# Patient Record
Sex: Female | Born: 1990 | Race: White | Hispanic: No | Marital: Married | Smoking: Never smoker
Health system: Southern US, Community
[De-identification: ages and names within clinical notes are randomized; demographics above are authoritative.]

## PROBLEM LIST (undated history)

## (undated) ENCOUNTER — Inpatient Hospital Stay (HOSPITAL_COMMUNITY): Payer: Self-pay

## (undated) DIAGNOSIS — T7840XA Allergy, unspecified, initial encounter: Secondary | ICD-10-CM

## (undated) DIAGNOSIS — F329 Major depressive disorder, single episode, unspecified: Secondary | ICD-10-CM

## (undated) DIAGNOSIS — F32A Depression, unspecified: Secondary | ICD-10-CM

## (undated) DIAGNOSIS — J45909 Unspecified asthma, uncomplicated: Secondary | ICD-10-CM

## (undated) DIAGNOSIS — R87629 Unspecified abnormal cytological findings in specimens from vagina: Secondary | ICD-10-CM

## (undated) DIAGNOSIS — B019 Varicella without complication: Secondary | ICD-10-CM

## (undated) DIAGNOSIS — R569 Unspecified convulsions: Secondary | ICD-10-CM

## (undated) DIAGNOSIS — R87619 Unspecified abnormal cytological findings in specimens from cervix uteri: Secondary | ICD-10-CM

## (undated) DIAGNOSIS — M1990 Unspecified osteoarthritis, unspecified site: Secondary | ICD-10-CM

## (undated) DIAGNOSIS — F419 Anxiety disorder, unspecified: Secondary | ICD-10-CM

## (undated) DIAGNOSIS — IMO0002 Reserved for concepts with insufficient information to code with codable children: Secondary | ICD-10-CM

## (undated) DIAGNOSIS — Z8619 Personal history of other infectious and parasitic diseases: Secondary | ICD-10-CM

## (undated) HISTORY — DX: Allergy, unspecified, initial encounter: T78.40XA

## (undated) HISTORY — DX: Reserved for concepts with insufficient information to code with codable children: IMO0002

## (undated) HISTORY — DX: Unspecified osteoarthritis, unspecified site: M19.90

## (undated) HISTORY — PX: GYNECOLOGIC CRYOSURGERY: SHX857

## (undated) HISTORY — DX: Varicella without complication: B01.9

## (undated) HISTORY — DX: Unspecified asthma, uncomplicated: J45.909

## (undated) HISTORY — DX: Personal history of other infectious and parasitic diseases: Z86.19

## (undated) HISTORY — DX: Unspecified convulsions: R56.9

## (undated) HISTORY — DX: Unspecified abnormal cytological findings in specimens from vagina: R87.629

## (undated) HISTORY — DX: Depression, unspecified: F32.A

## (undated) HISTORY — DX: Unspecified abnormal cytological findings in specimens from cervix uteri: R87.619

## (undated) HISTORY — DX: Anxiety disorder, unspecified: F41.9

## (undated) HISTORY — DX: Major depressive disorder, single episode, unspecified: F32.9

---
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
## 2010-01-02 ENCOUNTER — Encounter: Admission: RE | Admit: 2010-01-02 | Discharge: 2010-01-02 | Payer: Self-pay | Admitting: Emergency Medicine

## 2010-01-04 ENCOUNTER — Ambulatory Visit (HOSPITAL_COMMUNITY): Admission: RE | Admit: 2010-01-04 | Discharge: 2010-01-04 | Payer: Self-pay | Admitting: Emergency Medicine

## 2011-04-02 ENCOUNTER — Ambulatory Visit (INDEPENDENT_AMBULATORY_CARE_PROVIDER_SITE_OTHER): Payer: 59

## 2011-04-02 DIAGNOSIS — S93409A Sprain of unspecified ligament of unspecified ankle, initial encounter: Secondary | ICD-10-CM

## 2012-03-02 LAB — OB RESULTS CONSOLE ANTIBODY SCREEN: Antibody Screen: NEGATIVE

## 2012-03-02 LAB — OB RESULTS CONSOLE GC/CHLAMYDIA: Gonorrhea: NEGATIVE

## 2012-03-02 LAB — OB RESULTS CONSOLE ABO/RH

## 2012-03-02 LAB — OB RESULTS CONSOLE HIV ANTIBODY (ROUTINE TESTING): HIV: NONREACTIVE

## 2012-04-12 ENCOUNTER — Other Ambulatory Visit: Payer: Self-pay | Admitting: Physician Assistant

## 2012-04-12 NOTE — Telephone Encounter (Signed)
Please pull chart.

## 2012-04-13 NOTE — Telephone Encounter (Signed)
Chart pulled at [place] [id number]

## 2012-04-13 NOTE — Telephone Encounter (Signed)
Sent 1 epipen to pharmacy.  Pt needs office visit, last seen over 1 yr ago for acute visit

## 2012-09-28 LAB — OB RESULTS CONSOLE GBS: GBS: NEGATIVE

## 2012-10-04 ENCOUNTER — Encounter (HOSPITAL_COMMUNITY): Payer: Self-pay | Admitting: *Deleted

## 2012-10-04 ENCOUNTER — Telehealth (HOSPITAL_COMMUNITY): Payer: Self-pay | Admitting: *Deleted

## 2012-10-04 NOTE — Telephone Encounter (Signed)
Preadmission screen  

## 2012-10-07 ENCOUNTER — Inpatient Hospital Stay (HOSPITAL_COMMUNITY)
Admission: RE | Admit: 2012-10-07 | Discharge: 2012-10-09 | DRG: 373 | Disposition: A | Discharge status: Home / Self Care | Payer: BC Managed Care – PPO | Source: Ambulatory Visit | Attending: Obstetrics and Gynecology | Admitting: Obstetrics and Gynecology

## 2012-10-07 LAB — CBC
HCT: 32.5 % — ABNORMAL LOW (ref 36.0–46.0)
MCH: 31.2 pg (ref 26.0–34.0)
MCHC: 34.2 g/dL (ref 30.0–36.0)
RDW: 12.4 % (ref 11.5–15.5)

## 2012-10-07 LAB — ABO/RH: ABO/RH(D): A POS

## 2012-10-07 LAB — TYPE AND SCREEN: ABO/RH(D): A POS

## 2012-10-07 MED ORDER — ACETAMINOPHEN 325 MG PO TABS: 650.0000 mg | ORAL_TABLET | ORAL | Status: DC | PRN

## 2012-10-07 MED ORDER — FLEET ENEMA 7-19 GM/118ML RE ENEM: 1.0000 | ENEMA | RECTAL | Status: DC | PRN

## 2012-10-07 MED ORDER — ONDANSETRON HCL 4 MG/2ML IJ SOLN: 4.0000 mg | Freq: Four times a day (QID) | INTRAMUSCULAR | Status: DC | PRN

## 2012-10-07 MED ORDER — CITRIC ACID-SODIUM CITRATE 334-500 MG/5ML PO SOLN: 30.0000 mL | ORAL | Status: DC | PRN

## 2012-10-07 MED ORDER — LACTATED RINGERS IV SOLN
INTRAVENOUS | Status: DC
  Administered 2012-10-07 – 2012-10-08 (×3): via INTRAVENOUS

## 2012-10-07 MED ORDER — OXYTOCIN 40 UNITS IN LACTATED RINGERS INFUSION - SIMPLE MED
1.0000 m[IU]/min | INTRAVENOUS | Status: DC
  Administered 2012-10-07: 8 m[IU]/min via INTRAVENOUS
  Administered 2012-10-07: 2 m[IU]/min via INTRAVENOUS
  Administered 2012-10-07: 4 m[IU]/min via INTRAVENOUS
  Administered 2012-10-07: 1 m[IU]/min via INTRAVENOUS
  Administered 2012-10-07: 7 m[IU]/min via INTRAVENOUS
  Administered 2012-10-07: 6 m[IU]/min via INTRAVENOUS
  Administered 2012-10-07: 3 m[IU]/min via INTRAVENOUS
  Filled 2012-10-07: qty 1000

## 2012-10-07 MED ORDER — OXYCODONE-ACETAMINOPHEN 5-325 MG PO TABS: 1.0000 | ORAL_TABLET | ORAL | Status: DC | PRN

## 2012-10-07 MED ORDER — LACTATED RINGERS IV SOLN: 500.0000 mL | INTRAVENOUS | Status: DC | PRN

## 2012-10-07 MED ORDER — LIDOCAINE HCL (PF) 1 % IJ SOLN
30.0000 mL | INTRAMUSCULAR | Status: DC | PRN
Start: 2012-10-07 — End: 2012-10-09
  Filled 2012-10-07 (×2): qty 30

## 2012-10-07 MED ORDER — IBUPROFEN 600 MG PO TABS
600.0000 mg | ORAL_TABLET | Freq: Four times a day (QID) | ORAL | Status: DC | PRN
  Filled 2012-10-07 (×5): qty 1

## 2012-10-07 MED ORDER — OXYTOCIN BOLUS FROM INFUSION: 500.0000 mL | INTRAVENOUS | Status: DC

## 2012-10-07 MED ORDER — OXYTOCIN 40 UNITS IN LACTATED RINGERS INFUSION - SIMPLE MED
62.5000 mL/h | INTRAVENOUS | Status: DC
  Administered 2012-10-08: 62.5 mL/h via INTRAVENOUS

## 2012-10-07 MED ORDER — BUTORPHANOL TARTRATE 1 MG/ML IJ SOLN: 1.0000 mg | INTRAMUSCULAR | Status: DC | PRN

## 2012-10-07 MED ORDER — TERBUTALINE SULFATE 1 MG/ML IJ SOLN: 0.2500 mg | Freq: Once | INTRAMUSCULAR | Status: AC | PRN

## 2012-10-07 NOTE — Progress Notes (Signed)
FHT reactive UCs q2-3 min

## 2012-10-07 NOTE — Progress Notes (Signed)
Cx 2-3/80/-2/well applied vtx AROM clear FHT reactive UCs q65min

## 2012-10-07 NOTE — H&P (Signed)
JESLIE LOWE is a 22 y.o. female presenting for IOL at term. No HA/vision change, no epigastric pain, no leaking/bleeding. EFW on 10/04/12 = 7# 13oz. Maternal Medical History:  Fetal activity: Perceived fetal activity is normal.      OB History   Grav Para Term Preterm Abortions TAB SAB Ect Mult Living   2 1 1       1      Past Medical History  Diagnosis Date  . Abnormal Pap smear   . Hx of varicella   . Asthma   . Seizures     hx pseudotumor treated in 2011 with meds  . Depression   . Anxiety    Past Surgical History  Procedure Laterality Date  . Gynecologic cryosurgery     Family History: family history includes Diabetes in her father; Hypertension in her maternal grandfather; Other in her mother; and Rheum arthritis in her maternal grandmother. Social History:  reports that she has never smoked. She has never used smokeless tobacco. She reports that she does not drink alcohol or use illicit drugs.   Prenatal Transfer Tool  Maternal Diabetes: No Genetic Screening: Normal Maternal Ultrasounds/Referrals: Normal Fetal Ultrasounds or other Referrals:  None Maternal Substance Abuse:  No Significant Maternal Medications:  None Significant Maternal Lab Results:  None Other Comments:  None  Review of Systems  Eyes: Negative for blurred vision.  Gastrointestinal: Negative for abdominal pain.  Neurological: Positive for headaches.    Dilation: 2 Effacement (%): 50 Station: Ballotable Exam by:: Alexius Ellington MD Temperature 97.8 F (36.6 C), temperature source Oral, resp. rate 20, height 5\' 8"  (1.727 m), weight 254 lb (115.214 kg), last menstrual period 01/04/2012. Maternal Exam:  Uterine Assessment: Contraction strength is moderate.  Contraction frequency is irregular.   Abdomen: Fetal presentation: vertex     Fetal Exam Fetal Monitor Review: Pattern: accelerations present.       Physical Exam  Cardiovascular: Normal rate and regular rhythm.   Respiratory: Effort  normal and breath sounds normal.  GI: Soft. There is no tenderness.  Neurological: She has normal reflexes.   Cx  2/60/-3/vtx Prenatal labs: ABO, Rh: A/Positive/-- (12/11 0000) Antibody: Negative (12/11 0000) Rubella: Immune (12/11 0000) RPR: Nonreactive (12/11 0000)  HBsAg: Negative (12/11 0000)  HIV: Non-reactive (12/11 0000)  GBS:     Assessment/Plan: 22 yo G2P1 at term for IOL Will start pitocin , risks reviewed   Joann Jorge II,Param Capri E 10/07/2012, 8:23 AM

## 2012-10-07 NOTE — Progress Notes (Signed)
Cx 3/80/-2/vtx FHT reactive UCs q2-4, mild to palpation

## 2012-10-08 ENCOUNTER — Inpatient Hospital Stay (HOSPITAL_COMMUNITY): Payer: BC Managed Care – PPO | Admitting: Anesthesiology

## 2012-10-08 ENCOUNTER — Encounter (HOSPITAL_COMMUNITY): Payer: Self-pay

## 2012-10-08 ENCOUNTER — Encounter (HOSPITAL_COMMUNITY): Payer: Self-pay | Admitting: Anesthesiology

## 2012-10-08 MED ORDER — LACTATED RINGERS IV SOLN
500.0000 mL | Freq: Once | INTRAVENOUS | Status: AC
  Administered 2012-10-08: 500 mL via INTRAVENOUS

## 2012-10-08 MED ORDER — TETANUS-DIPHTH-ACELL PERTUSSIS 5-2.5-18.5 LF-MCG/0.5 IM SUSP: 0.5000 mL | Freq: Once | INTRAMUSCULAR | Status: DC

## 2012-10-08 MED ORDER — EPHEDRINE 5 MG/ML INJ
10.0000 mg | INTRAVENOUS | Status: DC | PRN
  Filled 2012-10-08: qty 2

## 2012-10-08 MED ORDER — LANOLIN HYDROUS EX OINT: TOPICAL_OINTMENT | CUTANEOUS | Status: DC | PRN

## 2012-10-08 MED ORDER — PRENATAL MULTIVITAMIN CH
1.0000 | ORAL_TABLET | Freq: Every day | ORAL | Status: DC
  Administered 2012-10-08 – 2012-10-09 (×2): 1 via ORAL
  Filled 2012-10-08 (×2): qty 1

## 2012-10-08 MED ORDER — EPHEDRINE 5 MG/ML INJ
10.0000 mg | INTRAVENOUS | Status: DC | PRN
  Filled 2012-10-08: qty 2
  Filled 2012-10-08: qty 4

## 2012-10-08 MED ORDER — LIDOCAINE HCL (PF) 1 % IJ SOLN
INTRAMUSCULAR | Status: DC | PRN
  Administered 2012-10-08 (×4): 4 mL

## 2012-10-08 MED ORDER — OXYCODONE-ACETAMINOPHEN 5-325 MG PO TABS: 1.0000 | ORAL_TABLET | ORAL | Status: DC | PRN

## 2012-10-08 MED ORDER — WITCH HAZEL-GLYCERIN EX PADS: 1.0000 "application " | MEDICATED_PAD | CUTANEOUS | Status: DC | PRN

## 2012-10-08 MED ORDER — PHENYLEPHRINE 40 MCG/ML (10ML) SYRINGE FOR IV PUSH (FOR BLOOD PRESSURE SUPPORT)
80.0000 ug | PREFILLED_SYRINGE | INTRAVENOUS | Status: DC | PRN
  Filled 2012-10-08: qty 2

## 2012-10-08 MED ORDER — DIPHENHYDRAMINE HCL 50 MG/ML IJ SOLN: 12.5000 mg | INTRAMUSCULAR | Status: DC | PRN

## 2012-10-08 MED ORDER — PHENYLEPHRINE 40 MCG/ML (10ML) SYRINGE FOR IV PUSH (FOR BLOOD PRESSURE SUPPORT)
80.0000 ug | PREFILLED_SYRINGE | INTRAVENOUS | Status: DC | PRN
  Filled 2012-10-08: qty 5
  Filled 2012-10-08: qty 2

## 2012-10-08 MED ORDER — ONDANSETRON HCL 4 MG/2ML IJ SOLN: 4.0000 mg | INTRAMUSCULAR | Status: DC | PRN

## 2012-10-08 MED ORDER — DIPHENHYDRAMINE HCL 25 MG PO CAPS: 25.0000 mg | ORAL_CAPSULE | Freq: Four times a day (QID) | ORAL | Status: DC | PRN

## 2012-10-08 MED ORDER — ONDANSETRON HCL 4 MG PO TABS: 4.0000 mg | ORAL_TABLET | ORAL | Status: DC | PRN

## 2012-10-08 MED ORDER — SIMETHICONE 80 MG PO CHEW: 80.0000 mg | CHEWABLE_TABLET | ORAL | Status: DC | PRN

## 2012-10-08 MED ORDER — ZOLPIDEM TARTRATE 5 MG PO TABS: 5.0000 mg | ORAL_TABLET | Freq: Every evening | ORAL | Status: DC | PRN

## 2012-10-08 MED ORDER — SENNOSIDES-DOCUSATE SODIUM 8.6-50 MG PO TABS
2.0000 | ORAL_TABLET | Freq: Every day | ORAL | Status: DC
  Administered 2012-10-08: 2 via ORAL

## 2012-10-08 MED ORDER — IBUPROFEN 600 MG PO TABS
600.0000 mg | ORAL_TABLET | Freq: Four times a day (QID) | ORAL | Status: DC
  Administered 2012-10-08 – 2012-10-09 (×5): 600 mg via ORAL

## 2012-10-08 MED ORDER — FENTANYL 2.5 MCG/ML BUPIVACAINE 1/10 % EPIDURAL INFUSION (WH - ANES)
14.0000 mL/h | INTRAMUSCULAR | Status: DC | PRN
  Administered 2012-10-08: 14 mL/h via EPIDURAL
  Filled 2012-10-08: qty 125

## 2012-10-08 MED ORDER — FLEET ENEMA 7-19 GM/118ML RE ENEM: 1.0000 | ENEMA | Freq: Every day | RECTAL | Status: DC | PRN

## 2012-10-08 MED ORDER — BENZOCAINE-MENTHOL 20-0.5 % EX AERO: 1.0000 "application " | INHALATION_SPRAY | CUTANEOUS | Status: DC | PRN

## 2012-10-08 MED ORDER — DIBUCAINE 1 % RE OINT: 1.0000 "application " | TOPICAL_OINTMENT | RECTAL | Status: DC | PRN

## 2012-10-08 MED ORDER — BISACODYL 10 MG RE SUPP: 10.0000 mg | Freq: Every day | RECTAL | Status: DC | PRN

## 2012-10-08 NOTE — Anesthesia Preprocedure Evaluation (Signed)
Anesthesia Evaluation  Patient identified by MRN, date of birth, ID band Patient awake    Reviewed: Allergy & Precautions, H&P , NPO status , Patient's Chart, lab work & pertinent test results, reviewed documented beta blocker date and time   History of Anesthesia Complications Negative for: history of anesthetic complications  Airway Mallampati: I TM Distance: >3 FB Neck ROM: full    Dental  (+) Teeth Intact   Pulmonary  breath sounds clear to auscultation        Cardiovascular negative cardio ROS  - dysrhythmias Rhythm:regular Rate:Normal     Neuro/Psych PSYCHIATRIC DISORDERS (depression, anxiety) H/o pseudotumor cerebri - no problems in 2 years    GI/Hepatic negative GI ROS, Neg liver ROS,   Endo/Other  BMI 38.7  Renal/GU negative Renal ROS     Musculoskeletal   Abdominal   Peds  Hematology negative hematology ROS (+)   Anesthesia Other Findings   Reproductive/Obstetrics (+) Pregnancy                           Anesthesia Physical Anesthesia Plan  ASA: II  Anesthesia Plan: Epidural   Post-op Pain Management:    Induction:   Airway Management Planned:   Additional Equipment:   Intra-op Plan:   Post-operative Plan:   Informed Consent: I have reviewed the patients History and Physical, chart, labs and discussed the procedure including the risks, benefits and alternatives for the proposed anesthesia with the patient or authorized representative who has indicated his/her understanding and acceptance.     Plan Discussed with:   Anesthesia Plan Comments:         Anesthesia Quick Evaluation

## 2012-10-08 NOTE — Progress Notes (Signed)
Delivery Note At 6:41 AM a viable female was delivered via Vaginal, Spontaneous Delivery (Presentation: ; Occiput Anterior).  APGAR:9 ,9 ; weight pending .   Placenta status: Intact, Spontaneous.  Cord: 3 vessels with the following complications: None.  Cord pH: art pending  Anesthesia: Epidural  Episiotomy: none Lacerations: second degree midline repaired, first degree bilat periurethral not bleeding, not repaired Suture Repair: vicryl rapide Est. Blood Loss (mL): 350  Mom to postpartum.  Baby to nursery-stable.  Joyice Magda II,Jahki Witham E 10/08/2012, 6:55 AM

## 2012-10-08 NOTE — Anesthesia Procedure Notes (Signed)
Epidural Patient location during procedure: OB Start time: 10/08/2012 3:12 AM  Staffing Performed by: anesthesiologist   Preanesthetic Checklist Completed: patient identified, site marked, surgical consent, pre-op evaluation, timeout performed, IV checked, risks and benefits discussed and monitors and equipment checked  Epidural Patient position: sitting Prep: site prepped and draped and DuraPrep Patient monitoring: continuous pulse ox and blood pressure Approach: midline Injection technique: LOR air  Needle:  Needle type: Tuohy  Needle gauge: 17 G Needle length: 9 cm and 9 Needle insertion depth: 7 cm Catheter type: closed end flexible Catheter size: 19 Gauge Catheter at skin depth: 12 cm Test dose: negative  Assessment Events: blood not aspirated, injection not painful, no injection resistance, negative IV test and paresthesia (transient left leg)  Additional Notes Discussed risk of headache, infection, bleeding, nerve injury and failed or incomplete block.  Patient voices understanding and wishes to proceed.   Epidural placed easily on first attempt.  Transient left leg paresthesia.  Patient tolerated procedure well with no apparent complications.  Jasmine December, MD Reason for block:procedure for pain

## 2012-10-09 LAB — CBC
HCT: 28.8 % — ABNORMAL LOW (ref 36.0–46.0)
MCH: 31.5 pg (ref 26.0–34.0)
MCHC: 34 g/dL (ref 30.0–36.0)
MCV: 92.6 fL (ref 78.0–100.0)
RDW: 12.7 % (ref 11.5–15.5)
WBC: 11.7 10*3/uL — ABNORMAL HIGH (ref 4.0–10.5)

## 2012-10-09 MED ORDER — BENZOCAINE-MENTHOL 20-0.5 % EX AERO: 1.0000 "application " | INHALATION_SPRAY | CUTANEOUS | Status: DC | PRN

## 2012-10-09 MED ORDER — IBUPROFEN 600 MG PO TABS: 600.0000 mg | ORAL_TABLET | Freq: Four times a day (QID) | ORAL | Status: DC | PRN

## 2012-10-09 MED ORDER — OXYCODONE-ACETAMINOPHEN 5-325 MG PO TABS: 1.0000 | ORAL_TABLET | Freq: Four times a day (QID) | ORAL | Status: DC | PRN

## 2012-10-09 NOTE — Progress Notes (Signed)
Post Partum Day 1 Subjective: no complaints, up ad lib, voiding, tolerating PO and + flatus Wants to go home  Objective: Blood pressure 121/76, pulse 73, temperature 98.3 F (36.8 C), temperature source Oral, resp. rate 18, height 5\' 8"  (1.727 m), weight 254 lb (115.214 kg), last menstrual period 01/04/2012, SpO2 100.00%, unknown if currently breastfeeding.  Physical Exam:  General: alert, cooperative and no distress Lochia: appropriate Uterine Fundus: firm Incision: healing well DVT Evaluation: No evidence of DVT seen on physical exam.   Recent Labs  10/07/12 0740 10/09/12 0610  HGB 11.1* 9.8*  HCT 32.5* 28.8*    Assessment/Plan: Discharge home   LOS: 2 days   Caydence Koenig II,Jaeda Bruso E 10/09/2012, 9:06 AM

## 2012-10-09 NOTE — Clinical Social Work Note (Signed)
CSW spoke with MOB in room about hx of anxiety/depression.  MOB reports she has symptoms however it's only situational.  MOB does not feel she needs medication management or counseling at this time.  No current concerns.    Patient was referred for history of depression/anxiety.  * Referral screened out by Clinical Social Worker because none of the following criteria appear to apply: ~ History of anxiety/depression during this pregnancy, or of post-partum depression. ~ Diagnosis of anxiety and/or depression within last 3 years ~ History of depression due to pregnancy loss/loss of child  OR  * Patient's symptoms currently being treated with medication and/or therapy.  Please contact the Clinical Social Worker if needs arise, or by the patient's request. [phone] 

## 2012-10-09 NOTE — Anesthesia Postprocedure Evaluation (Signed)
Anesthesia Post Note  Patient: Carrie Thompson  Procedure(s) Performed: * No procedures listed *  Anesthesia type: Epidural  Patient location: Mother/Baby  Post pain: Pain level controlled  Post assessment: Post-op Vital signs reviewed  Last Vitals:  Filed Vitals:   10/09/12 0611  BP: 121/76  Pulse: 73  Temp: 36.8 C  Resp: 18    Post vital signs: Reviewed  Level of consciousness:alert  Complications: No apparent anesthesia complications Anesthesia Post-op Note  Patient: [name]  Procedure(s) Performed: * No procedures listed *  Patient Location: PACU and Mother/Baby  Anesthesia Type:Epidural  Level of Consciousness: awake  Airway and Oxygen Therapy: Patient Spontanous Breathing  Post-op Pain: none  Post-op Assessment: Post-op Vital signs reviewed  Post-op Vital Signs: Reviewed  Complications: No apparent anesthesia complications

## 2012-10-10 ENCOUNTER — Inpatient Hospital Stay (HOSPITAL_COMMUNITY): Admission: AD | Admit: 2012-10-10 | Payer: Self-pay | Source: Ambulatory Visit | Admitting: Obstetrics and Gynecology

## 2012-10-13 NOTE — Discharge Summary (Signed)
Obstetric Discharge Summary Reason for Admission: induction of labor Prenatal Procedures: ultrasound Intrapartum Procedures: spontaneous vaginal delivery Postpartum Procedures: none Complications-Operative and Postpartum: 1 degree perineal laceration Hemoglobin  Date Value Range Status  10/09/2012 9.8* 12.0 - 15.0 Thompson/dL Final     HCT  Date Value Range Status  10/09/2012 28.8* 36.0 - 46.0 % Final    Physical Exam:  General: alert and cooperative Lochia: appropriate Uterine Fundus: firm Incision: perineum intact DVT Evaluation: No evidence of DVT seen on physical exam. Negative Homan's sign. No cords or calf tenderness. No significant calf/ankle edema.  Discharge Diagnoses: Term Pregnancy-delivered  Discharge Information: Date: 10/13/2012 Activity: pelvic rest Diet: routine Medications: PNV and Ibuprofen Condition: stable Instructions: refer to practice specific booklet Discharge to: home   Newborn Data: Live born female  Birth Weight: 7 lb 10.9 oz ([id number] Thompson) APGAR: 9, 9  Home with mother.  Carrie Thompson 10/13/2012, 9:01 AM

## 2013-10-23 LAB — OB RESULTS CONSOLE ABO/RH: RH Type: POSITIVE

## 2013-10-23 LAB — OB RESULTS CONSOLE GC/CHLAMYDIA
CHLAMYDIA, DNA PROBE: NEGATIVE
Gonorrhea: NEGATIVE

## 2013-10-23 LAB — OB RESULTS CONSOLE HIV ANTIBODY (ROUTINE TESTING): HIV: NONREACTIVE

## 2013-10-23 LAB — OB RESULTS CONSOLE HEPATITIS B SURFACE ANTIGEN: HEP B S AG: NEGATIVE

## 2013-10-23 LAB — OB RESULTS CONSOLE RPR: RPR: NONREACTIVE

## 2013-10-23 LAB — OB RESULTS CONSOLE ANTIBODY SCREEN: Antibody Screen: NEGATIVE

## 2013-10-23 LAB — OB RESULTS CONSOLE RUBELLA ANTIBODY, IGM: RUBELLA: NON-IMMUNE/NOT IMMUNE

## 2014-01-22 ENCOUNTER — Encounter (HOSPITAL_COMMUNITY): Payer: Self-pay

## 2014-03-23 NOTE — L&D Delivery Note (Signed)
Delivery Note At 8:19 PM a viable female was delivered via Vaginal, Spontaneous Delivery (Presentation: ;  ).  APGAR:9 9 weight pending  Placenta status: Intact, Spontaneous.  Cord: 3 vessels with the following complications: None.  Cord pH: not obtained  Anesthesia:  none Episiotomy:  None Lacerations:  first Suture Repair: 3.0 chromic Est. Blood Loss (mL):  300  Mom to postpartum.  Baby to Couplet care / Skin to Skin.  Karlina Suares L 06/01/2014, 8:29 PM

## 2014-04-11 ENCOUNTER — Ambulatory Visit: Payer: BC Managed Care – PPO | Admitting: Internal Medicine

## 2014-05-19 ENCOUNTER — Encounter (HOSPITAL_COMMUNITY): Payer: Self-pay

## 2014-05-19 ENCOUNTER — Inpatient Hospital Stay (HOSPITAL_COMMUNITY)
Admission: AD | Admit: 2014-05-19 | Discharge: 2014-05-19 | Disposition: A | Discharge status: Home / Self Care | Payer: 59 | Source: Ambulatory Visit | Attending: Obstetrics and Gynecology | Admitting: Obstetrics and Gynecology

## 2014-05-19 DIAGNOSIS — O9989 Other specified diseases and conditions complicating pregnancy, childbirth and the puerperium: Secondary | ICD-10-CM | POA: Diagnosis not present

## 2014-05-19 DIAGNOSIS — O26893 Other specified pregnancy related conditions, third trimester: Secondary | ICD-10-CM | POA: Diagnosis not present

## 2014-05-19 DIAGNOSIS — Z3A39 39 weeks gestation of pregnancy: Secondary | ICD-10-CM | POA: Insufficient documentation

## 2014-05-19 DIAGNOSIS — R000 Tachycardia, unspecified: Secondary | ICD-10-CM | POA: Insufficient documentation

## 2014-05-19 DIAGNOSIS — O479 False labor, unspecified: Secondary | ICD-10-CM

## 2014-05-19 DIAGNOSIS — R51 Headache: Secondary | ICD-10-CM | POA: Insufficient documentation

## 2014-05-19 LAB — COMPREHENSIVE METABOLIC PANEL
ALBUMIN: 2.6 g/dL — AB (ref 3.5–5.2)
ALK PHOS: 167 U/L — AB (ref 39–117)
ALT: 12 U/L (ref 0–35)
AST: 19 U/L (ref 0–37)
Anion gap: 3 — ABNORMAL LOW (ref 5–15)
BUN: 7 mg/dL (ref 6–23)
CALCIUM: 7.9 mg/dL — AB (ref 8.4–10.5)
CHLORIDE: 110 mmol/L (ref 96–112)
CO2: 20 mmol/L (ref 19–32)
CREATININE: 0.57 mg/dL (ref 0.50–1.10)
GFR calc Af Amer: >90 mL/min (ref >90)
GLUCOSE: 87 mg/dL (ref 70–99)
POTASSIUM: 3.8 mmol/L (ref 3.5–5.1)
SODIUM: 133 mmol/L — AB (ref 135–145)
Total Bilirubin: 0.9 mg/dL (ref 0.3–1.2)
Total Protein: 5.8 g/dL — ABNORMAL LOW (ref 6.0–8.3)

## 2014-05-19 LAB — CBC
HCT: 28 % — ABNORMAL LOW (ref 36.0–46.0)
Hemoglobin: 9.3 g/dL — ABNORMAL LOW (ref 12.0–15.0)
MCH: 28.8 pg (ref 26.0–34.0)
MCHC: 33.2 g/dL (ref 30.0–36.0)
MCV: 86.7 fL (ref 78.0–100.0)
Platelets: 218 10*3/uL (ref 150–400)
RBC: 3.23 MIL/uL — ABNORMAL LOW (ref 3.87–5.11)
RDW: 14.2 % (ref 11.5–15.5)
WBC: 10.9 10*3/uL — ABNORMAL HIGH (ref 4.0–10.5)

## 2014-05-19 LAB — URINALYSIS, ROUTINE W REFLEX MICROSCOPIC
Bilirubin Urine: NEGATIVE
GLUCOSE, UA: NEGATIVE mg/dL
Hgb urine dipstick: NEGATIVE
Ketones, ur: 15 mg/dL — AB
Nitrite: NEGATIVE
PH: 6.5 (ref 5.0–8.0)
Protein, ur: NEGATIVE mg/dL
SPECIFIC GRAVITY, URINE: 1.02 (ref 1.005–1.030)
UROBILINOGEN UA: 1 mg/dL (ref 0.0–1.0)

## 2014-05-19 LAB — LACTATE DEHYDROGENASE: LDH: 135 U/L (ref 94–250)

## 2014-05-19 LAB — URINE MICROSCOPIC-ADD ON

## 2014-05-19 LAB — URIC ACID: URIC ACID, SERUM: 4.4 mg/dL (ref 2.4–7.0)

## 2014-05-19 NOTE — MAU Note (Signed)
Pt states n/v last pm. Having chills/headache/body aches, and irregular contractions. No bleeding or lof.

## 2014-05-19 NOTE — Discharge Instructions (Signed)
Return if your labor symtoms worsen or if your water breaks. Take Tylenol 325 mg 2 tablets by mouth every 4 hours if needed for pain. Drink at least 8 8-oz glasses of water every day. Keep your appointments in the office.

## 2014-05-19 NOTE — MAU Provider Note (Signed)
History     CSN: [id number]  Arrival date and time: 05/19/14 1058   First Provider Initiated Contact with Patient 05/19/14 1207      Chief Complaint  Patient presents with  . Emesis   HPI Carrie Thompson 23 y.o. [redacted]w[redacted]d   Comes to MAU with feeling band and has a headache.  Called the on call line and was told to come in.  Had [facility] labs in the office on Tuesday.  Had some nausea yesterday and vomited once.  No diarrhea.  Has had periodic blood pressures which spiked  in the office but on recheck would be normal.  OB History    Gravida Para Term Preterm AB TAB SAB Ectopic Multiple Living   3 2 2       2       Past Medical History  Diagnosis Date  . Abnormal Pap smear   . Hx of varicella   . Asthma   . Seizures     hx pseudotumor treated in 2011 with meds  . Depression   . Anxiety     Past Surgical History  Procedure Laterality Date  . Gynecologic cryosurgery      Family History  Problem Relation Age of Onset  . Other Mother     varicose veins  . Diabetes Father   . Rheum arthritis Maternal Grandmother   . Hypertension Maternal Grandfather     History  Substance Use Topics  . Smoking status: Never Smoker   . Smokeless tobacco: Never Used  . Alcohol Use: No    Allergies:  Allergies  Allergen Reactions  . Shellfish Allergy Anaphylaxis  . Iodine Other (See Comments)    Pt states that she avoids iodine because of her reaction to shellfish.      Prescriptions prior to admission  Medication Sig Dispense Refill Last Dose  . Prenatal Vit-Fe Fumarate-FA (PRENATAL MULTIVITAMIN) TABS Take 1 tablet by mouth daily.    05/18/2014 at Unknown time  . benzocaine-Menthol (DERMOPLAST) 20-0.5 % AERO Apply 1 application topically as needed (perineal discomfort). (Patient not taking: Reported on 05/19/2014) 56 g 2   . ibuprofen (ADVIL,MOTRIN) 600 MG tablet Take 1 tablet (600 mg total) by mouth every 6 (six) hours as needed (pain scale < 4). (Patient not taking: Reported on  05/19/2014) 30 tablet 0   . oxyCODONE-acetaminophen (PERCOCET/ROXICET) 5-325 MG per tablet Take 1-2 tablets by mouth every 6 (six) hours as needed. (Patient not taking: Reported on 05/19/2014) 20 tablet 0     Review of Systems  Constitutional: Negative for fever.       Feels flushed periodically   Gastrointestinal: Negative for heartburn, nausea, vomiting, abdominal pain, diarrhea and constipation.  Genitourinary:       No vaginal discharge. No vaginal bleeding. No dysuria.  Neurological: Positive for headaches.   Physical Exam   Blood pressure 150/95, pulse 157, temperature 98.4 F (36.9 C), temperature source Oral, resp. rate 18, height 5\' 7"  (1.702 m), weight 268 lb 4 oz (121.677 kg), not currently breastfeeding.  Physical Exam  Nursing note and vitals reviewed. Constitutional: She is oriented to person, place, and time. She appears well-developed and well-nourished.  HENT:  Head: Normocephalic.  Eyes: EOM are normal.  Neck: Neck supple.  GI: Soft. There is no tenderness.  FHT baseline 150 with moderate variability.  Accels 15x15 noted - reactive strip.  Irregular contraction palpated but not printing on the monitor strip.  Genitourinary:  Cervical exam by the nurse 1 cm and  thick.  Musculoskeletal: Normal range of motion.  Trace edema in ankles bilaterally  Neurological: She is alert and oriented to person, place, and time.  Skin: Skin is warm and dry.  Psychiatric: She has a normal mood and affect.    MAU Course  Procedures  MDM 1315  Discussed care with Dr. Radene Knee.  Due to one elevated BP150/88 and tachycardia, will get PIH labs.  BP currently below 140/90.  Results for orders placed or performed during the hospital encounter of 05/19/14 (from the past 24 hour(s))  Urinalysis, Routine w reflex microscopic     Status: Abnormal   Collection Time: 05/19/14 11:14 AM  Result Value Ref Range   Color, Urine YELLOW YELLOW   APPearance CLEAR CLEAR   Specific Gravity, Urine  1.020 1.005 - 1.030   pH 6.5 5.0 - 8.0   Glucose, UA NEGATIVE NEGATIVE mg/dL   Hgb urine dipstick NEGATIVE NEGATIVE   Bilirubin Urine NEGATIVE NEGATIVE   Ketones, ur 15 (A) NEGATIVE mg/dL   Protein, ur NEGATIVE NEGATIVE mg/dL   Urobilinogen, UA 1.0 0.0 - 1.0 mg/dL   Nitrite NEGATIVE NEGATIVE   Leukocytes, UA SMALL (A) NEGATIVE  Urine microscopic-add on     Status: Abnormal   Collection Time: 05/19/14 11:14 AM  Result Value Ref Range   Squamous Epithelial / LPF MANY (A) RARE   WBC, UA 7-10 <3 WBC/hpf   RBC / HPF 0-2 <3 RBC/hpf   Bacteria, UA MANY (A) RARE  CBC     Status: Abnormal   Collection Time: 05/19/14  1:26 PM  Result Value Ref Range   WBC 10.9 (H) 4.0 - 10.5 K/uL   RBC 3.23 (L) 3.87 - 5.11 MIL/uL   Hemoglobin 9.3 (L) 12.0 - 15.0 g/dL   HCT 28.0 (L) 36.0 - 46.0 %   MCV 86.7 78.0 - 100.0 fL   MCH 28.8 26.0 - 34.0 pg   MCHC 33.2 30.0 - 36.0 g/dL   RDW 14.2 11.5 - 15.5 %   Platelets 218 150 - 400 K/uL  Comprehensive metabolic panel     Status: Abnormal   Collection Time: 05/19/14  1:26 PM  Result Value Ref Range   Sodium 133 (L) 135 - 145 mmol/L   Potassium 3.8 3.5 - 5.1 mmol/L   Chloride 110 96 - 112 mmol/L   CO2 20 19 - 32 mmol/L   Glucose, Bld 87 70 - 99 mg/dL   BUN 7 6 - 23 mg/dL   Creatinine, Ser 0.57 0.50 - 1.10 mg/dL   Calcium 7.9 (L) 8.4 - 10.5 mg/dL   Total Protein 5.8 (L) 6.0 - 8.3 g/dL   Albumin 2.6 (L) 3.5 - 5.2 g/dL   AST 19 0 - 37 U/L   ALT 12 0 - 35 U/L   Alkaline Phosphatase 167 (H) 39 - 117 U/L   Total Bilirubin 0.9 0.3 - 1.2 mg/dL   GFR calc non Af Amer >90 >90 mL/min   GFR calc Af Amer >90 >90 mL/min   Anion gap 3 (L) 5 - 15  Lactate dehydrogenase     Status: None   Collection Time: 05/19/14  1:26 PM  Result Value Ref Range   LDH 135 94 - 250 U/L  Uric acid     Status: None   Collection Time: 05/19/14  1:26 PM  Result Value Ref Range   Uric Acid, Serum 4.4 2.4 - 7.0 mg/dL     Assessment and Plan  Not in labor Reactive NST PIH labs  done with normal results. Tachycardia improved with rest in bed.  Plan Call your doctor if your symptoms worsen Return if your water breaks or if contractions become labor. Keep your appointment in the office on Monday. Eat regular meals today and take Tylenol for headache.  Expect headache to improve.  (headache is mild and client declines any pain medication at present.)  Carrie Thompson 05/19/2014, 12:16 PM

## 2014-05-30 ENCOUNTER — Telehealth (HOSPITAL_COMMUNITY): Payer: Self-pay | Admitting: *Deleted

## 2014-05-30 ENCOUNTER — Encounter (HOSPITAL_COMMUNITY): Payer: Self-pay | Admitting: *Deleted

## 2014-05-30 LAB — OB RESULTS CONSOLE GBS: STREP GROUP B AG: NEGATIVE

## 2014-05-30 NOTE — Telephone Encounter (Signed)
Preadmission screen  

## 2014-05-31 ENCOUNTER — Ambulatory Visit: Payer: BC Managed Care – PPO | Admitting: Internal Medicine

## 2014-05-31 ENCOUNTER — Encounter (HOSPITAL_COMMUNITY): Payer: Self-pay

## 2014-05-31 ENCOUNTER — Inpatient Hospital Stay (HOSPITAL_COMMUNITY)
Admission: RE | Admit: 2014-05-31 | Discharge: 2014-06-03 | DRG: 775 | Disposition: A | Discharge status: Home / Self Care | Payer: 59 | Source: Ambulatory Visit | Attending: Obstetrics and Gynecology | Admitting: Obstetrics and Gynecology

## 2014-05-31 ENCOUNTER — Inpatient Hospital Stay (HOSPITAL_COMMUNITY): Admission: RE | Admit: 2014-05-31 | Payer: 59 | Source: Ambulatory Visit

## 2014-05-31 DIAGNOSIS — F329 Major depressive disorder, single episode, unspecified: Secondary | ICD-10-CM | POA: Diagnosis present

## 2014-05-31 DIAGNOSIS — O9952 Diseases of the respiratory system complicating childbirth: Secondary | ICD-10-CM | POA: Diagnosis present

## 2014-05-31 DIAGNOSIS — O480 Post-term pregnancy: Principal | ICD-10-CM | POA: Diagnosis present

## 2014-05-31 DIAGNOSIS — Z3A40 40 weeks gestation of pregnancy: Secondary | ICD-10-CM | POA: Diagnosis present

## 2014-05-31 DIAGNOSIS — F419 Anxiety disorder, unspecified: Secondary | ICD-10-CM | POA: Diagnosis present

## 2014-05-31 DIAGNOSIS — J45909 Unspecified asthma, uncomplicated: Secondary | ICD-10-CM | POA: Diagnosis present

## 2014-05-31 DIAGNOSIS — O99344 Other mental disorders complicating childbirth: Secondary | ICD-10-CM | POA: Diagnosis present

## 2014-05-31 DIAGNOSIS — Z3490 Encounter for supervision of normal pregnancy, unspecified, unspecified trimester: Secondary | ICD-10-CM

## 2014-05-31 LAB — TYPE AND SCREEN
ABO/RH(D): A POS
ANTIBODY SCREEN: NEGATIVE

## 2014-05-31 LAB — CBC
HCT: 27.6 % — ABNORMAL LOW (ref 36.0–46.0)
Hemoglobin: 8.9 g/dL — ABNORMAL LOW (ref 12.0–15.0)
MCH: 27.8 pg (ref 26.0–34.0)
MCHC: 32.2 g/dL (ref 30.0–36.0)
MCV: 86.3 fL (ref 78.0–100.0)
PLATELETS: 301 10*3/uL (ref 150–400)
RBC: 3.2 MIL/uL — AB (ref 3.87–5.11)
RDW: 14.5 % (ref 11.5–15.5)
WBC: 12.3 10*3/uL — AB (ref 4.0–10.5)

## 2014-05-31 LAB — COMPREHENSIVE METABOLIC PANEL
ALK PHOS: 204 U/L — AB (ref 39–117)
ALT: 17 U/L (ref 0–35)
ANION GAP: 12 (ref 5–15)
AST: 21 U/L (ref 0–37)
Albumin: 2.8 g/dL — ABNORMAL LOW (ref 3.5–5.2)
BUN: 12 mg/dL (ref 6–23)
CHLORIDE: 105 mmol/L (ref 96–112)
CO2: 22 mmol/L (ref 19–32)
CREATININE: 0.61 mg/dL (ref 0.50–1.10)
Calcium: 8.4 mg/dL (ref 8.4–10.5)
GFR calc Af Amer: >90 mL/min (ref >90)
GFR calc non Af Amer: >90 mL/min (ref >90)
GLUCOSE: 105 mg/dL — AB (ref 70–99)
Potassium: 4.1 mmol/L (ref 3.5–5.1)
SODIUM: 139 mmol/L (ref 135–145)
TOTAL PROTEIN: 6.1 g/dL (ref 6.0–8.3)
Total Bilirubin: 0.7 mg/dL (ref 0.3–1.2)

## 2014-05-31 MED ORDER — TERBUTALINE SULFATE 1 MG/ML IJ SOLN: 0.2500 mg | Freq: Once | INTRAMUSCULAR | Status: AC | PRN

## 2014-05-31 MED ORDER — OXYCODONE-ACETAMINOPHEN 5-325 MG PO TABS: 2.0000 | ORAL_TABLET | ORAL | Status: DC | PRN

## 2014-05-31 MED ORDER — ZOLPIDEM TARTRATE 5 MG PO TABS
5.0000 mg | ORAL_TABLET | Freq: Every evening | ORAL | Status: DC | PRN
  Administered 2014-05-31: 5 mg via ORAL
  Filled 2014-05-31 (×2): qty 1

## 2014-05-31 MED ORDER — OXYCODONE-ACETAMINOPHEN 5-325 MG PO TABS: 1.0000 | ORAL_TABLET | ORAL | Status: DC | PRN

## 2014-05-31 MED ORDER — CITRIC ACID-SODIUM CITRATE 334-500 MG/5ML PO SOLN: 30.0000 mL | ORAL | Status: DC | PRN

## 2014-05-31 MED ORDER — ACETAMINOPHEN 325 MG PO TABS: 650.0000 mg | ORAL_TABLET | ORAL | Status: DC | PRN

## 2014-05-31 MED ORDER — MISOPROSTOL 25 MCG QUARTER TABLET
25.0000 ug | ORAL_TABLET | ORAL | Status: DC | PRN
  Administered 2014-05-31 – 2014-06-01 (×2): 25 ug via VAGINAL
  Filled 2014-05-31: qty 1
  Filled 2014-05-31 (×2): qty 0.25

## 2014-05-31 MED ORDER — OXYTOCIN 40 UNITS IN LACTATED RINGERS INFUSION - SIMPLE MED
62.5000 mL/h | INTRAVENOUS | Status: DC
  Filled 2014-05-31: qty 1000

## 2014-05-31 MED ORDER — FLEET ENEMA 7-19 GM/118ML RE ENEM: 1.0000 | ENEMA | RECTAL | Status: DC | PRN

## 2014-05-31 MED ORDER — ONDANSETRON HCL 4 MG/2ML IJ SOLN: 4.0000 mg | Freq: Four times a day (QID) | INTRAMUSCULAR | Status: DC | PRN

## 2014-05-31 MED ORDER — LACTATED RINGERS IV SOLN: 500.0000 mL | INTRAVENOUS | Status: DC | PRN

## 2014-05-31 MED ORDER — LIDOCAINE HCL (PF) 1 % IJ SOLN
30.0000 mL | INTRAMUSCULAR | Status: DC | PRN
  Filled 2014-05-31: qty 30

## 2014-05-31 MED ORDER — LACTATED RINGERS IV SOLN
INTRAVENOUS | Status: DC
  Administered 2014-05-31 – 2014-06-01 (×2): via INTRAVENOUS

## 2014-05-31 MED ORDER — OXYTOCIN BOLUS FROM INFUSION: 500.0000 mL | INTRAVENOUS | Status: DC

## 2014-06-01 ENCOUNTER — Encounter (HOSPITAL_COMMUNITY): Payer: Self-pay

## 2014-06-01 LAB — RPR: RPR Ser Ql: NONREACTIVE

## 2014-06-01 MED ORDER — TERBUTALINE SULFATE 1 MG/ML IJ SOLN
0.2500 mg | Freq: Once | INTRAMUSCULAR | Status: AC | PRN
Start: 2014-06-01 — End: 2014-06-01
  Filled 2014-06-01: qty 1

## 2014-06-01 MED ORDER — BUTORPHANOL TARTRATE 1 MG/ML IJ SOLN: 1.0000 mg | Freq: Once | INTRAMUSCULAR | Status: DC

## 2014-06-01 MED ORDER — IBUPROFEN 600 MG PO TABS
600.0000 mg | ORAL_TABLET | Freq: Four times a day (QID) | ORAL | Status: DC
  Administered 2014-06-01 – 2014-06-03 (×6): 600 mg via ORAL
  Filled 2014-06-01 (×6): qty 1

## 2014-06-01 MED ORDER — OXYTOCIN 40 UNITS IN LACTATED RINGERS INFUSION - SIMPLE MED
1.0000 m[IU]/min | INTRAVENOUS | Status: DC
  Administered 2014-06-01: 2 m[IU]/min via INTRAVENOUS
  Filled 2014-06-01: qty 1000

## 2014-06-01 NOTE — Plan of Care (Signed)
Problem: Consults Goal: Birthing Suites Patient Information Press F2 to bring up selections list  Outcome: Completed/Met Date Met:  06/01/14  Pt > [redacted] weeks EGA and Inpatient induction

## 2014-06-01 NOTE — H&P (Signed)
Carrie Thompson is a 24 y.o. G 3 P 2 at 40 w 6 days presents for post dates induction. Received 2 doses of cytotec last night. History OB History    Gravida Para Term Preterm AB TAB SAB Ectopic Multiple Living   3 2 2       2      Past Medical History  Diagnosis Date  . Abnormal Pap smear   . Hx of varicella   . Asthma   . Depression   . Anxiety   . Vaginal Pap smear, abnormal   . Seizures     hx pseudotumor treated in 2011 with meds, never had seizure   Past Surgical History  Procedure Laterality Date  . Gynecologic cryosurgery     Family History: family history includes Diabetes in her father; Hypertension in her maternal grandfather; Other in her mother; Rheum arthritis in her maternal grandmother; Varicose Veins in her mother. Social History:  reports that she has never smoked. She has never used smokeless tobacco. She reports that she does not drink alcohol or use illicit drugs.   Prenatal Transfer Tool  Maternal Diabetes: No Genetic Screening: Normal Maternal Ultrasounds/Referrals: Normal Fetal Ultrasounds or other Referrals:  None Maternal Substance Abuse:  No Significant Maternal Medications:  None Significant Maternal Lab Results:  None Other Comments:  None  Review of Systems  All other systems reviewed and are negative.   Dilation: 1 Effacement (%): 80 Station: -2 Exam by:: Dr. Helane Rima Blood pressure 117/62, pulse 89, temperature 97.8 F (36.6 C), temperature source Oral, resp. rate 18, height 5\' 7"  (1.702 m), weight 270 lb (122.471 kg), not currently breastfeeding. Maternal Exam:  Uterine Assessment: Contraction strength is mild.  Contraction frequency is irregular.   Abdomen: Fetal presentation: vertex  Introitus: Normal vulva.   Fetal Exam Fetal State Assessment: Category I - tracings are normal.     Physical Exam  Vitals reviewed. Constitutional: She appears well-developed.  Eyes: Pupils are equal, round, and reactive to light.  Neck: Normal  range of motion.  Cardiovascular: Normal rate and regular rhythm.   Respiratory: Effort normal.    Prenatal labs: ABO, Rh: --/--/A POS (03/10 2050) Antibody: NEG (03/10 2050) Rubella: non immune (08/03 0000) RPR: Nonreactive (08/03 0000)  HBsAg: Negative (08/03 0000)  HIV: Non-reactive (08/03 0000)  GBS: Negative (03/09 0000)   Assessment/Plan: Post dates induction Start Pitocin augmentation - risks reviewed with patient AROM when cervical dilation progresses  Carrie Thompson L 06/01/2014, 7:53 AM

## 2014-06-02 LAB — CBC
HEMATOCRIT: 24.3 % — AB (ref 36.0–46.0)
Hemoglobin: 7.8 g/dL — ABNORMAL LOW (ref 12.0–15.0)
MCH: 27.5 pg (ref 26.0–34.0)
MCHC: 32.1 g/dL (ref 30.0–36.0)
MCV: 85.6 fL (ref 78.0–100.0)
Platelets: 233 10*3/uL (ref 150–400)
RBC: 2.84 MIL/uL — AB (ref 3.87–5.11)
RDW: 14.5 % (ref 11.5–15.5)
WBC: 14.6 10*3/uL — ABNORMAL HIGH (ref 4.0–10.5)

## 2014-06-02 MED ORDER — MEDROXYPROGESTERONE ACETATE 150 MG/ML IM SUSP: 150.0000 mg | INTRAMUSCULAR | Status: DC | PRN

## 2014-06-02 MED ORDER — BISACODYL 10 MG RE SUPP: 10.0000 mg | Freq: Every day | RECTAL | Status: DC | PRN

## 2014-06-02 MED ORDER — MEASLES, MUMPS & RUBELLA VAC ~~LOC~~ INJ
0.5000 mL | INJECTION | Freq: Once | SUBCUTANEOUS | Status: DC
  Filled 2014-06-02: qty 0.5

## 2014-06-02 MED ORDER — ZOLPIDEM TARTRATE 5 MG PO TABS: 5.0000 mg | ORAL_TABLET | Freq: Every evening | ORAL | Status: DC | PRN

## 2014-06-02 MED ORDER — LANOLIN HYDROUS EX OINT: TOPICAL_OINTMENT | CUTANEOUS | Status: DC | PRN

## 2014-06-02 MED ORDER — WITCH HAZEL-GLYCERIN EX PADS: 1.0000 "application " | MEDICATED_PAD | CUTANEOUS | Status: DC | PRN

## 2014-06-02 MED ORDER — ONDANSETRON HCL 4 MG PO TABS: 4.0000 mg | ORAL_TABLET | ORAL | Status: DC | PRN

## 2014-06-02 MED ORDER — SENNOSIDES-DOCUSATE SODIUM 8.6-50 MG PO TABS
2.0000 | ORAL_TABLET | ORAL | Status: DC
  Administered 2014-06-02: 2 via ORAL
  Filled 2014-06-02: qty 2

## 2014-06-02 MED ORDER — SIMETHICONE 80 MG PO CHEW: 80.0000 mg | CHEWABLE_TABLET | ORAL | Status: DC | PRN

## 2014-06-02 MED ORDER — ONDANSETRON HCL 4 MG/2ML IJ SOLN: 4.0000 mg | INTRAMUSCULAR | Status: DC | PRN

## 2014-06-02 MED ORDER — DIBUCAINE 1 % RE OINT: 1.0000 "application " | TOPICAL_OINTMENT | RECTAL | Status: DC | PRN

## 2014-06-02 MED ORDER — PRENATAL MULTIVITAMIN CH
1.0000 | ORAL_TABLET | Freq: Every day | ORAL | Status: DC
  Administered 2014-06-02: 1 via ORAL
  Filled 2014-06-02: qty 1

## 2014-06-02 MED ORDER — TETANUS-DIPHTH-ACELL PERTUSSIS 5-2.5-18.5 LF-MCG/0.5 IM SUSP: 0.5000 mL | Freq: Once | INTRAMUSCULAR | Status: DC

## 2014-06-02 MED ORDER — DIPHENHYDRAMINE HCL 25 MG PO CAPS: 25.0000 mg | ORAL_CAPSULE | Freq: Four times a day (QID) | ORAL | Status: DC | PRN

## 2014-06-02 MED ORDER — FLEET ENEMA 7-19 GM/118ML RE ENEM: 1.0000 | ENEMA | Freq: Every day | RECTAL | Status: DC | PRN

## 2014-06-02 MED ORDER — BENZOCAINE-MENTHOL 20-0.5 % EX AERO: 1.0000 "application " | INHALATION_SPRAY | CUTANEOUS | Status: DC | PRN

## 2014-06-02 MED ORDER — OXYCODONE-ACETAMINOPHEN 5-325 MG PO TABS: 2.0000 | ORAL_TABLET | ORAL | Status: DC | PRN

## 2014-06-02 MED ORDER — OXYCODONE-ACETAMINOPHEN 5-325 MG PO TABS: 1.0000 | ORAL_TABLET | ORAL | Status: DC | PRN

## 2014-06-02 NOTE — Lactation Note (Signed)
This note was copied from the chart of [facility] [place]. Lactation Consultation Note  Patient Name: Carrie Thompson ATFTD'D Date: 06/02/2014 Reason for consult: Initial assessment;Breast/nipple pain;Difficult latch;Other (Comment) (mom with hx of previous BF problems) Mom attempted to breastfeed her 81 month old daughter but she had nipple pain and difficult latch so pumped for 6 weeks until daughter diagnosed with reflux and unable to tolerate ebm.  Mom becomes tearful during this [place] visit when baby crying and she says "I really want this to work this time."  [place] assisted with latching baby, first with NS as mom is turned toward (L) side but after her nurse comes in to assess her, mom sits up and is willing to try in football position.  Mom has boppy pillow and baby latches briefly with NS but lips are not wide enough for deep areolar grasp.  LC suggests trying without NS, since the (R) nipple everts into NS.  Hand expression shown to mom and nipple tends to flatten with breast compression. Milk is visible inside NS after brief latching and a few swallows, but there is more rhythmical sucking and more notable swallows without NS.  Mom reports less nipple pinching, as well.  Baby sustains latch and remains latched after 10 minutes with FOB there to assist.  Chin tug technique and breast compression shown to parents and assessment of feeding reported to RN, Juliann Pulse.  LC encouraged STS which calmed baby during latch.  LC also reviewed cue feedings and calming technique of "ssshing" if baby is fussy.  Baby has a prominent labial frenulum but suck exam on LC gloved finger normal with cupped tongue.  Mom says her daughter had this labial "tie" also.  Mom encouraged to feed baby 8-12 times/24 hours and with feeding cues. LC encouraged review of Baby and Me pp 9, 14 and 20-25 for STS and BF information. LC provided [employer] brochure and reviewed [facility] services and list of community and web site resources. RN who provided  nipple shield had also given mom the NS handout.  LC discussed need for mom to pump at least 4 times per day (per 24 hours) if NS used for all feedings.    Maternal Data Formula Feeding for Exclusion: No Has patient been taught Hand Expression?: Yes (LC demonstrated and small gtts visible) Does the patient have breastfeeding experience prior to this delivery?: Yes  Feeding Feeding Type: Breast Fed Length of feed: 10 min (remains latched after 10 minutes)  LATCH Score/Interventions Latch: Grasps breast easily, tongue down, lips flanged, rhythmical sucking.  Audible Swallowing: Spontaneous and intermittent  Type of Nipple: Everted at rest and after stimulation ((L) flat per mom; (R) tends to flatten with breast compression) Intervention(s): Hand pump  Comfort (Breast/Nipple): Filling, red/small blisters or bruises, mild/mod discomfort  Problem noted: Mild/Moderate discomfort Interventions (Mild/moderate discomfort): Comfort gels;Hand expression;Pre-pump if needed;Post-pump  Hold (Positioning): Assistance needed to correctly position infant at breast and maintain latch. Intervention(s): Breastfeeding basics reviewed;Support Pillows;Position options;Skin to skin  LATCH Score: 8  (LC assisted and observed - baby remains latched with rhythmical sucking after first 10 minutes; FOB to report total time to nurse)  Lactation Tools Discussed/Used Tools: Nipple Shields;Comfort gels;Pump (baby latched best without NS) Nipple shield size: 20 Breast pump type: Other (comment) (mom has Medela DEBP at home (used with her daughter)) NS handout STS, calming techniques, cue feedings, signs of proper latch Hand expression and nipple care with ebm and comfort gelpads  Consult Status Consult Status: Follow-up  Date: 06/03/14 Follow-up type: In-patient    Junious Dresser [facility] 06/02/2014, 8:31 PM

## 2014-06-02 NOTE — Progress Notes (Signed)
Patient was referred for history of depression/anxiety. * Referral screened out by Clinical Social Worker because none of the following criteria appear to apply:  ~ History of anxiety/depression during this pregnancy, or of post-partum depression.  ~ Diagnosis of anxiety and/or depression within last 3 years  ~ History of depression due to pregnancy loss/loss of child  OR * Patient's symptoms currently being treated with medication and/or therapy.  Please contact the Clinical Social Worker if needs arise, or by the patient's request. Pt reports depression/anxiety symptoms were situational in the past.

## 2014-06-02 NOTE — Progress Notes (Signed)
S:  Patient is doing well. No complaints.  O:  BP 148/84 mmHg  Pulse 94  Temp(Src) 98 F (36.7 C) (Oral)  Resp 17  Ht 5\' 7"  (1.702 m)  Wt 270 lb (122.471 kg)  BMI 42.28 kg/m2  SpO2 100%  Breastfeeding? Unknown Results for orders placed or performed during the hospital encounter of 05/31/14 (from the past 24 hour(s))  CBC     Status: Abnormal   Collection Time: 06/02/14  6:31 AM  Result Value Ref Range   WBC 14.6 (H) 4.0 - 10.5 K/uL   RBC 2.84 (L) 3.87 - 5.11 MIL/uL   Hemoglobin 7.8 (L) 12.0 - 15.0 g/dL   HCT 24.3 (L) 36.0 - 46.0 %   MCV 85.6 78.0 - 100.0 fL   MCH 27.5 26.0 - 34.0 pg   MCHC 32.1 30.0 - 36.0 g/dL   RDW 14.5 11.5 - 15.5 %   Platelets 233 150 - 400 K/uL   General alert and oriented Lung CTAB Car RRR Abdomen is soft and non tender  IMPRESSION: PPD #1 Doing well  PLAN: Routine care Discharge tomorrow

## 2014-06-03 ENCOUNTER — Ambulatory Visit: Payer: Self-pay

## 2014-06-03 NOTE — Discharge Summary (Signed)
Obstetric Discharge Summary Reason for Admission: induction of labor Prenatal Procedures: none Intrapartum Procedures: spontaneous vaginal delivery Postpartum Procedures: none Complications-Operative and Postpartum: first degree perineal laceration HEMOGLOBIN  Date Value Ref Range Status  06/02/2014 7.8* 12.0 - 15.0 g/dL Final   HCT  Date Value Ref Range Status  06/02/2014 24.3* 36.0 - 46.0 % Final    Physical Exam:  General: alert, cooperative and appears stated age 73: appropriate Uterine Fundus: firm Incision: healing well DVT Evaluation: No evidence of DVT seen on physical exam.  Discharge Diagnoses: Term Pregnancy-delivered  Discharge Information: Date: 06/03/2014 Activity: pelvic rest Diet: routine Medications: Ibuprofen Condition: improved Instructions: refer to practice specific booklet Discharge to: home   Newborn Data: Live born female  Birth Weight: 9 lb 7.2 oz (4286 g) APGAR: 9, 9  Home with mother.  Ednah Hammock L 06/03/2014, 8:49 AM

## 2014-06-03 NOTE — Lactation Note (Signed)
This note was copied from the chart of [place]. Lactation Consultation Note  Oral assessment: Labial frenum inserts at the upper alveolar ridge.  Snap back felt on gloved finger with suck evaluation.  Mom reports feeling chewing at times.  Jaw massage performed and baby pulled my finger deeper into his mouth.  Lip was manually  flanged and he was able to maintain seal.  Snapback resolved after massage and achieving depth.  Tongue function appropriate for now.  Assisted mom with latching baby to the right breast.  COmfort was reported after a few latching attempts.  Mom's left nipple is inverted and it is a more DL.  I suggested trying a position change at the next feeding.  She is using a NS on that side.  I gave her a #24 in the event that she has nipple changes. Post-pumping 6 times a day recommended to protect milk supply and to help evert the left nipple.  She plans to talk to her ped about revising the labial frenum.  Aware of support group and outpatient services.  She will call for assist prn. Patient Name: Carrie Thompson [id number] Date: 06/03/2014 Reason for consult: Follow-up assessment;Difficult latch   Maternal Data Has patient been taught Hand Expression?: Yes  Feeding Feeding Type: Breast Fed Length of feed: 10 min  LATCH Score/Interventions Latch: Repeated attempts needed to sustain latch, nipple held in mouth throughout feeding, stimulation needed to elicit sucking reflex.  Audible Swallowing: Spontaneous and intermittent  Type of Nipple: Everted at rest and after stimulation  Comfort (Breast/Nipple): Filling, red/small blisters or bruises, mild/mod discomfort  Problem noted: Filling;Cracked, bleeding, blisters, bruises Interventions  (Cracked/bleeding/bruising/blister): Expressed breast milk to nipple;Hand pump Interventions (Mild/moderate discomfort): Post-pump  Hold (Positioning): Assistance needed to correctly position infant at breast and maintain  latch.  LATCH Score: 7  Lactation Tools Discussed/Used Nipple shield size:  (does not use NS on right breast)   Consult Status Consult Status: PRN Follow-up type: Call as needed    Van Clines 06/03/2014, 12:02 PM

## 2014-06-04 ENCOUNTER — Inpatient Hospital Stay (HOSPITAL_COMMUNITY): Payer: 59

## 2014-06-18 ENCOUNTER — Ambulatory Visit: Payer: BC Managed Care – PPO | Admitting: Internal Medicine

## 2014-06-26 ENCOUNTER — Ambulatory Visit: Payer: BC Managed Care – PPO | Admitting: Internal Medicine

## 2014-09-27 ENCOUNTER — Ambulatory Visit: Payer: 59 | Admitting: Internal Medicine

## 2014-10-10 ENCOUNTER — Ambulatory Visit (INDEPENDENT_AMBULATORY_CARE_PROVIDER_SITE_OTHER): Payer: 59 | Admitting: Internal Medicine

## 2014-10-10 ENCOUNTER — Encounter: Payer: Self-pay | Admitting: Internal Medicine

## 2014-10-10 VITALS — BP 118/72 | HR 76 | Temp 98.3°F | Ht 67.0 in | Wt 235.0 lb

## 2014-10-10 DIAGNOSIS — F418 Other specified anxiety disorders: Secondary | ICD-10-CM | POA: Diagnosis not present

## 2014-10-10 DIAGNOSIS — J4520 Mild intermittent asthma, uncomplicated: Secondary | ICD-10-CM

## 2014-10-10 DIAGNOSIS — F329 Major depressive disorder, single episode, unspecified: Secondary | ICD-10-CM

## 2014-10-10 DIAGNOSIS — F419 Anxiety disorder, unspecified: Secondary | ICD-10-CM

## 2014-10-10 DIAGNOSIS — F32A Depression, unspecified: Secondary | ICD-10-CM

## 2014-10-10 DIAGNOSIS — G932 Benign intracranial hypertension: Secondary | ICD-10-CM | POA: Diagnosis not present

## 2014-10-10 DIAGNOSIS — J45909 Unspecified asthma, uncomplicated: Secondary | ICD-10-CM | POA: Insufficient documentation

## 2014-10-10 NOTE — Assessment & Plan Note (Signed)
She reports this has resolved. 

## 2014-10-10 NOTE — Progress Notes (Signed)
HPI  Pt presents to the clinic today to establish care and for management of the conditions listed below. She has not had a PCP in many years but has been seeing a OB/GYN.  Flu: never Tetanus: 2014 Pap Smear: 07/2013- normal Dentist: biannually  Asthma: She reports she had this as a child but "grew out of it". She thinks it was allergy induced. She has not had any issues as an adult.   Anxiety and Depression: Triggered by everyday stress. She is not on medications currently but was on Prozac in the past.She denies SI/HI.  Psuedotumor: Resolved with medications and weight loss. She never actually had seizures. She denies any reoccurrence.  Past Medical History  Diagnosis Date  . Abnormal Pap smear   . Hx of varicella   . Asthma   . Depression   . Anxiety   . Vaginal Pap smear, abnormal   . 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 Prenatal Vit-Fe Fumarate-FA (PRENATAL MULTIVITAMIN) TABS Take 1 tablet by mouth daily.      No current facility-administered medications for this visit.    Allergies  Allergen Reactions  . Shellfish Allergy Anaphylaxis  . Iodine Other (See Comments)    Pt states that she avoids iodine because of her reaction to shellfish.      Family History  Problem Relation Age of Onset  . Other Mother     varicose veins  . Varicose Veins Mother   . Diabetes Father   . Rheum arthritis Maternal Grandmother   . Hypertension Maternal Grandfather     History   Social History  . Marital Status: Married    Spouse Name: N/A  . Number of Children: N/A  . Years of Education: N/A   Occupational History  . Not on file.   Social History Main Topics  . Smoking status: Never Smoker   . Smokeless tobacco: Never Used  . Alcohol Use: No  . Drug Use: No  . Sexual Activity: Yes   Other Topics Concern  . Not on file   Social History Narrative     ROS:  Constitutional: Denies fever, malaise, fatigue, headache or abrupt weight changes.  HEENT: Denies eye pain, eye redness, ear pain, ringing in the ears, wax buildup, runny nose, nasal congestion, bloody nose, or sore throat. Respiratory: Denies difficulty breathing, shortness of breath, cough or sputum production.   Cardiovascular: Denies chest pain, chest tightness, palpitations or swelling in the hands or feet.  Skin: Denies redness, rashes, lesions or ulcercations.  Neurological: Denies dizziness, difficulty with memory, difficulty with speech or problems with balance and coordination.  Psych: Pt reports history of anxiety and depression. Denies SI/HI.  No other specific complaints in a complete review of systems (except as listed in HPI above).  PE:  BP 118/72 mmHg  Pulse 76  Temp(Src) 98.3 F (36.8 C) (Oral)  Ht 5\' 7"  (1.702 m)  Wt 235 lb (106.595 kg)  BMI 36.80 kg/m2  SpO2 99%  LMP 09/10/2014  Breastfeeding? Yes  Wt Readings from Last 3 Encounters:  10/10/14 235 lb (106.595 kg)  05/31/14 270 lb (122.471 kg)  05/19/14 268 lb 4 oz (121.677 kg)    General: Appears her stated age, obese in NAD. HEENT: Head: normal shape and size; Eyes: sclera white, no icterus, conjunctiva pink, PERRLA and EOMs intact;  Cardiovascular: Normal rate and rhythm. S1,S2 noted.  No murmur, rubs or gallops noted.  Pulmonary/Chest: Normal effort and positive vesicular breath sounds. No respiratory distress. No wheezes, rales or ronchi noted.  Neurological: Alert and oriented.  Psychiatric: Mood and affect normal. Behavior is normal. Judgment and thought content normal.     BMET    Component Value Date/Time   NA 139 05/31/2014 2050   K 4.1 05/31/2014 2050   CL 105 05/31/2014 2050   CO2 22 05/31/2014 2050   GLUCOSE 105* 05/31/2014 2050   BUN 12 05/31/2014 2050   CREATININE 0.61 05/31/2014 2050   CALCIUM 8.4 05/31/2014 2050   GFRNONAA >90 05/31/2014 2050   GFRAA >90 05/31/2014  2050    Lipid Panel  No results found for: CHOL, TRIG, HDL, CHOLHDL, VLDL, LDLCALC  CBC    Component Value Date/Time   WBC 14.6* 06/02/2014 0631   RBC 2.84* 06/02/2014 0631   HGB 7.8* 06/02/2014 0631   HCT 24.3* 06/02/2014 0631   PLT 233 06/02/2014 0631   MCV 85.6 06/02/2014 0631   MCH 27.5 06/02/2014 0631   MCHC 32.1 06/02/2014 0631   RDW 14.5 06/02/2014 0631    Hgb A1C No results found for: HGBA1C   Assessment and Plan:  RTC when convenient and we will do your annual exam

## 2014-10-10 NOTE — Assessment & Plan Note (Signed)
Chronic but stable off meds Will continue to monitor

## 2014-10-10 NOTE — Patient Instructions (Signed)
Generalized Anxiety Disorder Generalized anxiety disorder (GAD) is a mental disorder. It interferes with life functions, including relationships, work, and school. GAD is different from normal anxiety, which everyone experiences at some point in their lives in response to specific life events and activities. Normal anxiety actually helps us prepare for and get through these life events and activities. Normal anxiety goes away after the event or activity is over.  GAD causes anxiety that is not necessarily related to specific events or activities. It also causes excess anxiety in proportion to specific events or activities. The anxiety associated with GAD is also difficult to control. GAD can vary from mild to severe. People with severe GAD can have intense waves of anxiety with physical symptoms (panic attacks).  SYMPTOMS The anxiety and worry associated with GAD are difficult to control. This anxiety and worry are related to many life events and activities and also occur more days than not for 6 months or longer. People with GAD also have three or more of the following symptoms (one or more in children):  Restlessness.   Fatigue.  Difficulty concentrating.   Irritability.  Muscle tension.  Difficulty sleeping or unsatisfying sleep. DIAGNOSIS GAD is diagnosed through an assessment by your health care provider. Your health care provider will ask you questions aboutyour mood,physical symptoms, and events in your life. Your health care provider may ask you about your medical history and use of alcohol or drugs, including prescription medicines. Your health care provider may also do a physical exam and blood tests. Certain medical conditions and the use of certain substances can cause symptoms similar to those associated with GAD. Your health care provider may refer you to a mental health specialist for further evaluation. TREATMENT The following therapies are usually used to treat GAD:    Medication. Antidepressant medication usually is prescribed for long-term daily control. Antianxiety medicines may be added in severe cases, especially when panic attacks occur.   Talk therapy (psychotherapy). Certain types of talk therapy can be helpful in treating GAD by providing support, education, and guidance. A form of talk therapy called cognitive behavioral therapy can teach you healthy ways to think about and react to daily life events and activities.  Stress managementtechniques. These include yoga, meditation, and exercise and can be very helpful when they are practiced regularly. A mental health specialist can help determine which treatment is best for you. Some people see improvement with one therapy. However, other people require a combination of therapies. Document Released: 07/04/2012 Document Revised: 07/24/2013 Document Reviewed: 07/04/2012 ExitCare Patient Information 2015 [employer]. This information is not intended to replace advice given to you by your health care provider. Make sure you discuss any questions you have with your health care provider.  

## 2014-10-10 NOTE — Assessment & Plan Note (Signed)
No current issues Will continue to monitor at this time

## 2014-10-10 NOTE — Progress Notes (Signed)
Pre visit review using our clinic review tool, if applicable. No additional management support is needed unless otherwise documented below in the visit note. 

## 2015-02-06 ENCOUNTER — Encounter: Payer: Self-pay | Admitting: Internal Medicine

## 2015-02-06 ENCOUNTER — Ambulatory Visit (INDEPENDENT_AMBULATORY_CARE_PROVIDER_SITE_OTHER): Payer: 59 | Admitting: Internal Medicine

## 2015-02-06 VITALS — BP 112/84 | HR 88 | Temp 98.0°F | Wt 239.0 lb

## 2015-02-06 DIAGNOSIS — J029 Acute pharyngitis, unspecified: Secondary | ICD-10-CM

## 2015-02-06 DIAGNOSIS — R52 Pain, unspecified: Secondary | ICD-10-CM | POA: Diagnosis not present

## 2015-02-06 LAB — COMPREHENSIVE METABOLIC PANEL
ALBUMIN: 4.1 g/dL (ref 3.5–5.2)
ALT: 18 U/L (ref 0–35)
AST: 20 U/L (ref 0–37)
Alkaline Phosphatase: 102 U/L (ref 39–117)
BUN: 12 mg/dL (ref 6–23)
CO2: 31 mEq/L (ref 19–32)
CREATININE: 0.75 mg/dL (ref 0.40–1.20)
Calcium: 8.7 mg/dL (ref 8.4–10.5)
Chloride: 102 mEq/L (ref 96–112)
GFR: 100.15 mL/min (ref >60.00)
GLUCOSE: 88 mg/dL (ref 70–99)
Potassium: 3.9 mEq/L (ref 3.5–5.1)
SODIUM: 139 meq/L (ref 135–145)
TOTAL PROTEIN: 7 g/dL (ref 6.0–8.3)
Total Bilirubin: 0.5 mg/dL (ref 0.2–1.2)

## 2015-02-06 LAB — CBC
HCT: 39.9 % (ref 36.0–46.0)
Hemoglobin: 13.2 g/dL (ref 12.0–15.0)
MCHC: 33.2 g/dL (ref 30.0–36.0)
MCV: 90.7 fl (ref 78.0–100.0)
Platelets: 217 10*3/uL (ref 150.0–400.0)
RBC: 4.4 Mil/uL (ref 3.87–5.11)
RDW: 12.3 % (ref 11.5–15.5)
WBC: 4.3 10*3/uL (ref 4.0–10.5)

## 2015-02-06 LAB — MONONUCLEOSIS SCREEN: MONO SCREEN: NEGATIVE

## 2015-02-06 NOTE — Progress Notes (Signed)
Pre visit review using our clinic review tool, if applicable. No additional management support is needed unless otherwise documented below in the visit note. 

## 2015-02-06 NOTE — Patient Instructions (Signed)

## 2015-02-06 NOTE — Progress Notes (Signed)
Subjective:    Patient ID: Carrie Thompson, female    DOB: 1990-07-29, 24 y.o.   MRN: [id number]  HPI  Pt presents to the clinic today with c/o sore throat and body aches. This started 2-3 days ago. The pain has been intermittent. It seems worse first thing in the morning. She denies difficulty swallowing. She denies runny nose, nasal congestion, ear pain or cough. She denies fever, chills or body aches.She has not tried anything OTC. She does have a history of asthma but denies allergies. She has not had sick contacts that she is aware of.  Review of Systems      Past Medical History  Diagnosis Date  . Abnormal Pap smear   . Hx of varicella   . Asthma   . Depression   . Anxiety   . Vaginal Pap smear, abnormal   . 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 Chicken pox     Current Outpatient Prescriptions  Medication Sig Dispense Refill  . Prenatal Vit-Fe Fumarate-FA (PRENATAL MULTIVITAMIN) TABS Take 1 tablet by mouth daily.      No current facility-administered medications for this visit.    Allergies  Allergen Reactions  . Shellfish Allergy Anaphylaxis  . Iodine Other (See Comments)    Pt states that she avoids iodine because of her reaction to shellfish.      Family History  Problem Relation Age of Onset  . Other Mother     varicose veins  . Varicose Veins Mother   . Mental illness Mother   . Diabetes Father   . Mental illness Father   . Rheum arthritis Maternal Grandmother   . Hypertension Maternal Grandfather     Social History   Social History  . Marital Status: Married    Spouse Name: N/A  . Number of Children: N/A  . Years of Education: N/A   Occupational History  . Not on file.   Social History Main Topics  . Smoking status: Never Smoker   . Smokeless tobacco: Never Used  . Alcohol Use: No  . Drug Use: No  . Sexual Activity: Yes    Birth Control/ Protection: IUD   Other Topics Concern  . Not on file    Social History Narrative     Constitutional: Denies fever, malaise, fatigue, headache or abrupt weight changes.  HEENT: Pt reports sore throat. Denies eye pain, eye redness, ear pain, ringing in the ears, wax buildup, runny nose, nasal congestion, bloody nose. Respiratory: Denies difficulty breathing, shortness of breath, cough or sputum production.   Cardiovascular: Denies chest pain, chest tightness, palpitations or swelling in the hands or feet.  Musculoskeletal: Pt reports body aches. Denies decrease in range of motion, difficulty with gait, or joint pain and swelling.    No other specific complaints in a complete review of systems (except as listed in HPI above).  Objective:   Physical Exam  BP 112/84 mmHg  Pulse 88  Temp(Src) 98 F (36.7 C) (Oral)  Wt 239 lb (108.41 kg)  SpO2 98% Wt Readings from Last 3 Encounters:  02/06/15 239 lb (108.41 kg)  10/10/14 235 lb (106.595 kg)  05/31/14 270 lb (122.471 kg)    General: Appears her stated age, well developed, well nourished in NAD. Skin: Warm, dry and intact. No rashes, lesions or ulcerations noted. HEENT: Head: normal shape and size; Eyes: sclera white, no icterus, conjunctiva pink; Ears: Tm's gray and intact,  normal light reflex; Throat/Mouth: Teeth present, mucosa slightly erythematous and moist, no exudate, lesions or ulcerations noted.  Neck:  Anterior cervical adenopathy noted on the right. Cardiovascular: Normal rate and rhythm. S1,S2 noted.  No murmur, rubs or gallops noted.  Pulmonary/Chest: Normal effort and positive vesicular breath sounds. No respiratory distress. No wheezes, rales or ronchi noted.  Musculoskeletal: Strength 5/5 BUE/BLE. Neurological: Alert and oriented.    BMET    Component Value Date/Time   NA 139 05/31/2014 2050   K 4.1 05/31/2014 2050   CL 105 05/31/2014 2050   CO2 22 05/31/2014 2050   GLUCOSE 105* 05/31/2014 2050   BUN 12 05/31/2014 2050   CREATININE 0.61 05/31/2014 2050   CALCIUM  8.4 05/31/2014 2050   GFRNONAA >90 05/31/2014 2050   GFRAA >90 05/31/2014 2050    Lipid Panel  No results found for: CHOL, TRIG, HDL, CHOLHDL, VLDL, LDLCALC  CBC    Component Value Date/Time   WBC 14.6* 06/02/2014 0631   RBC 2.84* 06/02/2014 0631   HGB 7.8* 06/02/2014 0631   HCT 24.3* 06/02/2014 0631   PLT 233 06/02/2014 0631   MCV 85.6 06/02/2014 0631   MCH 27.5 06/02/2014 0631   MCHC 32.1 06/02/2014 0631   RDW 14.5 06/02/2014 0631    Hgb A1C No results found for: HGBA1C       Assessment & Plan:   Sore throat and body aches:  Does not appear to be strep or flu No apparent PND or s/s of allergies Take Ibuprofen 400 mg Q8H with meals until you hear back from me CBC, CMET and Mono Spot today Return precautions given  RTC as needed or if symptoms persist or worsen

## 2015-10-29 ENCOUNTER — Telehealth: Payer: Self-pay

## 2015-10-29 NOTE — Telephone Encounter (Signed)
Left detailed msg on VM per HIPAA  

## 2015-10-29 NOTE — Telephone Encounter (Signed)
Patient said she'll come by and sign records release tomorrow morning.

## 2015-10-29 NOTE — Telephone Encounter (Signed)
Pt left v/m; pt has previously been on med for anxiety and ADHD. Pt stopped med while having children. Pt does not plan to have more children and wants to know if Avie Echevaria NP would prescribe med for ADHD before making appt. Pt request cb.

## 2015-10-29 NOTE — Telephone Encounter (Signed)
If she can provide me with the copy of the report from a psycologist/psychiatrist where she was formally evaluated and diagnosed with ADD, then I will prescribe her meds.

## 2015-10-29 NOTE — Telephone Encounter (Signed)
Please put form in my inbox---also pt should not put down what is requested --Webb Silversmith will fill that in

## 2016-01-06 ENCOUNTER — Encounter: Payer: Self-pay | Admitting: Internal Medicine

## 2016-01-06 ENCOUNTER — Ambulatory Visit (INDEPENDENT_AMBULATORY_CARE_PROVIDER_SITE_OTHER): Payer: BLUE CROSS/BLUE SHIELD | Admitting: Internal Medicine

## 2016-01-06 DIAGNOSIS — F418 Other specified anxiety disorders: Secondary | ICD-10-CM

## 2016-01-06 DIAGNOSIS — F419 Anxiety disorder, unspecified: Principal | ICD-10-CM

## 2016-01-06 DIAGNOSIS — F32A Depression, unspecified: Secondary | ICD-10-CM

## 2016-01-06 DIAGNOSIS — F988 Other specified behavioral and emotional disorders with onset usually occurring in childhood and adolescence: Secondary | ICD-10-CM

## 2016-01-06 DIAGNOSIS — F329 Major depressive disorder, single episode, unspecified: Secondary | ICD-10-CM

## 2016-01-06 MED ORDER — ESCITALOPRAM OXALATE 10 MG PO TABS: 10.0000 mg | ORAL_TABLET | Freq: Every day | ORAL | 5 refills | Status: DC

## 2016-01-06 NOTE — Assessment & Plan Note (Signed)
Deteriorated Awaiting records for [facility] If + diagnosis by psychologist with ADD, will restart Adderall

## 2016-01-06 NOTE — Progress Notes (Signed)
Subjective:    Patient ID: Carrie Thompson, female    DOB: 03/30/1990, 25 y.o.   MRN: [id number]  HPI  Pt presents to the clinic today to discuss ADD, anxiety and depression. She reports she was diagnosed with ADD at age 64 at [facility]. She had issues during college, feeling of overwhelmed, lack of organization and difficulty with motivation. She was started on Adderall at that time. They also felt like she had mild anxiety and depression, so she was started on Lexapro as well, which she reports worked well for her. She came off the medication at age 56 due to pregnancy. 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 She often forgets to do things she is supposed to daily, and when she does do it, it takes her longer than normal. She feels overwhelmed, easily angered and irritable. She feels like she felt when she was 12 ,just before she was diagnosed and on meds. She would like to restart meds now.  Review of Systems      Past Medical History:  Diagnosis Date  . Abnormal Pap smear   . Anxiety   . Asthma   . Chicken pox   . Depression   . Hx of varicella   . 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 Vaginal Pap smear, abnormal     Current Outpatient Prescriptions  Medication Sig Dispense Refill  . escitalopram (LEXAPRO) 10 MG tablet Take 1 tablet (10 mg total) by mouth at bedtime. 30 tablet 5   No current facility-administered medications for this visit.     Allergies  Allergen Reactions  . Shellfish Allergy Anaphylaxis  . Iodine Other (See Comments)    Pt states that she avoids iodine because of her reaction to shellfish.      Family History  Problem Relation Age of Onset  . Other Mother     varicose veins  . Varicose Veins Mother   . Mental illness Mother   . Diabetes Father   . Mental illness Father   . Rheum arthritis Maternal Grandmother   . Hypertension Maternal Grandfather     Social  History   Social History  . Marital status: Married    Spouse name: N/A  . Number of children: N/A  . Years of education: N/A   Occupational History  . Not on file.   Social History Main Topics  . Smoking status: Never Smoker  . Smokeless tobacco: Never Used  . Alcohol use No  . Drug use: No  . Sexual activity: Yes    Birth control/ protection: IUD   Other Topics Concern  . Not on file   Social History Narrative  . No narrative on file     Constitutional: Denies fever, malaise, fatigue, headache or abrupt weight changes.  Neurological: Pt reports lack of motivation, irritability, trouble focusing. Denies dizziness, difficulty with speech or problems with balance and coordination.  Psych: Pt reports anxiety and depression. Denies SI/HI.  No other specific complaints in a complete review of systems (except as listed in HPI above).  Objective:   Physical Exam   BP 116/80   Pulse 77   Temp 98.5 F (36.9 C) (Oral)   Wt 268 lb 8 oz (121.8 kg)   SpO2 98%   BMI 42.05 kg/m  Wt Readings from Last 3 Encounters:  01/06/16 268 lb 8 oz (121.8 kg)  02/06/15 239 lb (108.4 kg)  10/10/14 235 lb (106.6 kg)    General: Appears her stated age, obese in NAD. Neurological: Alert and oriented. Psychiatric: She is mildly anxious appearing.    BMET    Component Value Date/Time   NA 139 02/06/2015 1343   K 3.9 02/06/2015 1343   CL 102 02/06/2015 1343   CO2 31 02/06/2015 1343   GLUCOSE 88 02/06/2015 1343   BUN 12 02/06/2015 1343   CREATININE 0.75 02/06/2015 1343   CALCIUM 8.7 02/06/2015 1343   GFRNONAA >90 05/31/2014 2050   GFRAA >90 05/31/2014 2050    Lipid Panel  No results found for: CHOL, TRIG, HDL, CHOLHDL, VLDL, LDLCALC  CBC    Component Value Date/Time   WBC 4.3 02/06/2015 1343   RBC 4.40 02/06/2015 1343   HGB 13.2 02/06/2015 1343   HCT 39.9 02/06/2015 1343   PLT 217.0 02/06/2015 1343   MCV 90.7 02/06/2015 1343   MCH 27.5 06/02/2014 0631   MCHC 33.2  02/06/2015 1343   RDW 12.3 02/06/2015 1343    Hgb A1C No results found for: HGBA1C      Assessment & Plan:

## 2016-01-06 NOTE — Patient Instructions (Signed)

## 2016-01-06 NOTE — Assessment & Plan Note (Signed)
Deteriorated Will restart Lexapro (had anger issues on Prozac)  Update me in 4 weeks and let me know how you are doing

## 2016-03-12 ENCOUNTER — Encounter: Payer: Self-pay | Admitting: Internal Medicine

## 2016-03-13 ENCOUNTER — Telehealth: Payer: Self-pay

## 2016-03-13 MED ORDER — EPINEPHRINE 0.15 MG/0.15ML IJ SOAJ: 0.1500 mg | INTRAMUSCULAR | 0 refills | Status: DC | PRN

## 2016-03-13 NOTE — Telephone Encounter (Signed)
Anna at [facility] left v/m requesting clarification of epipen sent to [facility]; do you want pt to have the epipen junior or do you want pt to have epipen 0.3 mg adult size.Please advise.

## 2016-03-13 NOTE — Telephone Encounter (Signed)
Adult size

## 2016-03-14 MED ORDER — EPINEPHRINE 0.3 MG/0.3ML IJ SOAJ: 0.3000 mg | Freq: Once | INTRAMUSCULAR | 1 refills | Status: AC

## 2016-03-14 NOTE — Telephone Encounter (Signed)
Verbal order given to pharmacy.

## 2016-03-14 NOTE — Addendum Note (Signed)
Addended by: Lurlean Nanny on: 03/14/2016 12:55 PM   Modules accepted: Orders

## 2016-06-03 ENCOUNTER — Encounter: Payer: Self-pay | Admitting: Internal Medicine

## 2016-06-03 ENCOUNTER — Ambulatory Visit (INDEPENDENT_AMBULATORY_CARE_PROVIDER_SITE_OTHER): Payer: BLUE CROSS/BLUE SHIELD | Admitting: Internal Medicine

## 2016-06-03 VITALS — BP 116/78 | HR 79 | Temp 97.9°F | Ht 67.5 in | Wt 255.0 lb

## 2016-06-03 DIAGNOSIS — F329 Major depressive disorder, single episode, unspecified: Secondary | ICD-10-CM

## 2016-06-03 DIAGNOSIS — F419 Anxiety disorder, unspecified: Secondary | ICD-10-CM

## 2016-06-03 DIAGNOSIS — D2371 Other benign neoplasm of skin of right lower limb, including hip: Secondary | ICD-10-CM | POA: Diagnosis not present

## 2016-06-03 DIAGNOSIS — F418 Other specified anxiety disorders: Secondary | ICD-10-CM | POA: Diagnosis not present

## 2016-06-03 DIAGNOSIS — Z0001 Encounter for general adult medical examination with abnormal findings: Secondary | ICD-10-CM | POA: Diagnosis not present

## 2016-06-03 LAB — COMPREHENSIVE METABOLIC PANEL
ALT: 86 U/L — AB (ref 0–35)
AST: 39 U/L — AB (ref 0–37)
Albumin: 4.7 g/dL (ref 3.5–5.2)
Alkaline Phosphatase: 73 U/L (ref 39–117)
BUN: 14 mg/dL (ref 6–23)
CALCIUM: 9.3 mg/dL (ref 8.4–10.5)
CHLORIDE: 108 meq/L (ref 96–112)
CO2: 23 mEq/L (ref 19–32)
Creatinine, Ser: 0.81 mg/dL (ref 0.40–1.20)
GFR: 90.68 mL/min (ref >60.00)
GLUCOSE: 96 mg/dL (ref 70–99)
Potassium: 4.6 mEq/L (ref 3.5–5.1)
Sodium: 136 mEq/L (ref 135–145)
Total Bilirubin: 0.5 mg/dL (ref 0.2–1.2)
Total Protein: 7.9 g/dL (ref 6.0–8.3)

## 2016-06-03 LAB — CBC
HCT: 47 % — ABNORMAL HIGH (ref 36.0–46.0)
Hemoglobin: 15.8 g/dL — ABNORMAL HIGH (ref 12.0–15.0)
MCHC: 33.7 g/dL (ref 30.0–36.0)
MCV: 92.9 fl (ref 78.0–100.0)
PLATELETS: 275 10*3/uL (ref 150.0–400.0)
RBC: 5.06 Mil/uL (ref 3.87–5.11)
RDW: 13.2 % (ref 11.5–15.5)
WBC: 7 10*3/uL (ref 4.0–10.5)

## 2016-06-03 LAB — LIPID PANEL
Cholesterol: 119 mg/dL (ref 0–200)
HDL: 30.3 mg/dL — ABNORMAL LOW (ref >39.00)
LDL CALC: 77 mg/dL (ref 0–99)
NONHDL: 88.97
Total CHOL/HDL Ratio: 4
Triglycerides: 58 mg/dL (ref 0.0–149.0)
VLDL: 11.6 mg/dL (ref 0.0–40.0)

## 2016-06-03 LAB — HEMOGLOBIN A1C: Hgb A1c MFr Bld: 5.5 % (ref 4.6–6.5)

## 2016-06-03 NOTE — Patient Instructions (Signed)
Health Maintenance, Female Adopting a healthy lifestyle and getting preventive care can go a long way to promote health and wellness. Talk with your health care provider about what schedule of regular examinations is right for you. This is a good chance for you to check in with your provider about disease prevention and staying healthy. In between checkups, there are plenty of things you can do on your own. Experts have done a lot of research about which lifestyle changes and preventive measures are most likely to keep you healthy. Ask your health care provider for more information. Weight and diet Eat a healthy diet  Be sure to include plenty of vegetables, fruits, low-fat dairy products, and lean protein.  Do not eat a lot of foods high in solid fats, added sugars, or salt.  Get regular exercise. This is one of the most important things you can do for your health.  Most adults should exercise for at least 150 minutes each week. The exercise should increase your heart rate and make you sweat (moderate-intensity exercise).  Most adults should also do strengthening exercises at least twice a week. This is in addition to the moderate-intensity exercise. Maintain a healthy weight  Body mass index (BMI) is a measurement that can be used to identify possible weight problems. It estimates body fat based on height and weight. Your health care provider can help determine your BMI and help you achieve or maintain a healthy weight.  For females 76 years of age and older:  A BMI below 18.5 is considered underweight.  A BMI of 18.5 to 24.9 is normal.  A BMI of 25 to 29.9 is considered overweight.  A BMI of 30 and above is considered obese. Watch levels of cholesterol and blood lipids  You should start having your blood tested for lipids and cholesterol at 27 years of age, then have this test every 5 years.  You may need to have your cholesterol levels checked more often if:  Your lipid or  cholesterol levels are high.  You are older than 27 years of age.  You are at high risk for heart disease. Cancer screening Lung Cancer  Lung cancer screening is recommended for adults 64-42 years old who are at high risk for lung cancer because of a history of smoking.  A yearly low-dose CT scan of the lungs is recommended for people who:  Currently smoke.  Have quit within the past 15 years.  Have at least a 30-pack-year history of smoking. A pack year is smoking an average of one pack of cigarettes a day for 1 year.  Yearly screening should continue until it has been 15 years since you quit.  Yearly screening should stop if you develop a health problem that would prevent you from having lung cancer treatment. Breast Cancer  Practice breast self-awareness. This means understanding how your breasts normally appear and feel.  It also means doing regular breast self-exams. Let your health care provider know about any changes, no matter how small.  If you are in your 20s or 30s, you should have a clinical breast exam (CBE) by a health care provider every 1-3 years as part of a regular health exam.  If you are 34 or older, have a CBE every year. Also consider having a breast X-ray (mammogram) every year.  If you have a family history of breast cancer, talk to your health care provider about genetic screening.  If you are at high risk for breast cancer, talk  to your health care provider about having an MRI and a mammogram every year.  Breast cancer gene (BRCA) assessment is recommended for women who have family members with BRCA-related cancers. BRCA-related cancers include:  Breast.  Ovarian.  Tubal.  Peritoneal cancers.  Results of the assessment will determine the need for genetic counseling and BRCA1 and BRCA2 testing. Cervical Cancer  Your health care provider may recommend that you be screened regularly for cancer of the pelvic organs (ovaries, uterus, and vagina).  This screening involves a pelvic examination, including checking for microscopic changes to the surface of your cervix (Pap test). You may be encouraged to have this screening done every 3 years, beginning at age 24.  For women ages 66-65, health care providers may recommend pelvic exams and Pap testing every 3 years, or they may recommend the Pap and pelvic exam, combined with testing for human papilloma virus (HPV), every 5 years. Some types of HPV increase your risk of cervical cancer. Testing for HPV may also be done on women of any age with unclear Pap test results.  Other health care providers may not recommend any screening for nonpregnant women who are considered low risk for pelvic cancer and who do not have symptoms. Ask your health care provider if a screening pelvic exam is right for you.  If you have had past treatment for cervical cancer or a condition that could lead to cancer, you need Pap tests and screening for cancer for at least 20 years after your treatment. If Pap tests have been discontinued, your risk factors (such as having a new sexual partner) need to be reassessed to determine if screening should resume. Some women have medical problems that increase the chance of getting cervical cancer. In these cases, your health care provider may recommend more frequent screening and Pap tests. Colorectal Cancer  This type of cancer can be detected and often prevented.  Routine colorectal cancer screening usually begins at 27 years of age and continues through 27 years of age.  Your health care provider may recommend screening at an earlier age if you have risk factors for colon cancer.  Your health care provider may also recommend using home test kits to check for hidden blood in the stool.  A small camera at the end of a tube can be used to examine your colon directly (sigmoidoscopy or colonoscopy). This is done to check for the earliest forms of colorectal cancer.  Routine  screening usually begins at age 41.  Direct examination of the colon should be repeated every 5-10 years through 27 years of age. However, you may need to be screened more often if early forms of precancerous polyps or small growths are found. Skin Cancer  Check your skin from head to toe regularly.  Tell your health care provider about any new moles or changes in moles, especially if there is a change in a mole's shape or color.  Also tell your health care provider if you have a mole that is larger than the size of a pencil eraser.  Always use sunscreen. Apply sunscreen liberally and repeatedly throughout the day.  Protect yourself by wearing long sleeves, pants, a wide-brimmed hat, and sunglasses whenever you are outside. Heart disease, diabetes, and high blood pressure  High blood pressure causes heart disease and increases the risk of stroke. High blood pressure is more likely to develop in:  People who have blood pressure in the high end of the normal range (130-139/85-89 mm Hg).  People who are overweight or obese.  People who are African American.  If you are 59-24 years of age, have your blood pressure checked every 3-5 years. If you are 34 years of age or older, have your blood pressure checked every year. You should have your blood pressure measured twice-once when you are at a hospital or clinic, and once when you are not at a hospital or clinic. Record the average of the two measurements. To check your blood pressure when you are not at a hospital or clinic, you can use:  An automated blood pressure machine at a pharmacy.  A home blood pressure monitor.  If you are between 29 years and 60 years old, ask your health care provider if you should take aspirin to prevent strokes.  Have regular diabetes screenings. This involves taking a blood sample to check your fasting blood sugar level.  If you are at a normal weight and have a low risk for diabetes, have this test once  every three years after 27 years of age.  If you are overweight and have a high risk for diabetes, consider being tested at a younger age or more often. Preventing infection Hepatitis B  If you have a higher risk for hepatitis B, you should be screened for this virus. You are considered at high risk for hepatitis B if:  You were born in a country where hepatitis B is common. Ask your health care provider which countries are considered high risk.  Your parents were born in a high-risk country, and you have not been immunized against hepatitis B (hepatitis B vaccine).  You have HIV or AIDS.  You use needles to inject street drugs.  You live with someone who has hepatitis B.  You have had sex with someone who has hepatitis B.  You get hemodialysis treatment.  You take certain medicines for conditions, including cancer, organ transplantation, and autoimmune conditions. Hepatitis C  Blood testing is recommended for:  Everyone born from [birth year] through 1966.  Anyone with known risk factors for hepatitis C. Sexually transmitted infections (STIs)  You should be screened for sexually transmitted infections (STIs) including gonorrhea and chlamydia if:  You are sexually active and are younger than 27 years of age.  You are older than 27 years of age and your health care provider tells you that you are at risk for this type of infection.  Your sexual activity has changed since you were last screened and you are at an increased risk for chlamydia or gonorrhea. Ask your health care provider if you are at risk.  If you do not have HIV, but are at risk, it may be recommended that you take a prescription medicine daily to prevent HIV infection. This is called pre-exposure prophylaxis (PrEP). You are considered at risk if:  You are sexually active and do not regularly use condoms or know the HIV status of your partner(s).  You take drugs by injection.  You are sexually active with a partner  who has HIV. Talk with your health care provider about whether you are at high risk of being infected with HIV. If you choose to begin PrEP, you should first be tested for HIV. You should then be tested every 3 months for as long as you are taking PrEP. Pregnancy  If you are premenopausal and you may become pregnant, ask your health care provider about preconception counseling.  If you may become pregnant, take 400 to 800 micrograms (mcg) of folic acid  every day.  If you want to prevent pregnancy, talk to your health care provider about birth control (contraception). Osteoporosis and menopause  Osteoporosis is a disease in which the bones lose minerals and strength with aging. This can result in serious bone fractures. Your risk for osteoporosis can be identified using a bone density scan.  If you are 4 years of age or older, or if you are at risk for osteoporosis and fractures, ask your health care provider if you should be screened.  Ask your health care provider whether you should take a calcium or vitamin D supplement to lower your risk for osteoporosis.  Menopause may have certain physical symptoms and risks.  Hormone replacement therapy may reduce some of these symptoms and risks. Talk to your health care provider about whether hormone replacement therapy is right for you. Follow these instructions at home:  Schedule regular health, dental, and eye exams.  Stay current with your immunizations.  Do not use any tobacco products including cigarettes, chewing tobacco, or electronic cigarettes.  If you are pregnant, do not drink alcohol.  If you are breastfeeding, limit how much and how often you drink alcohol.  Limit alcohol intake to no more than 1 drink per day for nonpregnant women. One drink equals 12 ounces of beer, 5 ounces of wine, or 1 ounces of hard liquor.  Do not use street drugs.  Do not share needles.  Ask your health care provider for help if you need support  or information about quitting drugs.  Tell your health care provider if you often feel depressed.  Tell your health care provider if you have ever been abused or do not feel safe at home. This information is not intended to replace advice given to you by your health care provider. Make sure you discuss any questions you have with your health care provider. Document Released: 09/22/2010 Document Revised: 08/15/2015 Document Reviewed: 12/11/2014 Elsevier Interactive Patient Education  2017 [employer].

## 2016-06-03 NOTE — Assessment & Plan Note (Signed)
Try taking your Lexapro in the am instead of the pm If no improvement, will increase Lexapro to 20 mg daily

## 2016-06-03 NOTE — Progress Notes (Signed)
Subjective:    Patient ID: Carrie Thompson, female    DOB: [birth year], 26 y.o.   MRN: [id number]  HPI  Pt presents to the clinic today for her annual exam. She also wants to discuss her anxiety and depression. She does not feel like her current dose of Lexapro is getting her through the day. She started to get irritable about 3 pm. She is taking her Lexapro before bed.  Flu: never Tetanus: 2014 Pap Smear: 2017 at [facility] Dentist: as needed  Diet: She does eat meat. She consumes fruits and veggies daily. She does eat some fried foods. She drinks mostly water and coffee. Exercise: She does workouts at home for 30-45 minutes 3 times a week.  Review of Systems      Past Medical History:  Diagnosis Date  . Abnormal Pap smear   . Anxiety   . Asthma   . Chicken pox   . Depression   . Hx of varicella   . 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 Vaginal Pap smear, abnormal     Current Outpatient Prescriptions  Medication Sig Dispense Refill  . escitalopram (LEXAPRO) 10 MG tablet Take 1 tablet (10 mg total) by mouth at bedtime. 30 tablet 5   No current facility-administered medications for this visit.     Allergies  Allergen Reactions  . Shellfish Allergy Anaphylaxis  . Iodine Other (See Comments)    Pt states that she avoids iodine because of her reaction to shellfish.      Family History  Problem Relation Age of Onset  . Other Mother     varicose veins  . Varicose Veins Mother   . Mental illness Mother   . Diabetes Father   . Mental illness Father   . Rheum arthritis Maternal Grandmother   . Hypertension Maternal Grandfather     Social History   Social History  . Marital status: Married    Spouse name: N/A  . Number of children: N/A  . Years of education: N/A   Occupational History  . Not on file.   Social History Main Topics  . Smoking status: Never Smoker  . Smokeless tobacco: Never Used  .  Alcohol use No  . Drug use: No  . Sexual activity: Yes    Birth control/ protection: IUD   Other Topics Concern  . Not on file   Social History Narrative  . No narrative on file     Constitutional: Denies fever, malaise, fatigue, headache or abrupt weight changes.  HEENT: Denies eye pain, eye redness, ear pain, ringing in the ears, wax buildup, runny nose, nasal congestion, bloody nose, or sore throat. Respiratory: Denies difficulty breathing, shortness of breath, cough or sputum production.   Cardiovascular: Denies chest pain, chest tightness, palpitations or swelling in the hands or feet.  Gastrointestinal: Denies abdominal pain, bloating, constipation, diarrhea or blood in the stool.  GU: Denies urgency, frequency, pain with urination, burning sensation, blood in urine, odor or discharge. Musculoskeletal: Denies decrease in range of motion, difficulty with gait, muscle pain or joint pain and swelling.  Skin: Pt reports skin lesion of right hip. Denies redness, rashes, or ulcercations.  Neurological: Denies dizziness, difficulty with memory, difficulty with speech or problems with balance and coordination.  Psych: Pt reports irritability and depression. Denies SI/HI.  No other specific complaints in a complete review of systems (except as listed in HPI above).  Objective:  Physical Exam   BP 116/78   Pulse 79   Temp 97.9 F (36.6 C) (Oral)   Ht 5' 7.5" (1.715 m)   Wt 255 lb (115.7 kg)   SpO2 99%   BMI 39.35 kg/m  Wt Readings from Last 3 Encounters:  06/03/16 255 lb (115.7 kg)  01/06/16 268 lb 8 oz (121.8 kg)  02/06/15 239 lb (108.4 kg)    General: Appears her stated age, obese in NAD. Skin: Warm, dry and intact. Dermatofibroma noted of right hip. HEENT: Head: normal shape and size; Eyes: sclera white, no icterus, conjunctiva pink, PERRLA and EOMs intact; Ears: Tm's gray and intact, normal light reflex;  Throat/Mouth: Teeth present, mucosa pink and moist, no exudate,  lesions or ulcerations noted.  Neck:  Neck supple, trachea midline. No masses, lumps or thyromegaly present.  Cardiovascular: Normal rate and rhythm. S1,S2 noted.  No murmur, rubs or gallops noted. No JVD or BLE edema.  Pulmonary/Chest: Normal effort and positive vesicular breath sounds. No respiratory distress. No wheezes, rales or ronchi noted.  Abdomen: Soft and nontender. Normal bowel sounds. No distention or masses noted. Liver, spleen and kidneys non palpable. Musculoskeletal: Strength 5/5 BUE/BLE. No difficulty with gait.  Neurological: Alert and oriented. Cranial nerves II-XII grossly intact. Coordination normal.  Psychiatric: Mood and affect normal. Behavior is normal. Judgment and thought content normal.    BMET    Component Value Date/Time   NA 139 02/06/2015 1343   K 3.9 02/06/2015 1343   CL 102 02/06/2015 1343   CO2 31 02/06/2015 1343   GLUCOSE 88 02/06/2015 1343   BUN 12 02/06/2015 1343   CREATININE 0.75 02/06/2015 1343   CALCIUM 8.7 02/06/2015 1343   GFRNONAA >90 05/31/2014 2050   GFRAA >90 05/31/2014 2050    Lipid Panel  No results found for: CHOL, TRIG, HDL, CHOLHDL, VLDL, LDLCALC  CBC    Component Value Date/Time   WBC 4.3 02/06/2015 1343   RBC 4.40 02/06/2015 1343   HGB 13.2 02/06/2015 1343   HCT 39.9 02/06/2015 1343   PLT 217.0 02/06/2015 1343   MCV 90.7 02/06/2015 1343   MCH 27.5 06/02/2014 0631   MCHC 33.2 02/06/2015 1343   RDW 12.3 02/06/2015 1343    Hgb A1C No results found for: HGBA1C      Assessment & Plan:   Preventative Health Maintenance:  She declines flu shot today Tetanus UTD Pap Smear UTD Encouraged her to consume a balanced diet and exercise regimen Advised her to see an eye doctor and dentist annually Will check CBC, CMET, Lipid, and A1C today  Dermatofibroma:  No intervention needed Will monitor  RTC in 1 year, sooner if needed Webb Silversmith, NP

## 2016-06-05 ENCOUNTER — Encounter: Payer: Self-pay | Admitting: Internal Medicine

## 2016-06-06 ENCOUNTER — Encounter: Payer: Self-pay | Admitting: Internal Medicine

## 2016-06-08 ENCOUNTER — Encounter: Payer: Self-pay | Admitting: Internal Medicine

## 2016-07-07 ENCOUNTER — Ambulatory Visit: Payer: BLUE CROSS/BLUE SHIELD | Admitting: Internal Medicine

## 2016-07-07 ENCOUNTER — Telehealth: Payer: Self-pay | Admitting: Internal Medicine

## 2016-07-07 NOTE — Telephone Encounter (Signed)
Pt did not show up for their acute visit.  Do you want to charge No Show Fee? She came in at 131 for her appt, and at the time refused to reschedule.  She has since rescheduled via mychart

## 2016-07-08 NOTE — Telephone Encounter (Signed)
Yes, charge no show fee

## 2016-07-09 ENCOUNTER — Ambulatory Visit (INDEPENDENT_AMBULATORY_CARE_PROVIDER_SITE_OTHER): Payer: BLUE CROSS/BLUE SHIELD | Admitting: Internal Medicine

## 2016-07-09 ENCOUNTER — Encounter: Payer: Self-pay | Admitting: Internal Medicine

## 2016-07-09 VITALS — BP 118/80 | HR 85 | Temp 97.9°F | Wt 260.0 lb

## 2016-07-09 DIAGNOSIS — J301 Allergic rhinitis due to pollen: Secondary | ICD-10-CM | POA: Diagnosis not present

## 2016-07-09 MED ORDER — FLUTICASONE PROPIONATE 50 MCG/ACT NA SUSP: 2.0000 | Freq: Every day | NASAL | 6 refills | Status: DC

## 2016-07-09 MED ORDER — CETIRIZINE HCL 10 MG PO TABS: 10.0000 mg | ORAL_TABLET | Freq: Every day | ORAL | 11 refills | Status: DC

## 2016-07-09 NOTE — Progress Notes (Signed)
HPI  Pt presents to the clinic today with c/o runny nose, post nasal drip and cough. This started 1 week ago. She is blowing clear mucous out of her nose. She denies difficulty swallowing. The cough is non productive. She does reports chest pressure with coughing but denies chest pain or shortness of breath. She denies headache, facial pain and pressure, nasal congestion or ear pain. She has not tried anything OTC. She has no history of allergies but does have asthma. She has not had sick contacts that she is aware of.   Review of Systems        Past Medical History:  Diagnosis Date  . Abnormal Pap smear   . Anxiety   . Asthma   . Chicken pox   . Depression   . Hx of varicella   . 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 Vaginal Pap smear, abnormal     Family History  Problem Relation Age of Onset  . Other Mother     varicose veins  . Varicose Veins Mother   . Mental illness Mother   . Diabetes Father   . Mental illness Father   . Rheum arthritis Maternal Grandmother   . Hypertension Maternal Grandfather     Social History   Social History  . Marital status: Married    Spouse name: N/A  . Number of children: N/A  . Years of education: N/A   Occupational History  . Not on file.   Social History Main Topics  . Smoking status: Never Smoker  . Smokeless tobacco: Never Used  . Alcohol use No  . Drug use: No  . Sexual activity: Yes    Birth control/ protection: IUD   Other Topics Concern  . Not on file   Social History Narrative  . No narrative on file    Allergies  Allergen Reactions  . Shellfish Allergy Anaphylaxis  . Iodine Other (See Comments)    Pt states that she avoids iodine because of her reaction to shellfish.       Constitutional:  Denies headache, fatigue, fever or abrupt weight changes.  HEENT:  Positive runny nose. Denies eye redness, eye pain, pressure behind the eyes, facial pain, nasal congestion,  ear pain, ringing in the ears, wax buildup, runny nose or sore throat. Respiratory: Positive cough. Denies difficulty breathing or shortness of breath.  Cardiovascular: Denies chest pain, chest tightness, palpitations or swelling in the hands or feet.   No other specific complaints in a complete review of systems (except as listed in HPI above).  Objective:   BP 118/80   Pulse 85   Temp 97.9 F (36.6 C) (Oral)   Wt 260 lb (117.9 kg)   SpO2 98%   BMI 40.12 kg/m  Wt Readings from Last 3 Encounters:  07/09/16 260 lb (117.9 kg)  06/03/16 255 lb (115.7 kg)  01/06/16 268 lb 8 oz (121.8 kg)     General: Appears her stated age, in NAD. HEENT: Head: normal shape and size, no sinus tenderness noted; Ears: Tm's gray and intact, normal light reflex; Nose: mucosa pink and moist, septum midline; Throat/Mouth: + PND. Teeth present, mucosa pink and moist, no exudate noted, no lesions or ulcerations noted.  Neck: No cervical lymphadenopathy.  Cardiovascular: Normal rate and rhythm. S1,S2 noted.  No murmur, rubs or gallops noted.  Pulmonary/Chest: Normal effort and positive vesicular breath sounds. No respiratory distress. No wheezes, rales or ronchi  noted.       Assessment & Plan:   Allergic Rhinitis:  Get some rest and drink plenty of water eRx for Zyrtec and Flonase  Work note provided  RTC as needed or if symptoms persist.   Webb Silversmith, NP

## 2016-07-09 NOTE — Patient Instructions (Signed)
Allergic Rhinitis Allergic rhinitis is when the mucous membranes in the nose respond to allergens. Allergens are particles in the air that cause your body to have an allergic reaction. This causes you to release allergic antibodies. Through a chain of events, these eventually cause you to release histamine into the blood stream. Although meant to protect the body, it is this release of histamine that causes your discomfort, such as frequent sneezing, congestion, and an itchy, runny nose. What are the causes? Seasonal allergic rhinitis (hay fever) is caused by pollen allergens that may come from grasses, trees, and weeds. Year-round allergic rhinitis (perennial allergic rhinitis) is caused by allergens such as house dust mites, pet dander, and mold spores. What are the signs or symptoms?  Nasal stuffiness (congestion).  Itchy, runny nose with sneezing and tearing of the eyes. How is this diagnosed? Your health care provider can help you determine the allergen or allergens that trigger your symptoms. If you and your health care provider are unable to determine the allergen, skin or blood testing may be used. Your health care provider will diagnose your condition after taking your health history and performing a physical exam. Your health care provider may assess you for other related conditions, such as asthma, pink eye, or an ear infection. How is this treated? Allergic rhinitis does not have a cure, but it can be controlled by:  Medicines that block allergy symptoms. These may include allergy shots, nasal sprays, and oral antihistamines.  Avoiding the allergen. Hay fever may often be treated with antihistamines in pill or nasal spray forms. Antihistamines block the effects of histamine. There are over-the-counter medicines that may help with nasal congestion and swelling around the eyes. Check with your health care provider before taking or giving this medicine. If avoiding the allergen or the  medicine prescribed do not work, there are many new medicines your health care provider can prescribe. Stronger medicine may be used if initial measures are ineffective. Desensitizing injections can be used if medicine and avoidance does not work. Desensitization is when a patient is given ongoing shots until the body becomes less sensitive to the allergen. Make sure you follow up with your health care provider if problems continue. Follow these instructions at home: It is not possible to completely avoid allergens, but you can reduce your symptoms by taking steps to limit your exposure to them. It helps to know exactly what you are allergic to so that you can avoid your specific triggers. Contact a health care provider if:  You have a fever.  You develop a cough that does not stop easily (persistent).  You have shortness of breath.  You start wheezing.  Symptoms interfere with normal daily activities. This information is not intended to replace advice given to you by your health care provider. Make sure you discuss any questions you have with your health care provider. Document Released: 12/02/2000 Document Revised: 11/08/2015 Document Reviewed: 11/14/2012 Elsevier Interactive Patient Education  2017 Elsevier Inc.  

## 2016-09-20 ENCOUNTER — Telehealth: Payer: BLUE CROSS/BLUE SHIELD | Admitting: Family

## 2016-09-20 DIAGNOSIS — H60331 Swimmer's ear, right ear: Secondary | ICD-10-CM

## 2016-09-20 MED ORDER — CIPROFLOXACIN HCL 500 MG PO TABS: 500.0000 mg | ORAL_TABLET | Freq: Two times a day (BID) | ORAL | 0 refills | Status: DC

## 2016-09-20 MED ORDER — CIPROFLOXACIN-HYDROCORTISONE 0.2-1 % OT SUSP: 3.0000 [drp] | Freq: Two times a day (BID) | OTIC | 0 refills | Status: AC

## 2016-09-20 NOTE — Progress Notes (Signed)
Thank you for the details you put in the comment boxes. Those details really help [place] take better care of you.    E Visit for Swimmer's Ear  We are sorry that you are not feeling well. Here is how we plan to help!  Based on what you have shared with me it looks like you have swimmers ear. Swimmer's ear is a redness or swelling, irritation, or infection of your outer ear canal.  These symptoms usually occur within a few days of swimming.  Your ear canal is a tube that goes from the opening of the ear to the eardrum.  When water stays in your ear canal, germs can grow.  This is a painful condition that often happens to children and swimmers of all ages.  It is not contagious and oral antibiotics are not required to treat uncomplicated swimmer's ear.  The usual symptoms include: Itching inside the ear, Redness or a sense of swelling in the ear, Pain when the ear is tugged on when pressure is placed on the ear, Pus draining from the infected ear. and I have prescribed: Ciprofloxin 0.2% and hydrocortisone 1% otic suspension 3 drops in affected ears twice daily for 10 days  Based on what you have told me you may have a bacterial infection. In addition to the ear drops I have prescribed an oral antibiotic: and Cipro 500mg , one tablet my mouth twice a day for 10 days  In certain cases swimmer's ear may progress to a more serious bacterial infection of the middle or inner ear.  If you have a fever 102 and up and significantly worsening symptoms, this could indicate a more serious infection moving to the middle/inner and needs face to face evaluation in an office by a provider.  Your symptoms should improve over the next 3 days and should resolve in about 7 days.  HOME CARE:   Wash your hands frequently.  Do not place the tip of the bottle on your ear or touch it with your fingers.  You can take Acetominophen 650 mg every 4-6 hours as needed for pain.  If pain is severe or moderate, you can apply a  heating pad (set on low) or hot water bottle (wrapped in a towel) to outer ear for 20 minutes.  This will also increase drainage.  Avoid ear plugs  Do not use Q-tips  After showers, help the water run out by tilting your head to one side.  GET HELP RIGHT AWAY IF:   Fever is over 102.2 degrees.  You develop progressive ear pain or hearing loss.  Ear symptoms persist longer than 3 days after treatment.  MAKE SURE YOU:   Understand these instructions.  Will watch your condition.  Will get help right away if you are not doing well or get worse.  TO PREVENT SWIMMER'S EAR:  Use a bathing cap or custom fitted swim molds to keep your ears dry.  Towel off after swimming to dry your ears.  Tilt your head or pull your earlobes to allow the water to escape your ear canal.  If there is still water in your ears, consider using a hairdryer on the lowest setting.  Thank you for choosing an e-visit. Your e-visit answers were reviewed by a board certified advanced clinical practitioner to complete your personal care plan. Depending upon the condition, your plan could have included both over the counter or prescription medications. Please review your pharmacy choice. Be sure that the pharmacy you have chosen  is open so that you can pick up your prescription now.  If there is a problem you may message your provider in [place] to have the prescription routed to another pharmacy. Your safety is important to [place]. If you have drug allergies check your prescription carefully.  For the next 24 hours, you can use MyChart to ask questions about today's visit, request a non-urgent call back, or ask for a work or school excuse from your e-visit provider. You will get an email in the next two days asking about your experience. I hope that your e-visit has been valuable and will speed your recovery.

## 2016-09-21 MED ORDER — OFLOXACIN 0.3 % OT SOLN: 10.0000 [drp] | Freq: Every day | OTIC | 0 refills | Status: DC

## 2016-09-21 NOTE — Addendum Note (Signed)
Addended by: Evelina Dun A on: 09/21/2016 01:17 PM   Modules accepted: Orders

## 2016-09-22 NOTE — Progress Notes (Signed)
Patient was told may be having allergic reaction to something. May need to go to er or urgent care if having trouble breathing.

## 2016-09-22 NOTE — Progress Notes (Signed)
Attempted to call patient to discuss symptoms but got no answer.

## 2016-09-22 NOTE — Progress Notes (Signed)
Not sure what is going on with patient. Her original evisit was for swimmers ear and was prescribed cipro drops. It has been 2 days and  she is no better. She was told should see her PCP at this point

## 2016-10-13 ENCOUNTER — Encounter: Payer: Self-pay | Admitting: Internal Medicine

## 2016-10-13 ENCOUNTER — Ambulatory Visit (INDEPENDENT_AMBULATORY_CARE_PROVIDER_SITE_OTHER): Payer: BLUE CROSS/BLUE SHIELD | Admitting: Internal Medicine

## 2016-10-13 VITALS — BP 124/76 | HR 87 | Temp 98.0°F | Wt 250.0 lb

## 2016-10-13 DIAGNOSIS — M25551 Pain in right hip: Secondary | ICD-10-CM

## 2016-10-13 DIAGNOSIS — R635 Abnormal weight gain: Secondary | ICD-10-CM

## 2016-10-13 MED ORDER — PHENTERMINE HCL 37.5 MG PO TABS: 37.5000 mg | ORAL_TABLET | Freq: Every morning | ORAL | 1 refills | Status: DC

## 2016-10-13 NOTE — Patient Instructions (Signed)
Exercising to Lose Weight Exercising can help you to lose weight. In order to lose weight through exercise, you need to do vigorous-intensity exercise. You can tell that you are exercising with vigorous intensity if you are breathing very hard and fast and cannot hold a conversation while exercising. Moderate-intensity exercise helps to maintain your current weight. You can tell that you are exercising at a moderate level if you have a higher heart rate and faster breathing, but you are still able to hold a conversation. How often should I exercise? Choose an activity that you enjoy and set realistic goals. Your health care provider can help you to make an activity plan that works for you. Exercise regularly as directed by your health care provider. This may include:  Doing resistance training twice each week, such as: ? Push-ups. ? Sit-ups. ? Lifting weights. ? Using resistance bands.  Doing a given intensity of exercise for a given amount of time. Choose from these options: ? 150 minutes of moderate-intensity exercise every week. ? 75 minutes of vigorous-intensity exercise every week. ? A mix of moderate-intensity and vigorous-intensity exercise every week.  Children, pregnant women, people who are out of shape, people who are overweight, and older adults may need to consult a health care provider for individual recommendations. If you have any sort of medical condition, be sure to consult your health care provider before starting a new exercise program. What are some activities that can help me to lose weight?  Walking at a rate of at least 4.5 miles an hour.  Jogging or running at a rate of 5 miles per hour.  Biking at a rate of at least 10 miles per hour.  Lap swimming.  Roller-skating or in-line skating.  Cross-country skiing.  Vigorous competitive sports, such as football, basketball, and soccer.  Jumping rope.  Aerobic dancing. How can I be more active in my day-to-day  activities?  Use the stairs instead of the elevator.  Take a walk during your lunch break.  If you drive, park your car farther away from work or school.  If you take public transportation, get off one stop early and walk the rest of the way.  Make all of your phone calls while standing up and walking around.  Get up, stretch, and walk around every 30 minutes throughout the day. What guidelines should I follow while exercising?  Do not exercise so much that you hurt yourself, feel dizzy, or get very short of breath.  Consult your health care provider prior to starting a new exercise program.  Wear comfortable clothes and shoes with good support.  Drink plenty of water while you exercise to prevent dehydration or heat stroke. Body water is lost during exercise and must be replaced.  Work out until you breathe faster and your heart beats faster. This information is not intended to replace advice given to you by your health care provider. Make sure you discuss any questions you have with your health care provider. Document Released: 04/11/2010 Document Revised: 08/15/2015 Document Reviewed: 08/10/2013 Elsevier Interactive Patient Education  2018 Elsevier Inc.  

## 2016-10-13 NOTE — Progress Notes (Signed)
Subjective:    Patient ID: Carrie Thompson, female    DOB: [birth year], 26 y.o.   MRN: [id number]  HPI  Pt presents to the clinic today with c/o right hip pain. She reports this started 3 days ago. She reports the pain was deep, and sharp. She denies numbness or tingling. The pain seemed worse with sitting, better with standing. She went to the gym yesterday and stretched her right hip. The pain has since resolved. She has not taken anything OTC for this.  She also wants a refill of her Phentermine. She was started on this in June by GYN. She has lost 28 lbs, but recently gained 3 lbs when she ran out of the medicine.  Review of Systems      Past Medical History:  Diagnosis Date  . Abnormal Pap smear   . Anxiety   . Asthma   . Chicken pox   . Depression   . Hx of varicella   . 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 Vaginal Pap smear, abnormal     Current Outpatient Prescriptions  Medication Sig Dispense Refill  . cetirizine (ZYRTEC) 10 MG tablet Take 1 tablet (10 mg total) by mouth daily. 30 tablet 11  . ciprofloxacin (CIPRO) 500 MG tablet Take 1 tablet (500 mg total) by mouth 2 (two) times daily. 20 tablet 0  . escitalopram (LEXAPRO) 10 MG tablet Take 1 tablet (10 mg total) by mouth at bedtime. 30 tablet 5  . fluticasone (FLONASE) 50 MCG/ACT nasal spray Place 2 sprays into both nostrils daily. 16 g 6  . ofloxacin (FLOXIN OTIC) 0.3 % OTIC solution Place 10 drops into the right ear daily. 5 mL 0  . phentermine (ADIPEX-P) 37.5 MG tablet Take 37.5 mg by mouth every morning.  0   No current facility-administered medications for this visit.     Allergies  Allergen Reactions  . Shellfish Allergy Anaphylaxis  . Iodine Other (See Comments)    Pt states that she avoids iodine because of her reaction to shellfish.      Family History  Problem Relation Age of Onset  . Other Mother        varicose veins  . Varicose Veins Mother   .  Mental illness Mother   . Diabetes Father   . Mental illness Father   . Rheum arthritis Maternal Grandmother   . Hypertension Maternal Grandfather     Social History   Social History  . Marital status: Married    Spouse name: N/A  . Number of children: N/A  . Years of education: N/A   Occupational History  . Not on file.   Social History Main Topics  . Smoking status: Never Smoker  . Smokeless tobacco: Never Used  . Alcohol use No  . Drug use: No  . Sexual activity: Yes    Birth control/ protection: IUD   Other Topics Concern  . Not on file   Social History Narrative  . No narrative on file     Constitutional: Denies fever, malaise, fatigue, headache or abrupt weight changes.  Musculoskeletal: Pt reports right hip pain, now resolved. Denies decrease in range of motion, difficulty with gait, muscle pain or joint swelling.    No other specific complaints in a complete review of systems (except as listed in HPI above).  Objective:   Physical Exam  BP 124/76   Pulse 87   Temp 98 F (  36.7 C) (Oral)   Wt 250 lb (113.4 kg)   SpO2 98%   BMI 38.58 kg/m  Wt Readings from Last 3 Encounters:  10/13/16 250 lb (113.4 kg)  07/09/16 260 lb (117.9 kg)  06/03/16 255 lb (115.7 kg)    General: Appears her stated age, obese in NAD. Cardiovascular: Normal rate and rhythm.  Musculoskeletal: Normal internal and external rotation of the right hip. Normal flexion, extension, abduction and adduction. No difficulty with gait.   BMET    Component Value Date/Time   NA 136 06/03/2016 1455   K 4.6 06/03/2016 1455   CL 108 06/03/2016 1455   CO2 23 06/03/2016 1455   GLUCOSE 96 06/03/2016 1455   BUN 14 06/03/2016 1455   CREATININE 0.81 06/03/2016 1455   CALCIUM 9.3 06/03/2016 1455   GFRNONAA >90 05/31/2014 2050   GFRAA >90 05/31/2014 2050    Lipid Panel     Component Value Date/Time   CHOL 119 06/03/2016 1455   TRIG 58.0 06/03/2016 1455   HDL 30.30 (L) 06/03/2016 1455    CHOLHDL 4 06/03/2016 1455   VLDL 11.6 06/03/2016 1455   LDLCALC 77 06/03/2016 1455    CBC    Component Value Date/Time   WBC 7.0 06/03/2016 1455   RBC 5.06 06/03/2016 1455   HGB 15.8 (H) 06/03/2016 1455   HCT 47.0 (H) 06/03/2016 1455   PLT 275.0 06/03/2016 1455   MCV 92.9 06/03/2016 1455   MCH 27.5 06/02/2014 0631   MCHC 33.7 06/03/2016 1455   RDW 13.2 06/03/2016 1455    Hgb A1C Lab Results  Component Value Date   HGBA1C 5.5 06/03/2016           Assessment & Plan:   Right Hip Pain:  Resolved Will monitor  Abnormal Weight Gain:  Phentermine refilled today Encouraged her to work on diet and exercise  RTC as needed Webb Silversmith, NP

## 2016-10-14 ENCOUNTER — Encounter: Payer: Self-pay | Admitting: Internal Medicine

## 2016-10-20 ENCOUNTER — Encounter: Payer: Self-pay | Admitting: Internal Medicine

## 2016-10-21 ENCOUNTER — Ambulatory Visit (INDEPENDENT_AMBULATORY_CARE_PROVIDER_SITE_OTHER): Payer: BLUE CROSS/BLUE SHIELD | Admitting: Internal Medicine

## 2016-10-21 ENCOUNTER — Ambulatory Visit (INDEPENDENT_AMBULATORY_CARE_PROVIDER_SITE_OTHER)
Admission: RE | Admit: 2016-10-21 | Discharge: 2016-10-21 | Disposition: A | Discharge status: Home / Self Care | Payer: BLUE CROSS/BLUE SHIELD | Source: Ambulatory Visit | Attending: Internal Medicine | Admitting: Internal Medicine

## 2016-10-21 ENCOUNTER — Encounter: Payer: Self-pay | Admitting: Internal Medicine

## 2016-10-21 VITALS — BP 122/80 | HR 95 | Temp 98.0°F | Wt 241.8 lb

## 2016-10-21 DIAGNOSIS — M25551 Pain in right hip: Secondary | ICD-10-CM | POA: Diagnosis not present

## 2016-10-21 MED ORDER — NAPROXEN 500 MG PO TABS: 500.0000 mg | ORAL_TABLET | Freq: Two times a day (BID) | ORAL | 0 refills | Status: DC

## 2016-10-21 NOTE — Patient Instructions (Addendum)
Hip Bursitis Hip bursitis is swelling of a fluid-filled sac (bursa) in your hip. This swelling (inflammation) can be painful. This condition may come and go over time. Follow these instructions at home: Medicines  Take over-the-counter and prescription medicines only as told by your doctor.  Do not drive or use heavy machinery while taking prescription pain medicine, or as told by your doctor.  If you were prescribed an antibiotic medicine, take it as told by your doctor. Do not stop taking the antibiotic even if you start to feel better. Activity  Return to your normal activities as told by your doctor. Ask your doctor what activities are safe for you.  Rest and protect your hip until you feel better. General instructions  Wear wraps that put pressure on your hip (compression wraps) only as told by your doctor.  Raise (elevate) your hip above the level of your heart as much as you can. To do this, try putting a pillow under your hips while you lie down. Stop if this causes pain.  Do not use your hip to support your body weight until your doctor says that you can.  Use crutches as told by your doctor.  Gently rub and stretch your injured area as often as is comfortable.  Keep all follow-up visits as told by your doctor. This is important. How is this prevented?  Exercise regularly, as told by your doctor.  Warm up and stretch before being active.  Cool down and stretch after being active.  Avoid activities that bother your hip or cause pain.  Avoid sitting down for long periods at a time. Contact a doctor if:  You have a fever.  You get new symptoms.  You have trouble walking.  You have trouble doing everyday activities.  You have pain that gets worse.  You have pain that does not get better with medicine.  You get red skin on your hip area.  You get a feeling of warmth in your hip area. Get help right away if:  You cannot move your hip.  You have very bad  pain. This information is not intended to replace advice given to you by your health care provider. Make sure you discuss any questions you have with your health care provider. Document Released: 04/11/2010 Document Revised: 08/15/2015 Document Reviewed: 10/09/2014 Elsevier Interactive Patient Education  2018 Elsevier Inc.  

## 2016-10-21 NOTE — Progress Notes (Signed)
Subjective:    Patient ID: Carrie Thompson, female    DOB: 12/21/1990, 26 y.o.   MRN: [id number]  HPI  Pt presents to the clinic today with c/o recurrent right hip pain. She reports this initially occurred on 7/21. It resolved spontanesouly with stretching. She reports the pain reoccured starting 2 days ago. She describes the pain as throbbing. The pain radiates down her leg. She denies numbness or tingling down her leg. The pan seems worse with sitting or laying down. She has tried stretching this time without relief. She has not taken any medications OTC for this.  Review of Systems      Past Medical History:  Diagnosis Date  . Abnormal Pap smear   . Anxiety   . Asthma   . Chicken pox   . Depression   . Hx of varicella   . 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 Vaginal Pap smear, abnormal     Current Outpatient Prescriptions  Medication Sig Dispense Refill  . cetirizine (ZYRTEC) 10 MG tablet Take 1 tablet (10 mg total) by mouth daily. 30 tablet 11  . escitalopram (LEXAPRO) 20 MG tablet Take 20 mg by mouth every morning.  6  . fluticasone (FLONASE) 50 MCG/ACT nasal spray Place 2 sprays into both nostrils daily. 16 g 6  . phentermine (ADIPEX-P) 37.5 MG tablet Take 1 tablet (37.5 mg total) by mouth every morning. 30 tablet 1  . topiramate (TOPAMAX) 25 MG tablet   0   No current facility-administered medications for this visit.     Allergies  Allergen Reactions  . Shellfish Allergy Anaphylaxis  . Iodine Other (See Comments)    Pt states that she avoids iodine because of her reaction to shellfish.      Family History  Problem Relation Age of Onset  . Other Mother        varicose veins  . Varicose Veins Mother   . Mental illness Mother   . Diabetes Father   . Mental illness Father   . Rheum arthritis Maternal Grandmother   . Hypertension Maternal Grandfather     Social History   Social History  . Marital status: Married      Spouse name: N/A  . Number of children: N/A  . Years of education: N/A   Occupational History  . Not on file.   Social History Main Topics  . Smoking status: Never Smoker  . Smokeless tobacco: Never Used  . Alcohol use No  . Drug use: No  . Sexual activity: Yes    Birth control/ protection: IUD   Other Topics Concern  . Not on file   Social History Narrative  . No narrative on file     Constitutional: Denies fever, malaise, fatigue, headache or abrupt weight changes.  Musculoskeletal: Pt reports right hip pain. Denies decrease in range of motion, difficulty with gait, muscle pain or joint swelling.  Neurological: Denies dizziness, difficulty with memory, difficulty with speech or problems with balance and coordination.    No other specific complaints in a complete review of systems (except as listed in HPI above).  Objective:   Physical Exam  BP 122/80   Pulse 95   Temp 98 F (36.7 C) (Oral)   Wt 241 lb 12 oz (109.7 kg)   SpO2 98%   BMI 37.30 kg/m  Wt Readings from Last 3 Encounters:  10/21/16 241 lb 12 oz (109.7 kg)  10/13/16 250 lb (113.4 kg)  07/09/16 260 lb (117.9 kg)    General: Appears her stated age, obese in NAD. Musculoskeletal: Normal flexion, extension, rotation and lateral bending of the spine. No bony tenderness noted over the spine or SI Joints. Pain with palpation over the right trochanteric bursa. Normal flexion, extension, internal and external rotation of the right hip. Normal abduction and adduction of the right hip. Strength 5/5 BLE. No difficulty with gait.  Neurological: Alert and oriented. Sensation intact to BLE.   BMET    Component Value Date/Time   NA 136 06/03/2016 1455   K 4.6 06/03/2016 1455   CL 108 06/03/2016 1455   CO2 23 06/03/2016 1455   GLUCOSE 96 06/03/2016 1455   BUN 14 06/03/2016 1455   CREATININE 0.81 06/03/2016 1455   CALCIUM 9.3 06/03/2016 1455   GFRNONAA >90 05/31/2014 2050   GFRAA >90 05/31/2014 2050     Lipid Panel     Component Value Date/Time   CHOL 119 06/03/2016 1455   TRIG 58.0 06/03/2016 1455   HDL 30.30 (L) 06/03/2016 1455   CHOLHDL 4 06/03/2016 1455   VLDL 11.6 06/03/2016 1455   LDLCALC 77 06/03/2016 1455    CBC    Component Value Date/Time   WBC 7.0 06/03/2016 1455   RBC 5.06 06/03/2016 1455   HGB 15.8 (H) 06/03/2016 1455   HCT 47.0 (H) 06/03/2016 1455   PLT 275.0 06/03/2016 1455   MCV 92.9 06/03/2016 1455   MCH 27.5 06/02/2014 0631   MCHC 33.7 06/03/2016 1455   RDW 13.2 06/03/2016 1455    Hgb A1C Lab Results  Component Value Date   HGBA1C 5.5 06/03/2016            Assessment & Plan:   Right Hip Pain:  Likely trochanteric bursitis eRx for Naproxen 500 mg BID x 1 week Hip exercises given She is requesting xray today, ordered  Will follow up after xray, return precautions discussed Webb Silversmith, NP

## 2016-11-11 ENCOUNTER — Encounter: Payer: Self-pay | Admitting: Internal Medicine

## 2016-12-22 ENCOUNTER — Other Ambulatory Visit: Payer: Self-pay | Admitting: Internal Medicine

## 2017-01-08 ENCOUNTER — Telehealth: Payer: BLUE CROSS/BLUE SHIELD | Admitting: Family

## 2017-01-08 DIAGNOSIS — B3731 Acute candidiasis of vulva and vagina: Secondary | ICD-10-CM

## 2017-01-08 DIAGNOSIS — B373 Candidiasis of vulva and vagina: Secondary | ICD-10-CM

## 2017-01-08 DIAGNOSIS — J028 Acute pharyngitis due to other specified organisms: Secondary | ICD-10-CM

## 2017-01-08 DIAGNOSIS — B9689 Other specified bacterial agents as the cause of diseases classified elsewhere: Secondary | ICD-10-CM

## 2017-01-08 MED ORDER — FLUCONAZOLE 150 MG PO TABS: 150.0000 mg | ORAL_TABLET | Freq: Once | ORAL | 0 refills | Status: AC

## 2017-01-08 MED ORDER — BENZONATATE 100 MG PO CAPS: 100.0000 mg | ORAL_CAPSULE | Freq: Three times a day (TID) | ORAL | 0 refills | Status: DC | PRN

## 2017-01-08 MED ORDER — AZITHROMYCIN 250 MG PO TABS: ORAL_TABLET | ORAL | 0 refills | Status: DC

## 2017-01-08 NOTE — Progress Notes (Signed)
Thank you for the details you included in the comment boxes. Those details are very helpful in determining the best course of treatment for you and help [place] to provide the best care. This will treat the sinus and cough symptoms.   We are sorry that you are not feeling well.  Here is how we plan to help!  Based on your presentation I believe you most likely have A cough due to bacteria.  When patients have a fever and a productive cough with a change in color or increased sputum production, we are concerned about bacterial bronchitis.  If left untreated it can progress to pneumonia.  If your symptoms do not improve with your treatment plan it is important that you contact your provider.   I have prescribed Azithromyin 250 mg: two tablets now and then one tablet daily for 4 additonal days    In addition you may use A non-prescription cough medication called Mucinex DM: take 2 tablets every 12 hours. and A prescription cough medication called Tessalon Perles 100mg . You may take 1-2 capsules every 8 hours as needed for your cough.  From your responses in the eVisit questionnaire you describe inflammation in the upper respiratory tract which is causing a significant cough.  This is commonly called Bronchitis and has four common causes:    Allergies  Viral Infections  Acid Reflux  Bacterial Infection Allergies, viruses and acid reflux are treated by controlling symptoms or eliminating the cause. An example might be a cough caused by taking certain blood pressure medications. You stop the cough by changing the medication. Another example might be a cough caused by acid reflux. Controlling the reflux helps control the cough.  USE OF BRONCHODILATOR ("RESCUE") INHALERS: There is a risk from using your bronchodilator too frequently.  The risk is that over-reliance on a medication which only relaxes the muscles surrounding the breathing tubes can reduce the effectiveness of medications prescribed to reduce  swelling and congestion of the tubes themselves.  Although you feel brief relief from the bronchodilator inhaler, your asthma may actually be worsening with the tubes becoming more swollen and filled with mucus.  This can delay other crucial treatments, such as oral steroid medications. If you need to use a bronchodilator inhaler daily, several times per day, you should discuss this with your provider.  There are probably better treatments that could be used to keep your asthma under control.     HOME CARE . Only take medications as instructed by your medical team. . Complete the entire course of an antibiotic. . Drink plenty of fluids and get plenty of rest. . Avoid close contacts especially the very young and the elderly . Cover your mouth if you cough or cough into your sleeve. . Always remember to wash your hands . A steam or ultrasonic humidifier can help congestion.   GET HELP RIGHT AWAY IF: . You develop worsening fever. . You become short of breath . You cough up blood. . Your symptoms persist after you have completed your treatment plan MAKE SURE YOU   Understand these instructions.  Will watch your condition.  Will get help right away if you are not doing well or get worse.  Your e-visit answers were reviewed by a board certified advanced clinical practitioner to complete your personal care plan.  Depending on the condition, your plan could have included both over the counter or prescription medications. If there is a problem please reply  once you have received a response  from your provider. Your safety is important to [place].  If you have drug allergies check your prescription carefully.    You can use MyChart to ask questions about today's visit, request a non-urgent call back, or ask for a work or school excuse for 24 hours related to this e-Visit. If it has been greater than 24 hours you will need to follow up with your provider, or enter a new e-Visit to address those  concerns. You will get an e-mail in the next two days asking about your experience.  I hope that your e-visit has been valuable and will speed your recovery. Thank you for using e-visits.

## 2017-01-08 NOTE — Addendum Note (Signed)
Addended by: Benjamine Mola on: 01/08/2017 10:42 AM   Modules accepted: Orders

## 2017-03-09 ENCOUNTER — Ambulatory Visit: Payer: BLUE CROSS/BLUE SHIELD | Admitting: Internal Medicine

## 2017-03-10 ENCOUNTER — Encounter: Payer: Self-pay | Admitting: Internal Medicine

## 2017-03-10 ENCOUNTER — Ambulatory Visit (INDEPENDENT_AMBULATORY_CARE_PROVIDER_SITE_OTHER): Payer: BLUE CROSS/BLUE SHIELD | Admitting: Internal Medicine

## 2017-03-10 VITALS — BP 120/78 | HR 85 | Temp 98.0°F | Wt 228.0 lb

## 2017-03-10 DIAGNOSIS — L639 Alopecia areata, unspecified: Secondary | ICD-10-CM

## 2017-03-11 LAB — TSH: TSH: 0.8 u[IU]/mL (ref 0.35–4.50)

## 2017-03-12 ENCOUNTER — Encounter: Payer: Self-pay | Admitting: Internal Medicine

## 2017-03-12 NOTE — Patient Instructions (Signed)
Alopecia Areata, Adult Alopecia areata is a condition that causes you to lose hair. You may lose hair on your scalp in patches. In some cases, you may lose all the hair on your scalp (alopecia totalis) or all the hair from your face and body (alopecia universalis). Alopecia areata is an autoimmune disease. This means that your body's defense system (immune system) mistakes normal parts of the body for germs or other things that can make you sick. When you have alopecia areata, the immune system attacks the hair follicles. Alopecia areata usually develops in childhood, but it can develop at any age. For some people, their hair grows back on its own and hair loss does not happen again. For others, their hair may fall out and grow back in cycles. The hair loss may last many years. Having this condition can be emotionally difficult, but it is not dangerous. What are the causes? The cause of this condition is not known. What increases the risk? This condition is more likely to develop in people who have:  A family history of alopecia.  A family history of another autoimmune disease, including type 1 diabetes and rheumatoid arthritis.  Asthma and allergies.  Down syndrome.  What are the signs or symptoms? Round spots of patchy hair loss on the scalp is the main symptom of this condition. The spots may be mildly itchy. Other symptoms include:  Short dark hairs in the bald patches that are wider at the top (exclamation point hairs).  Dents, white spots, or lines in the fingernails or toenails.  Balding and body hair loss. This is rare.  How is this diagnosed? This condition is diagnosed based on your symptoms and family history. Your health care provider will also check your scalp skin, teeth, and nails. Your health care provider may refer you to a specialist in hair and skin disorders (dermatologist). You may also have tests, including:  A hair pull test.  Blood tests or other screening tests  to check for autoimmune diseases, such as thyroid disease or diabetes.  Skin biopsy to confirm the diagnosis.  A procedure to examine the skin with a lighted magnifying instrument (dermoscopy).  How is this treated? There is no cure for alopecia areata. Treatment is aimed at promoting the regrowth of hair and preventing the immune system from overreacting. No single treatment is right for all people with alopecia areata. It depends on the type of hair loss you have and how severe it is. Work with your health care provider to find the best treatment for you. Treatment may include:  Having regular checkups to make sure the condition is not getting worse (watchful waiting).  Steroid creams or pills for 6-8 weeks to stop the immune reaction and help hair to regrow more quickly.  Other topical medicines to alter the immune system response and support the hair growth cycle.  Steroid injections.  Therapy and counseling with a support group or therapist if you are having trouble coping with hair loss.  Follow these instructions at home:  Learn as much as you can about your condition.  Apply topical creams only as told by your health care provider.  Take over-the-counter and prescription medicines only as told by your health care provider.  Consider getting a wig or products to make hair look fuller or to cover bald spots, if you feel uncomfortable with your appearance.  Get therapy or counseling if you are having a hard time coping with hair loss. Ask your health care  products to make hair look fuller or to cover bald spots, if you feel uncomfortable with your appearance.   Get therapy or counseling if you are having a hard time coping with hair loss. Ask your health care provider to recommend a counselor or support group.   Keep all follow-up visits as told by your health care provider. This is important.  Contact a health care provider if:   Your hair loss gets worse, even with treatment.   You have new symptoms.   You are struggling emotionally.  Summary   Alopecia areata is an autoimmune condition that makes your body's defense system (immune system) attack the hair follicles. This causes  you to lose hair.   Treatments may include regular checkups to make sure that the condition is not getting worse (watchful waiting), medicines, and steroid injections.  This information is not intended to replace advice given to you by your health care provider. Make sure you discuss any questions you have with your health care provider.  Document Released: 10/12/2003 Document Revised: 03/27/2016 Document Reviewed: 03/27/2016  Elsevier Interactive Patient Education  2018 [employer] Inc[employer]

## 2017-03-12 NOTE — Progress Notes (Signed)
Subjective:    Patient ID: Carrie Thompson, female    DOB: 11/02/1990, 26 y.o.   MRN: [id number]  HPI  Pt presents to the clinic today with c/o a patch of hair loss just above her left ear. She originally noticed this 6 months ago. She has not noticed any rashes of her scalp. The area of hair loss seems to be increasing in size. She has not changed hair products recently. She has not tried anything OTC for her symptoms.  Review of Systems      Past Medical History:  Diagnosis Date  . Abnormal Pap smear   . Anxiety   . Asthma   . Chicken pox   . Depression   . Hx of varicella   . 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 Vaginal Pap smear, abnormal     Current Outpatient Medications  Medication Sig Dispense Refill  . amphetamine-dextroamphetamine (ADDERALL) 30 MG tablet Take 1 tablet by mouth 2 (two) times daily.  0  . cetirizine (ZYRTEC) 10 MG tablet Take 1 tablet (10 mg total) by mouth daily. 30 tablet 11  . fluticasone (FLONASE) 50 MCG/ACT nasal spray Place 2 sprays into both nostrils daily. 16 g 6  . naproxen (NAPROSYN) 500 MG tablet Take 1 tablet (500 mg total) by mouth 2 (two) times daily with a meal. 30 tablet 0  . sertraline (ZOLOFT) 50 MG tablet TAKE HALF TABLET BY MOUTH ONCE A DAY FOR 7 DAYS, THEN 1 TABLET ONCE A DAY  0   No current facility-administered medications for this visit.     Allergies  Allergen Reactions  . Shellfish Allergy Anaphylaxis  . Iodine Other (See Comments)    Pt states that she avoids iodine because of her reaction to shellfish.      Family History  Problem Relation Age of Onset  . Other Mother        varicose veins  . Varicose Veins Mother   . Mental illness Mother   . Diabetes Father   . Mental illness Father   . Rheum arthritis Maternal Grandmother   . Hypertension Maternal Grandfather     Social History   Socioeconomic History  . Marital status: Married    Spouse name: Not on file  .  Number of children: Not on file  . Years of education: Not on file  . Highest education level: Not on file  Social Needs  . Financial resource strain: Not on file  . Food insecurity - worry: Not on file  . Food insecurity - inability: Not on file  . Transportation needs - medical: Not on file  . Transportation needs - non-medical: Not on file  Occupational History  . Not on file  Tobacco Use  . Smoking status: Never Smoker  . Smokeless tobacco: Never Used  Substance and Sexual Activity  . Alcohol use: No    Alcohol/week: 0.0 oz  . Drug use: No  . Sexual activity: Yes    Birth control/protection: IUD  Other Topics Concern  . Not on file  Social History Narrative  . Not on file     Constitutional: Denies fever, malaise, fatigue, headache or abrupt weight changes.  Respiratory: Denies difficulty breathing, shortness of breath, cough or sputum production.   Cardiovascular: Denies chest pain, chest tightness, palpitations or swelling in the hands or feet.   Skin: Pt reports hair loss. Denies redness, rashes, lesions or ulcercations.  No other specific complaints in a complete review of systems (except as listed in HPI above).  Objective:   Physical Exam  BP 120/78   Pulse 85   Temp 98 F (36.7 C) (Oral)   Wt 228 lb (103.4 kg)   SpO2 98%   BMI 35.18 kg/m  Wt Readings from Last 3 Encounters:  03/10/17 228 lb (103.4 kg)  10/21/16 241 lb 12 oz (109.7 kg)  10/13/16 250 lb (113.4 kg)    General: Appears her stated age, in NAD. Skin: Warm, dry and intact. 2.5 cm round area of smooth hair loss. Evidence of breakage noted. No rashes identified.  BMET    Component Value Date/Time   NA 136 06/03/2016 1455   K 4.6 06/03/2016 1455   CL 108 06/03/2016 1455   CO2 23 06/03/2016 1455   GLUCOSE 96 06/03/2016 1455   BUN 14 06/03/2016 1455   CREATININE 0.81 06/03/2016 1455   CALCIUM 9.3 06/03/2016 1455   GFRNONAA >90 05/31/2014 2050   GFRAA >90 05/31/2014 2050    Lipid  Panel     Component Value Date/Time   CHOL 119 06/03/2016 1455   TRIG 58.0 06/03/2016 1455   HDL 30.30 (L) 06/03/2016 1455   CHOLHDL 4 06/03/2016 1455   VLDL 11.6 06/03/2016 1455   LDLCALC 77 06/03/2016 1455    CBC    Component Value Date/Time   WBC 7.0 06/03/2016 1455   RBC 5.06 06/03/2016 1455   HGB 15.8 (H) 06/03/2016 1455   HCT 47.0 (H) 06/03/2016 1455   PLT 275.0 06/03/2016 1455   MCV 92.9 06/03/2016 1455   MCH 27.5 06/02/2014 0631   MCHC 33.7 06/03/2016 1455   RDW 13.2 06/03/2016 1455    Hgb A1C Lab Results  Component Value Date   HGBA1C 5.5 06/03/2016            Assessment & Plan:   Alopecia Areata:  TSH today Referred to dermatology to discuss intralesional steroid injection  Return precautions discussed Webb Silversmith, NP

## 2017-03-14 ENCOUNTER — Other Ambulatory Visit: Payer: Self-pay | Admitting: Internal Medicine

## 2017-03-28 ENCOUNTER — Encounter: Payer: Self-pay | Admitting: Internal Medicine

## 2017-03-29 NOTE — Telephone Encounter (Signed)
Pt calling today regarding this note. Would like a call from Lena.

## 2017-03-30 ENCOUNTER — Ambulatory Visit: Payer: BLUE CROSS/BLUE SHIELD | Admitting: Primary Care

## 2017-03-30 ENCOUNTER — Encounter: Payer: Self-pay | Admitting: Primary Care

## 2017-03-30 VITALS — BP 128/68 | HR 84 | Temp 98.0°F | Wt 229.2 lb

## 2017-03-30 DIAGNOSIS — J069 Acute upper respiratory infection, unspecified: Secondary | ICD-10-CM | POA: Diagnosis not present

## 2017-03-30 MED ORDER — BENZONATATE 200 MG PO CAPS: 200.0000 mg | ORAL_CAPSULE | Freq: Three times a day (TID) | ORAL | 0 refills | Status: DC | PRN

## 2017-03-30 NOTE — Progress Notes (Signed)
Subjective:    Patient ID: Carrie Thompson, female    DOB: January 10, 1991, 27 y.o.   MRN: [id number]  HPI  Carrie Thompson is a 27 year old female with a history of childhood asthma who presents today with a chief complaint of cough. She also reports chest congestion, sneezing, fatigue, voice hoarseness. She's been taking Nyquil without improvement. Her symptoms began one week ago. Overall she's feeling better, but the cough hasn't improved.   Review of Systems  Constitutional: Negative for chills, fatigue and fever.  HENT: Positive for congestion, sneezing and voice change. Negative for sinus pressure.   Respiratory: Positive for cough.   Cardiovascular: Negative for chest pain.       Past Medical History:  Diagnosis Date  . Abnormal Pap smear   . Anxiety   . Asthma   . Chicken pox   . Depression   . Hx of varicella   . 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 Vaginal Pap smear, abnormal      Social History   Socioeconomic History  . Marital status: Married    Spouse name: Not on file  . Number of children: Not on file  . Years of education: Not on file  . Highest education level: Not on file  Social Needs  . Financial resource strain: Not on file  . Food insecurity - worry: Not on file  . Food insecurity - inability: Not on file  . Transportation needs - medical: Not on file  . Transportation needs - non-medical: Not on file  Occupational History  . Not on file  Tobacco Use  . Smoking status: Never Smoker  . Smokeless tobacco: Never Used  Substance and Sexual Activity  . Alcohol use: No    Alcohol/week: 0.0 oz  . Drug use: No  . Sexual activity: Yes    Birth control/protection: IUD  Other Topics Concern  . Not on file  Social History Narrative  . Not on file    No past surgical history on file.  Family History  Problem Relation Age of Onset  . Other Mother        varicose veins  . Varicose Veins Mother   . Mental  illness Mother   . Diabetes Father   . Mental illness Father   . Rheum arthritis Maternal Grandmother   . Hypertension Maternal Grandfather     Allergies  Allergen Reactions  . Shellfish Allergy Anaphylaxis  . Iodine Other (See Comments)    Pt states that she avoids iodine because of her reaction to shellfish.      Current Outpatient Medications on File Prior to Visit  Medication Sig Dispense Refill  . amphetamine-dextroamphetamine (ADDERALL) 30 MG tablet Take 1 tablet by mouth 2 (two) times daily.  0  . cetirizine (ZYRTEC) 10 MG tablet Take 1 tablet (10 mg total) by mouth daily. 30 tablet 11  . fluticasone (FLONASE) 50 MCG/ACT nasal spray Place 2 sprays into both nostrils daily. 16 g 6  . naproxen (NAPROSYN) 500 MG tablet TAKE 1 TABLET (500 MG TOTAL) BY MOUTH 2 (TWO) TIMES DAILY WITH A MEAL. 30 tablet 0  . sertraline (ZOLOFT) 50 MG tablet TAKE HALF TABLET BY MOUTH ONCE A DAY FOR 7 DAYS, THEN 1 TABLET ONCE A DAY  0   No current facility-administered medications on file prior to visit.     BP 128/68   Pulse 84   Temp 98 F (  36.7 C) (Oral)   Wt 229 lb 4 oz (104 kg)   SpO2 97%   BMI 35.38 kg/m    Objective:   Physical Exam  Constitutional: She appears well-nourished.  HENT:  Right Ear: Tympanic membrane and ear canal normal.  Left Ear: Tympanic membrane and ear canal normal.  Nose: Mucosal edema present. Right sinus exhibits no maxillary sinus tenderness and no frontal sinus tenderness. Left sinus exhibits no maxillary sinus tenderness and no frontal sinus tenderness.  Mouth/Throat: Oropharynx is clear and moist.  Eyes: Conjunctivae are normal.  Neck: Neck supple.  Cardiovascular: Normal rate and regular rhythm.  Pulmonary/Chest: Effort normal and breath sounds normal. She has no wheezes. She has no rales.  Lymphadenopathy:    She has no cervical adenopathy.  Skin: Skin is warm and dry.          Assessment & Plan:  URI:  Cough, chest congestion, fatigue x 1  week, overall feeling better except for cough. Exam today with clear lungs, doesn't appear acutely ill, vitals stable. Suspect viral involvement and will treat with conservative measures. Rx for [employer] and antihistamine. She will call Friday if no improvement. Fluids, rest.  Sheral Flow, NP

## 2017-03-30 NOTE — Patient Instructions (Signed)
Your symptoms are representative of a viral illness which will resolve on its own over time. Our goal is to treat your symptoms in order to aid your body in the healing process and to make you more comfortable.   You may take Benzonatate capsules for cough. Take 1 capsule by mouth three times daily as needed for cough.  Start a separate antihistamine such as Claritin, Allegra, Zyrtec.   Stop Nyquil.  Please call or message me Friday this week if no improvement.  It was a pleasure meeting you!   Upper Respiratory Infection, Adult Most upper respiratory infections (URIs) are caused by a virus. A URI affects the nose, throat, and upper air passages. The most common type of URI is often called "the common cold." Follow these instructions at home:  Take medicines only as told by your doctor.  Gargle warm saltwater or take cough drops to comfort your throat as told by your doctor.  Use a warm mist humidifier or inhale steam from a shower to increase air moisture. This may make it easier to breathe.  Drink enough fluid to keep your pee (urine) clear or pale yellow.  Eat soups and other clear broths.  Have a healthy diet.  Rest as needed.  Go back to work when your fever is gone or your doctor says it is okay. ? You may need to stay home longer to avoid giving your URI to others. ? You can also wear a face mask and wash your hands often to prevent spread of the virus.  Use your inhaler more if you have asthma.  Do not use any tobacco products, including cigarettes, chewing tobacco, or electronic cigarettes. If you need help quitting, ask your doctor. Contact a doctor if:  You are getting worse, not better.  Your symptoms are not helped by medicine.  You have chills.  You are getting more short of breath.  You have brown or red mucus.  You have yellow or brown discharge from your nose.  You have pain in your face, especially when you bend forward.  You have a  fever.  You have puffy (swollen) neck glands.  You have pain while swallowing.  You have white areas in the back of your throat. Get help right away if:  You have very bad or constant: ? Headache. ? Ear pain. ? Pain in your forehead, behind your eyes, and over your cheekbones (sinus pain). ? Chest pain.  You have long-lasting (chronic) lung disease and any of the following: ? Wheezing. ? Long-lasting cough. ? Coughing up blood. ? A change in your usual mucus.  You have a stiff neck.  You have changes in your: ? Vision. ? Hearing. ? Thinking. ? Mood. This information is not intended to replace advice given to you by your health care provider. Make sure you discuss any questions you have with your health care provider. Document Released: 08/26/2007 Document Revised: 11/10/2015 Document Reviewed: 06/14/2013 Elsevier Interactive Patient Education  2018 [employer].

## 2017-04-02 ENCOUNTER — Encounter: Payer: Self-pay | Admitting: Primary Care

## 2017-04-02 DIAGNOSIS — J069 Acute upper respiratory infection, unspecified: Secondary | ICD-10-CM

## 2017-04-02 MED ORDER — AZITHROMYCIN 250 MG PO TABS: ORAL_TABLET | ORAL | 0 refills | Status: DC

## 2017-06-16 ENCOUNTER — Ambulatory Visit: Payer: Self-pay | Admitting: Emergency Medicine

## 2017-06-16 VITALS — BP 140/85 | HR 104 | Temp 98.7°F | Resp 16 | Wt 241.2 lb

## 2017-06-16 DIAGNOSIS — H66002 Acute suppurative otitis media without spontaneous rupture of ear drum, left ear: Secondary | ICD-10-CM

## 2017-06-16 DIAGNOSIS — J309 Allergic rhinitis, unspecified: Secondary | ICD-10-CM

## 2017-06-16 MED ORDER — MONTELUKAST SODIUM 10 MG PO TABS: 10.0000 mg | ORAL_TABLET | Freq: Every day | ORAL | 0 refills | Status: DC

## 2017-06-16 MED ORDER — AMOXICILLIN-POT CLAVULANATE 875-125 MG PO TABS: 1.0000 | ORAL_TABLET | Freq: Two times a day (BID) | ORAL | 0 refills | Status: DC

## 2017-06-16 NOTE — Patient Instructions (Signed)
Otitis Media, Adult Otitis media is redness, soreness, and puffiness (swelling) in the space just behind your eardrum (middle ear). It may be caused by allergies or infection. It often happens along with a cold. Follow these instructions at home:  Take your medicine as told. Finish it even if you start to feel better.  Only take over-the-counter or prescription medicines for pain, discomfort, or fever as told by your doctor.  Follow up with your doctor as told. Contact a doctor if:  You have otitis media only in one ear, or bleeding from your nose, or both.  You notice a lump on your neck.  You are not getting better in 3-5 days.  You feel worse instead of better. Get help right away if:  You have pain that is not helped with medicine.  You have puffiness, redness, or pain around your ear.  You get a stiff neck.  You cannot move part of your face (paralysis).  You notice that the bone behind your ear hurts when you touch it. This information is not intended to replace advice given to you by your health care provider. Make sure you discuss any questions you have with your health care provider. Document Released: 08/26/2007 Document Revised: 08/15/2015 Document Reviewed: 10/04/2012 Elsevier Interactive Patient Education  2017 Elsevier Inc.  

## 2017-06-16 NOTE — Progress Notes (Signed)
Subjective   Carrie Thompson, 27 y.o. female, presents with right ear pain, congestion, sore throat and swollen glands.  Symptoms started 2 days ago.  She is taking fluids well.  She has past hx of allergies and has been taking zyrtec and flonaseThere are no other significant complaints.  The patient's history has been marked as reviewed and updated as appropriate.  Objective   BP 140/85 (BP Location: Right Arm, Patient Position: Sitting, Cuff Size: Normal)   Pulse (!) 104   Temp 98.7 F (37.1 C) (Oral)   Resp 16   Wt 241 lb 3.2 oz (109.4 kg)   SpO2 98%   BMI 37.22 kg/m   General appearance:  well developed and well nourished  Nasal: Neck:  Mild nasal congestion with clear rhinorrhea Neck is supple  Ears:  External ears are normal Right TM - normal landmarks and coloration Left TM - purulent middle ear fluid  Oropharynx:  Mucous membranes are moist; there is mild erythema of the posterior pharynx  Lungs:  Lungs are clear to auscultation  Heart:  Regular rate and rhythm; no murmurs or rubs  Skin:  No rashes or lesions noted   Assessment   Acute left otitis media  Plan   1) Antibiotics per orders 2) Fluids, acetaminophen as needed 3) Continue Zyrtec and Flonase, will add short course of Singulair 4) Recheck if symptoms persist for 2 or more days, symptoms worsen, or new symptoms develop.

## 2017-06-22 ENCOUNTER — Telehealth: Payer: Self-pay

## 2017-06-22 NOTE — Telephone Encounter (Signed)
Called to f/u with pt to see how she is feeling since her visit with [place] and she states she is feeling fine.

## 2017-07-20 ENCOUNTER — Ambulatory Visit: Payer: Self-pay | Admitting: Nurse Practitioner

## 2017-07-20 ENCOUNTER — Encounter: Payer: Self-pay | Admitting: Nurse Practitioner

## 2017-07-20 VITALS — BP 128/78 | HR 94 | Temp 98.8°F | Wt 244.8 lb

## 2017-07-20 DIAGNOSIS — B9789 Other viral agents as the cause of diseases classified elsewhere: Secondary | ICD-10-CM

## 2017-07-20 DIAGNOSIS — J069 Acute upper respiratory infection, unspecified: Secondary | ICD-10-CM

## 2017-07-20 MED ORDER — ALBUTEROL SULFATE HFA 108 (90 BASE) MCG/ACT IN AERS: 2.0000 | INHALATION_SPRAY | Freq: Four times a day (QID) | RESPIRATORY_TRACT | 0 refills | Status: DC | PRN

## 2017-07-20 MED ORDER — BENZONATATE 200 MG PO CAPS: 200.0000 mg | ORAL_CAPSULE | Freq: Two times a day (BID) | ORAL | 0 refills | Status: DC | PRN

## 2017-07-20 NOTE — Progress Notes (Signed)
Subjective:  Carrie Thompson is a 27 y.o. female who presents for evaluation of URI like symptoms.  Symptoms include fever: suspected fevers but not measured at home, nasal congestion, nausea without vomiting, productive cough with yellow colored sputum and sore throat.  Onset of symptoms was 2 days ago, and has been gradually worsening since that time.  Treatment to date:  antihistamines and nasal steroids.  High risk factors for influenza complications:  none.  The following portions of the patient's history were reviewed and updated as appropriate:  allergies, current medications and past medical history.  Constitutional: positive for anorexia, fatigue and sweats, negative for fevers and malaise Eyes: negative Ears, nose, mouth, throat, and face: positive for nasal congestion and sore throat, negative for ear drainage and earaches Respiratory: positive for cough, sputum and wheezing, negative for asthma, chronic bronchitis, emphysema, pleurisy/chest pain, pneumonia and stridor Cardiovascular: negative Gastrointestinal: positive for nausea and bloating, negative for abdominal pain, constipation, diarrhea and vomiting Neurological: positive for headaches, negative for coordination problems, dizziness, paresthesia, speech problems, vertigo and weakness Allergic/Immunologic: positive for hay fever Objective:  BP 128/78   Pulse 94   Temp 98.8 F (37.1 C)   Wt 244 lb 12.8 oz (111 kg)   SpO2 98%   BMI 37.78 kg/m  General appearance: alert, cooperative, fatigued and no distress Head: Normocephalic, without obvious abnormality, atraumatic Eyes: conjunctivae/corneas clear. PERRL, EOM's intact. Fundi benign. Ears: normal TM's and external ear canals both ears Nose: no discharge, turbinates swollen, inflamed, no sinus tenderness Throat: lips, mucosa, and tongue normal; teeth and gums normal Lungs: clear to auscultation bilaterally Heart: regular rate and rhythm, S1, S2 normal, no murmur, click, rub  or gallop Abdomen: soft, non-tender; bowel sounds normal; no masses,  no organomegaly Pulses: 2+ and symmetric Skin: Skin color, texture, turgor normal. No rashes or lesions Lymph nodes: cervical and submandibular nodes normal Neurologic: Grossly normal    Assessment:  viral upper respiratory illness    Plan:  Discussed diagnosis and treatment of URI. Discussed the importance of avoiding unnecessary antibiotic therapy. Educational material distributed and questions answered. Suggested symptomatic OTC remedies. Supportive care with appropriate antipyretics and fluids. Nasal saline spray for congestion. Albuterol Inhaler and Tessalon Perles per orders. Nasal steroids per orders. Follow up as needed.  Meds ordered this encounter  Medications  . benzonatate (TESSALON) 200 MG capsule    Sig: Take 1 capsule (200 mg total) by mouth 2 (two) times daily as needed for cough.    Dispense:  20 capsule    Refill:  0    Order Specific Question:   Supervising Provider    Answer:   Ricard Dillon [[id number]]  . albuterol (PROVENTIL HFA;VENTOLIN HFA) 108 (90 Base) MCG/ACT inhaler    Sig: Inhale 2 puffs into the lungs every 6 (six) hours as needed for up to 7 days for wheezing or shortness of breath.    Dispense:  1 Inhaler    Refill:  0    Order Specific Question:   Supervising Provider    Answer:   Ricard Dillon [phone]

## 2017-07-20 NOTE — Patient Instructions (Signed)

## 2017-07-22 ENCOUNTER — Telehealth: Payer: Self-pay

## 2017-07-22 NOTE — Telephone Encounter (Signed)
I left a message to the patient asking to call us back. 

## 2017-08-04 ENCOUNTER — Encounter: Payer: Self-pay | Admitting: Internal Medicine

## 2017-08-04 ENCOUNTER — Ambulatory Visit: Payer: Self-pay | Admitting: Internal Medicine

## 2017-08-04 ENCOUNTER — Ambulatory Visit (INDEPENDENT_AMBULATORY_CARE_PROVIDER_SITE_OTHER)
Admission: RE | Admit: 2017-08-04 | Discharge: 2017-08-04 | Disposition: A | Discharge status: Home / Self Care | Payer: Self-pay | Source: Ambulatory Visit | Attending: Internal Medicine | Admitting: Internal Medicine

## 2017-08-04 VITALS — BP 124/84 | HR 108 | Temp 98.1°F | Wt 246.0 lb

## 2017-08-04 DIAGNOSIS — M79631 Pain in right forearm: Secondary | ICD-10-CM

## 2017-08-04 NOTE — Progress Notes (Signed)
Subjective:    Patient ID: Carrie Thompson, female    DOB: [birth year], 27 y.o.   MRN: [id number]  HPI  Pt presents to the clinic today with c/o right wrist pain. She reports she fell about 1 month ago while playing roller derby. She has had pain and intermittent swelling since that time. She describes the pain as constant, dull ache, and intermittent sharp pain and pressure with certain movements like buckling her seatbelt. She does have associated tingling in her fingers. The tingling seems worse at night. She has not tried anything OTC for her symptoms. She has broken both her wrists in the past.  Review of Systems      Past Medical History:  Diagnosis Date  . Abnormal Pap smear   . Anxiety   . Asthma   . Chicken pox   . Depression   . Hx of varicella   . 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 Vaginal Pap smear, abnormal     Current Outpatient Medications  Medication Sig Dispense Refill  . amoxicillin-clavulanate (AUGMENTIN) 875-125 MG tablet Take 1 tablet by mouth 2 (two) times daily. 20 tablet 0  . amphetamine-dextroamphetamine (ADDERALL) 30 MG tablet Take 1 tablet by mouth 2 (two) times daily.  0  . benzonatate (TESSALON) 200 MG capsule Take 1 capsule (200 mg total) by mouth 2 (two) times daily as needed for cough. 20 capsule 0  . cetirizine (ZYRTEC) 10 MG tablet Take 1 tablet (10 mg total) by mouth daily. 30 tablet 11  . fluticasone (FLONASE) 50 MCG/ACT nasal spray Place 2 sprays into both nostrils daily. 16 g 6  . montelukast (SINGULAIR) 10 MG tablet Take 1 tablet (10 mg total) by mouth at bedtime. 30 tablet 0  . naproxen (NAPROSYN) 500 MG tablet TAKE 1 TABLET (500 MG TOTAL) BY MOUTH 2 (TWO) TIMES DAILY WITH A MEAL. 30 tablet 0  . sertraline (ZOLOFT) 50 MG tablet TAKE HALF TABLET BY MOUTH ONCE A DAY FOR 7 DAYS, THEN 1 TABLET ONCE A DAY  0  . albuterol (PROVENTIL HFA;VENTOLIN HFA) 108 (90 Base) MCG/ACT inhaler Inhale 2 puffs into the  lungs every 6 (six) hours as needed for up to 7 days for wheezing or shortness of breath. 1 Inhaler 0   No current facility-administered medications for this visit.     Allergies  Allergen Reactions  . Shellfish Allergy Anaphylaxis  . Iodine Other (See Comments)    Pt states that she avoids iodine because of her reaction to shellfish.      Family History  Problem Relation Age of Onset  . Other Mother        varicose veins  . Varicose Veins Mother   . Mental illness Mother   . Diabetes Father   . Mental illness Father   . Rheum arthritis Maternal Grandmother   . Hypertension Maternal Grandfather     Social History   Socioeconomic History  . Marital status: Married    Spouse name: Not on file  . Number of children: Not on file  . Years of education: Not on file  . Highest education level: Not on file  Occupational History  . Not on file  Social Needs  . Financial resource strain: Not on file  . Food insecurity:    Worry: Not on file    Inability: Not on file  . Transportation needs:    Medical: Not on file  Non-medical: Not on file  Tobacco Use  . Smoking status: Never Smoker  . Smokeless tobacco: Never Used  Substance and Sexual Activity  . Alcohol use: No    Alcohol/week: 0.0 oz  . Drug use: No  . Sexual activity: Yes    Birth control/protection: IUD  Lifestyle  . Physical activity:    Days per week: Not on file    Minutes per session: Not on file  . Stress: Not on file  Relationships  . Social connections:    Talks on phone: Not on file    Gets together: Not on file    Attends religious service: Not on file    Active member of club or organization: Not on file    Attends meetings of clubs or organizations: Not on file    Relationship status: Not on file  . Intimate partner violence:    Fear of current or ex partner: Not on file    Emotionally abused: Not on file    Physically abused: Not on file    Forced sexual activity: Not on file  Other  Topics Concern  . Not on file  Social History Narrative  . Not on file     Constitutional: Denies fever, malaise, fatigue, headache or abrupt weight changes.  Musculoskeletal: Pt reports right wrist pain and swelling. Denies decrease in range of motion, difficulty with gait, muscle pain.  Skin: Denies redness, rashes, lesions or ulcercations.  Neurological: Pt reports tingling in right hand. Denies numbness, weakness.  No other specific complaints in a complete review of systems (except as listed in HPI above).  Objective:   Physical Exam   BP 124/84   Pulse (!) 108   Temp 98.1 F (36.7 C) (Oral)   Wt 246 lb (111.6 kg)   SpO2 99%   BMI 37.96 kg/m  Wt Readings from Last 3 Encounters:  08/04/17 246 lb (111.6 kg)  07/20/17 244 lb 12.8 oz (111 kg)  06/16/17 241 lb 3.2 oz (109.4 kg)    General: Appears her stated age, obese in NAD. Cardiovascular:  Radial pulse 2+ bilaterally. Musculoskeletal: Decreased flexion and rotation of the right wrist due to pain. Normal extension. Swelling noted over the distal forearm. Pain with palpation over the distal radius. Hand grips equal. Neurological: Alert and oriented. Sensation intact to BUE. Positive Phalen's, positive Tinel's.    BMET    Component Value Date/Time   NA 136 06/03/2016 1455   K 4.6 06/03/2016 1455   CL 108 06/03/2016 1455   CO2 23 06/03/2016 1455   GLUCOSE 96 06/03/2016 1455   BUN 14 06/03/2016 1455   CREATININE 0.81 06/03/2016 1455   CALCIUM 9.3 06/03/2016 1455   GFRNONAA >90 05/31/2014 2050   GFRAA >90 05/31/2014 2050    Lipid Panel     Component Value Date/Time   CHOL 119 06/03/2016 1455   TRIG 58.0 06/03/2016 1455   HDL 30.30 (L) 06/03/2016 1455   CHOLHDL 4 06/03/2016 1455   VLDL 11.6 06/03/2016 1455   LDLCALC 77 06/03/2016 1455    CBC    Component Value Date/Time   WBC 7.0 06/03/2016 1455   RBC 5.06 06/03/2016 1455   HGB 15.8 (H) 06/03/2016 1455   HCT 47.0 (H) 06/03/2016 1455   PLT 275.0  06/03/2016 1455   MCV 92.9 06/03/2016 1455   MCH 27.5 06/02/2014 0631   MCHC 33.7 06/03/2016 1455   RDW 13.2 06/03/2016 1455    Hgb A1C Lab Results  Component Value Date  HGBA1C 5.5 06/03/2016           Assessment & Plan:   Right Forearm Pain:  She is concerned about fracture, xray negative ? Tendonitis due to overuse Will try Naproxen 500 mg BID with meals Ace wrap applied Encouraged ice and elevation No roller derby for now Can have her follow up with Dr. Lorelei Pont if pain persists or worsens  Return precautions discussed Webb Silversmith, NP

## 2017-08-05 ENCOUNTER — Encounter: Payer: Self-pay | Admitting: Internal Medicine

## 2017-08-05 ENCOUNTER — Telehealth: Payer: Self-pay

## 2017-08-05 MED ORDER — NAPROXEN 500 MG PO TABS: 500.0000 mg | ORAL_TABLET | Freq: Two times a day (BID) | ORAL | 0 refills | Status: DC

## 2017-08-05 NOTE — Addendum Note (Signed)
Addended by: Jearld Fenton on: 08/05/2017 09:19 AM   Modules accepted: Orders

## 2017-08-05 NOTE — Patient Instructions (Signed)
RICE for Routine Care of Injuries Many injuries can be cared for using rest, ice, compression, and elevation (RICE therapy). Using RICE therapy can help to lessen pain and swelling. It can help your body to heal. Rest Reduce your normal activities and avoid using the injured part of your body. You can go back to your normal activities when you feel okay and your doctor says it is okay. Ice Do not put ice on your bare skin.  Put ice in a plastic bag.  Place a towel between your skin and the bag.  Leave the ice on for 20 minutes, 2-3 times a day.  Do this for as long as told by your doctor. Compression Compression means putting pressure on the injured area. This can be done with an elastic bandage. If an elastic bandage has been applied:  Remove and reapply the bandage every 3-4 hours or as told by your doctor.  Make sure the bandage is not wrapped too tight. Wrap the bandage more loosely if part of your body beyond the bandage is blue, swollen, cold, painful, or loses feeling (numb).  See your doctor if the bandage seems to make your problems worse.  Elevation Elevation means keeping the injured area raised. Raise the injured area above your heart or the center of your chest if you can. When should I get help? You should get help if:  You keep having pain and swelling.  Your symptoms get worse.  Get help right away if: You should get help right away if:  You have sudden bad pain at or below the area of your injury.  You have redness or more swelling around your injury.  You have tingling or numbness at or below the injury that does not go away when you take off the bandage.  This information is not intended to replace advice given to you by your health care provider. Make sure you discuss any questions you have with your health care provider. Document Released: 08/26/2007 Document Revised: 02/04/2016 Document Reviewed: 02/14/2014 Elsevier Interactive Patient Education  2017  Elsevier Inc.  

## 2017-08-05 NOTE — Telephone Encounter (Signed)
Copied from [place] [id number]. Topic: Quick Communication - Other Results >> Aug 05, 2017  9:05 AM Synthia Innocent wrote: Requesting xray results from yesterday

## 2017-08-05 NOTE — Telephone Encounter (Signed)
See result note.  

## 2018-01-24 ENCOUNTER — Ambulatory Visit (INDEPENDENT_AMBULATORY_CARE_PROVIDER_SITE_OTHER)
Admission: RE | Admit: 2018-01-24 | Discharge: 2018-01-24 | Disposition: A | Discharge status: Home / Self Care | Payer: Self-pay | Source: Ambulatory Visit | Attending: Family Medicine | Admitting: Family Medicine

## 2018-01-24 ENCOUNTER — Ambulatory Visit: Payer: Self-pay | Admitting: Family Medicine

## 2018-01-24 ENCOUNTER — Encounter: Payer: Self-pay | Admitting: Family Medicine

## 2018-01-24 VITALS — BP 108/78 | HR 81 | Temp 98.3°F | Ht 67.5 in | Wt 276.5 lb

## 2018-01-24 DIAGNOSIS — M79673 Pain in unspecified foot: Secondary | ICD-10-CM

## 2018-01-24 NOTE — Patient Instructions (Signed)
Use the boot when weight bearing.  Don't use it driving.  Wear it for 2 weeks and then update me.   Call us if worse in the meantime.   Take care.  Glad to see you.

## 2018-01-24 NOTE — Progress Notes (Signed)
R foot sx.  Started about 2 days ago.  She jogged a 5K on Saturday and didn't have trouble with that.  No pain during or after that.  No clear trauma.  Had pain Saturday night.  Swelling started last night.  No rolled ankle, no audible pop or snap.  No known injury.  No L foot sx.  Mild pain at rest but clearly more pain with weight bearing.  Painful and puffy along R5th MT.  No FCNAVD.  No medial foot pain.   Has IUD.  No recent LMP.   Meds, vitals, and allergies reviewed.   ROS: Per HPI unless specifically indicated in ROS section   nad R foot with normal inspection w/o erythema or bruising.   Med and lat malleolar not ttp but ttp near the proximal 5th MT.  Normal DP pulse.   Achilles not ttp.  Normal cap refill.  Toes are not ttp.    She clearly felt better when she was placed in a cam walker boot.  X-ray reviewed and discussed with patient.

## 2018-01-26 DIAGNOSIS — M79673 Pain in unspecified foot: Secondary | ICD-10-CM | POA: Insufficient documentation

## 2018-01-26 NOTE — Assessment & Plan Note (Signed)
X-ray reviewed and discussed with patient.  The old third phalanx finding does not appear to be the issue currently.  I do not see evidence of fracture otherwise.  She clearly feels better in a cam walker boot.  This is reasonable to use in the meantime.  If she did have a stress reaction this would be appropriate treatment.  If she did have some type of tendinitis, this would still be an appropriate treatment.  Discussed options.  She will use the boot when weight bearing.  Don't use it driving.  She will wear it for 2 weeks and then update me.   Call us if worse in the meantime.   We can make plans in about 2 weeks.  If she is doing better in the meantime with no pain she may be able to gradually wean out of the boot and return to exercise.

## 2018-06-02 ENCOUNTER — Other Ambulatory Visit: Payer: Self-pay

## 2018-06-02 ENCOUNTER — Emergency Department (HOSPITAL_COMMUNITY)
Admission: EM | Admit: 2018-06-02 | Discharge: 2018-06-02 | Disposition: A | Discharge status: Home / Self Care | Payer: Self-pay | Attending: Emergency Medicine | Admitting: Emergency Medicine

## 2018-06-02 ENCOUNTER — Telehealth: Payer: Self-pay

## 2018-06-02 ENCOUNTER — Encounter: Payer: Self-pay | Admitting: Internal Medicine

## 2018-06-02 DIAGNOSIS — Z79899 Other long term (current) drug therapy: Secondary | ICD-10-CM | POA: Insufficient documentation

## 2018-06-02 DIAGNOSIS — F909 Attention-deficit hyperactivity disorder, unspecified type: Secondary | ICD-10-CM | POA: Insufficient documentation

## 2018-06-02 DIAGNOSIS — B9789 Other viral agents as the cause of diseases classified elsewhere: Secondary | ICD-10-CM

## 2018-06-02 DIAGNOSIS — J45909 Unspecified asthma, uncomplicated: Secondary | ICD-10-CM | POA: Insufficient documentation

## 2018-06-02 DIAGNOSIS — J069 Acute upper respiratory infection, unspecified: Secondary | ICD-10-CM | POA: Insufficient documentation

## 2018-06-02 MED ORDER — BENZONATATE 100 MG PO CAPS: 100.0000 mg | ORAL_CAPSULE | Freq: Three times a day (TID) | ORAL | 0 refills | Status: DC

## 2018-06-02 NOTE — ED Provider Notes (Signed)
[facility] EMERGENCY DEPARTMENT Provider Note   CSN: [id number] Arrival date & time: 06/02/18  1804    History   Chief Complaint Chief Complaint  Patient presents with  . Influenza    HPI Carrie Thompson is a 28 y.o. female.     The history is provided by the patient and medical records. No language interpreter was used.   Carrie Thompson is a 28 y.o. female who presents to the Emergency Department complaining of sore throat which began this morning associated with cough and congestion.  She denies any fevers.  Denies any chest pain or shortness of breath.  Not taken any medications prior to arrival for symptoms.  She states that her job will not let her return back to work until she is tested for coronavirus given she has any cough.  Since husband has also had URI symptoms and traveled to [name] and [place] for work.  She herself denies any travel.  She denies any sick contacts other than her husband.    Past Medical History:  Diagnosis Date  . Abnormal Pap smear   . Anxiety   . Asthma   . Chicken pox   . Depression   . Hx of varicella   . 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 Vaginal Pap smear, abnormal     Patient Active Problem List   Diagnosis Date Noted  . Pain of foot 01/26/2018  . ADD (attention deficit disorder) 01/06/2016  . Childhood asthma 10/10/2014  . Anxiety and depression 10/10/2014  . Pseudotumor cerebri 10/10/2014    No past surgical history on file.   OB History    Gravida  3   Para  3   Term  3   Preterm      AB      Living  3     SAB      TAB      Ectopic      Multiple  0   Live Births  3            Home Medications    Prior to Admission medications   Medication Sig Start Date End Date Taking? Authorizing Provider  amphetamine-dextroamphetamine (ADDERALL) 30 MG tablet Take 1 tablet by mouth 2 (two) times daily. 03/03/17   [provider]   benzonatate (TESSALON) 100 MG capsule Take 1 capsule (100 mg total) by mouth every 8 (eight) hours. 06/02/18   Ward, Ozella Almond, PA-C  busPIRone (BUSPAR) 15 MG tablet Take 15 mg by mouth daily as needed.    [provider]  cetirizine (ZYRTEC) 10 MG tablet Take 1 tablet (10 mg total) by mouth daily. 07/09/16   Jearld Fenton, NP  naproxen (NAPROSYN) 500 MG tablet Take 1 tablet (500 mg total) by mouth 2 (two) times daily with a meal. 08/05/17   Baity, Coralie Keens, NP  sertraline (ZOLOFT) 100 MG tablet Take 100 mg by mouth daily.    [provider]  traZODone (DESYREL) 100 MG tablet Take 150 mg by mouth at bedtime. 07/26/17   [provider]    Family History Family History  Problem Relation Age of Onset  . Other Mother        varicose veins  . Varicose Veins Mother   . Mental illness Mother   . Diabetes Father   . Mental illness Father   . Rheum arthritis Maternal Grandmother   .  Hypertension Maternal Grandfather     Social History Social History   Tobacco Use  . Smoking status: Never Smoker  . Smokeless tobacco: Never Used  Substance Use Topics  . Alcohol use: No    Alcohol/week: 0.0 standard drinks  . Drug use: No     Allergies   Shellfish allergy and Iodine   Review of Systems Review of Systems  Constitutional: Negative for chills and fever.  HENT: Positive for congestion and sore throat.   Respiratory: Positive for cough. Negative for shortness of breath and wheezing.   Cardiovascular: Negative for chest pain.  Gastrointestinal: Negative for abdominal pain, nausea and vomiting.  Musculoskeletal: Negative for myalgias.  Skin: Negative for rash.     Physical Exam Updated Vital Signs BP 139/87 (BP Location: Right Arm)   Pulse 100   Temp 98.4 F (36.9 C) (Oral)   Resp 16   SpO2 97%   Physical Exam Vitals signs and nursing note reviewed.  Constitutional:      General: She is not in acute distress.    Appearance: She is  well-developed.     Comments: Well-appearing.  HENT:     Head: Normocephalic and atraumatic.     Mouth/Throat:     Comments: OP with erythema, but no tonsillar hypertrophy or exudates. Neck:     Musculoskeletal: Neck supple.  Cardiovascular:     Heart sounds: Normal heart sounds. No murmur.     Comments: Regular rate and rhythm on exam. Pulmonary:     Effort: Pulmonary effort is normal. No respiratory distress.     Breath sounds: Normal breath sounds.     Comments: Lungs clear to auscultation bilaterally. Abdominal:     General: There is no distension.     Palpations: Abdomen is soft.     Tenderness: There is no abdominal tenderness.  Skin:    General: Skin is warm and dry.  Neurological:     Mental Status: She is alert and oriented to person, place, and time.      ED Treatments / Results  Labs (all labs ordered are listed, but only abnormal results are displayed) Labs Reviewed - No data to display  EKG None  Radiology No results found.  Procedures Procedures (including critical care time)  Medications Ordered in ED Medications - No data to display   Initial Impression / Assessment and Plan / ED Course  I have reviewed the triage vital signs and the nursing notes.  Pertinent labs & imaging results that were available during my care of the patient were reviewed by me and considered in my medical decision making (see chart for details).       Carrie Thompson is a 28 y.o. female who presents to ED for sore throat, cough congestion..   On exam, patient is afebrile, well appearing with a clear lung exam. Mild rhinorrhea and OP with erythema but no exudates or tonsillar hypertrophy.  Sxs today likely due to viral URI.  She does not meet criteria for covid-19 testing per our guidelines.   Symptomatic home care instructions discussed. Rx for Tessalon given. PCP follow up strongly encouraged if symptoms persist. Reasons to return to ER discussed. All questions  answered.   Blood pressure 139/87, pulse 100, temperature 98.4 F (36.9 C), temperature source Oral, resp. rate 16, SpO2 97 %, currently breastfeeding.   Final Clinical Impressions(s) / ED Diagnoses   Final diagnoses:  Viral URI with cough    ED Discharge Orders  Ordered    benzonatate (TESSALON) 100 MG capsule  Every 8 hours     06/02/18 1852           Ward, Ozella Almond, PA-C 06/02/18 1916    Maudie Flakes, MD 06/04/18 [phone]

## 2018-06-02 NOTE — ED Triage Notes (Signed)
Patient sent by doctor for further evaluation of flu-like symptoms - dry cough, congestion, low-grade fever, sore throat, shortness of breath with exertion. NAD noted in triage, resp e/u, skin w/d.

## 2018-06-02 NOTE — Telephone Encounter (Signed)
Pt said her employer will not let pt return to work until tested for corona virus; pts husband travels to [place] and [place] area with work (sells tools). Pt's husband sees lots of people and to his knowledge has not been around corona virus individual but does not know for sure. pts husband has had body aches and slept a lot today. Pt went to work today but has S/T, runny nose, chest congestion,SOB, cough  And feels hot.pts thermometer not working. Pt said employer advised pt not to come back to work until tested for corona virus. Our office has closed and pt will go to [facility] ED at 6 PM today. I spoke with Dietrich Pates at [facility] ED to make aware of pts arrival time. Pt will go to [facility] ED. FYI to Avie Echevaria NP.

## 2018-06-02 NOTE — Discharge Instructions (Signed)
It was my pleasure taking care of you today!   Your symptoms are likely due to a viral upper respiratory infection. Fortunately, we did not see evidence of serious infection and can treat your symptoms.Flonase and mucinex for nasal congestion, tessalon as needed for cough. Alternate between Tylenol and ibuprofen as needed for body aches / fevers.   Rest, drink plenty of fluids to be sure you are staying hydrated.  Wash hands regularly!  Please follow up with your primary doctor for discussion of your diagnoses and further evaluation after today's visit if symptoms persist longer than 7 days; Return to the ER for high fevers, difficulty breathing or other concerning symptoms

## 2018-06-03 ENCOUNTER — Telehealth: Payer: Self-pay

## 2018-06-03 NOTE — Telephone Encounter (Signed)
ER note reviewed

## 2018-06-03 NOTE — Telephone Encounter (Signed)
Patient called in early this am to report that ER did not test her for the corona virus because she was afebrile.  Her temp now is 99 and she wanted to be sure we were aware.  I reviewed ER note and called her back.  Left detailed message on her VM okay per DPR.    Fever criteria is actually greater than 100.4 and she has to have known exposure to someone who has either traveled to [place] 2 or [place] area or traveled her self there or has been exposed to someone with a positive reading.    I encouraged patient that if she meets the above criteria then she certainly can return to the urgent care/ed over the weekend if needs testing for work before Monday.  She can call us back on Monday am if she has any further questions or concerns and we wish her well and that she recovers quickly.

## 2018-06-03 NOTE — Telephone Encounter (Signed)
Agree with advice given

## 2018-06-14 ENCOUNTER — Encounter: Payer: Self-pay | Admitting: Internal Medicine

## 2018-06-14 ENCOUNTER — Telehealth (INDEPENDENT_AMBULATORY_CARE_PROVIDER_SITE_OTHER): Payer: Self-pay | Admitting: Internal Medicine

## 2018-06-14 ENCOUNTER — Other Ambulatory Visit: Payer: Self-pay

## 2018-06-14 DIAGNOSIS — J029 Acute pharyngitis, unspecified: Secondary | ICD-10-CM

## 2018-06-14 DIAGNOSIS — R05 Cough: Secondary | ICD-10-CM

## 2018-06-14 DIAGNOSIS — R0602 Shortness of breath: Secondary | ICD-10-CM

## 2018-06-14 DIAGNOSIS — R059 Cough, unspecified: Secondary | ICD-10-CM

## 2018-06-14 DIAGNOSIS — R509 Fever, unspecified: Secondary | ICD-10-CM

## 2018-06-14 MED ORDER — HYDROCODONE-HOMATROPINE 5-1.5 MG/5ML PO SYRP: 5.0000 mL | ORAL_SOLUTION | Freq: Three times a day (TID) | ORAL | 0 refills | Status: DC | PRN

## 2018-06-14 MED ORDER — AZITHROMYCIN 250 MG PO TABS: ORAL_TABLET | ORAL | 0 refills | Status: DC

## 2018-06-14 MED ORDER — ALBUTEROL SULFATE HFA 108 (90 BASE) MCG/ACT IN AERS: 2.0000 | INHALATION_SPRAY | Freq: Four times a day (QID) | RESPIRATORY_TRACT | 0 refills | Status: DC | PRN

## 2018-06-14 NOTE — Progress Notes (Signed)
Virtual Visit via Telephone Note  I connected with Carrie Thompson on 06/14/18 at  3:15 PM EDT by telephone and verified that I am speaking with the correct person using two identifiers.   I discussed the limitations, risks, security and privacy concerns of performing an evaluation and management service by telephone and the availability of in person appointments. I also discussed with the patient that there may be a patient responsible charge related to this service. The patient expressed understanding and agreed to proceed.   History of Present Illness: Pt to ER 3/12 with c/o sore throat, cough and chest congestion. Dx with viral URI. Given RX for [employer]. Pt reports persistent sore throat, cough and sweats. She is having some difficulty swallowing. She reports her husband has not noticed any white patches. The cough is non productive. She is short of breath, mostly with exertion. She denies chest pain. She is running low grade fevers. She denies nausea, vomiting or diarrhea. She is taking Dayquil and Nyquil with minimal. She has a history of asthma, but has not had any issues as an adult. She has not had sick contacts that she is aware of.    Observations/Objective: Alert and oriented x 3. No obvious SOB, able to complete sentences. No obvious congestion.    Assessment and Plan:  Sore Throat, Cough, SOB:  DDX Influenza, viral URI with cough, URI, acute bronchitis, PNA, bacterial pharyngitis, viral pharyngitis, COVID ER notes reviewed- she is clearly upset that she can not get COVID testing Advised her to continue Zyrtec, add in Flonase RX for Azithromax, Albuterol and Hycodan If extremely SOB, proceed to the nearest ER Advised her to call the Health Dept to discuss her concerns about COVID testing.   Follow Up Instructions:    I discussed the assessment and treatment plan with the patient. The patient was provided an opportunity to ask questions and all were answered. The patient agreed  with the plan and demonstrated an understanding of the instructions.   The patient was advised to call back or seek an in-person evaluation if the symptoms worsen or if the condition fails to improve as anticipated.  I provided 10  minutes of non-face-to-face time during this encounter.   Webb Silversmith, NP

## 2018-06-14 NOTE — Patient Instructions (Signed)

## 2018-10-14 ENCOUNTER — Encounter: Payer: Self-pay | Admitting: Internal Medicine

## 2018-10-14 ENCOUNTER — Other Ambulatory Visit: Payer: Self-pay

## 2018-10-14 ENCOUNTER — Ambulatory Visit (INDEPENDENT_AMBULATORY_CARE_PROVIDER_SITE_OTHER): Payer: Self-pay | Admitting: Internal Medicine

## 2018-10-14 VITALS — BP 128/84 | HR 94 | Temp 97.9°F | Wt 321.0 lb

## 2018-10-14 DIAGNOSIS — R5383 Other fatigue: Secondary | ICD-10-CM

## 2018-10-14 DIAGNOSIS — R635 Abnormal weight gain: Secondary | ICD-10-CM

## 2018-10-14 NOTE — Patient Instructions (Signed)

## 2018-10-14 NOTE — Progress Notes (Signed)
Subjective:    Patient ID: Carrie Thompson, female    DOB: 08/21/1990, 28 y.o.   MRN: [id number]  HPI  Patient presents to the clinic today to discuss weight gain and possible vitamin deficiencies.  Says she feels "super tired."  She sleeps 10-11 hours daily but still feels exhausted. She first noticed the fatigue about 6 months ago. She does take naps during the day. She takes Adderall and still feels sleepy.  She denies any changes in mood but has started back working more lately.  She is not currently working out.  She endorses not over indulging in bad foods but does eat some.  She has noticed her weight fluctuates more since having children. Her mom has had similar weight issues in the past and has seen huge success in gastric bypass surgery.   Review of Systems      Past Medical History:  Diagnosis Date  . Abnormal Pap smear   . Anxiety   . Asthma   . Chicken pox   . Depression   . Hx of varicella   . 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 Vaginal Pap smear, abnormal     Current Outpatient Medications  Medication Sig Dispense Refill  . albuterol (PROVENTIL HFA;VENTOLIN HFA) 108 (90 Base) MCG/ACT inhaler Inhale 2 puffs into the lungs every 6 (six) hours as needed for wheezing or shortness of breath. 1 Inhaler 0  . amphetamine-dextroamphetamine (ADDERALL) 30 MG tablet Take 1 tablet by mouth 2 (two) times daily.  0  . azithromycin (ZITHROMAX) 250 MG tablet Take 2 tabs today, then 1 tab daily x 4 days 6 tablet 0  . benzonatate (TESSALON) 100 MG capsule Take 1 capsule (100 mg total) by mouth every 8 (eight) hours. (Patient not taking: Reported on 06/14/2018) 21 capsule 0  . busPIRone (BUSPAR) 15 MG tablet Take 15 mg by mouth daily as needed.    . cetirizine (ZYRTEC) 10 MG tablet Take 1 tablet (10 mg total) by mouth daily. 30 tablet 11  . HYDROcodone-homatropine (HYCODAN) 5-1.5 MG/5ML syrup Take 5 mLs by mouth every 8 (eight) hours as  needed. 75 mL 0  . naproxen (NAPROSYN) 500 MG tablet Take 1 tablet (500 mg total) by mouth 2 (two) times daily with a meal. 30 tablet 0  . sertraline (ZOLOFT) 100 MG tablet Take 100 mg by mouth daily.    . traZODone (DESYREL) 100 MG tablet Take 150 mg by mouth at bedtime.  3   No current facility-administered medications for this visit.     Allergies  Allergen Reactions  . Shellfish Allergy Anaphylaxis  . Iodine Other (See Comments)    Pt states that she avoids iodine because of her reaction to shellfish.      Family History  Problem Relation Age of Onset  . Other Mother        varicose veins  . Varicose Veins Mother   . Mental illness Mother   . Diabetes Father   . Mental illness Father   . Rheum arthritis Maternal Grandmother   . Hypertension Maternal Grandfather     Social History   Socioeconomic History  . Marital status: Married    Spouse name: Not on file  . Number of children: Not on file  . Years of education: Not on file  . Highest education level: Not on file  Occupational History  . Not on file  Social Needs  . Financial  resource strain: Not on file  . Food insecurity    Worry: Not on file    Inability: Not on file  . Transportation needs    Medical: Not on file    Non-medical: Not on file  Tobacco Use  . Smoking status: Never Smoker  . Smokeless tobacco: Never Used  Substance and Sexual Activity  . Alcohol use: No    Alcohol/week: 0.0 standard drinks  . Drug use: No  . Sexual activity: Yes    Birth control/protection: I.U.D.  Lifestyle  . Physical activity    Days per week: Not on file    Minutes per session: Not on file  . Stress: Not on file  Relationships  . Social Herbalist on phone: Not on file    Gets together: Not on file    Attends religious service: Not on file    Active member of club or organization: Not on file    Attends meetings of clubs or organizations: Not on file    Relationship status: Not on file  . Intimate  partner violence    Fear of current or ex partner: Not on file    Emotionally abused: Not on file    Physically abused: Not on file    Forced sexual activity: Not on file  Other Topics Concern  . Not on file  Social History Narrative  . Not on file     Constitutional: Complains of abrupt weight gain and fatigue.  Denies fever, malaise, headache  Respiratory: Denies difficulty breathing, shortness of breath, cough or sputum production.   Cardiovascular: Denies chest pain, chest tightness, palpitations or swelling in the hands or feet.  Musculoskeletal: Denies decrease in range of motion, difficulty with gait, muscle pain or joint pain and swelling.  Neurological: Denies dizziness, difficulty with memory, difficulty with speech or problems with balance and coordination.  Psych: Complains of increased stress at work but denies anxiety, depression, SI/HI.  No other specific complaints in a complete review of systems (except as listed in HPI above).   Objective:   Physical Exam  BP 128/84   Pulse 94   Temp 97.9 F (36.6 C) (Temporal)   Wt (!) 321 lb (145.6 kg)   SpO2 98%   BMI 49.53 kg/m    Wt Readings from Last 3 Encounters:  01/24/18 276 lb 8 oz (125.4 kg)  08/04/17 246 lb (111.6 kg)  07/20/17 244 lb 12.8 oz (111 kg)    General: Appears her stated age, morbidly obese in NAD. Skin: Warm, dry and intact. No rashes, lesions or ulcerations noted. Neck:  Neck supple, trachea midline. No masses, lumps or thyromegaly present.  Cardiovascular: Normal rate and rhythm. S1,S2 noted.  No murmur, rubs or gallops noted. No JVD or BLE edema. Pulmonary/Chest: Normal effort and positive vesicular breath sounds. No respiratory distress. No wheezes, rales or ronchi noted.  Psychiatric: Mood and affect normal. Behavior is normal. Judgment and thought content normal.    BMET    Component Value Date/Time   NA 136 06/03/2016 1455   K 4.6 06/03/2016 1455   CL 108 06/03/2016 1455   CO2 23  06/03/2016 1455   GLUCOSE 96 06/03/2016 1455   BUN 14 06/03/2016 1455   CREATININE 0.81 06/03/2016 1455   CALCIUM 9.3 06/03/2016 1455   GFRNONAA >90 05/31/2014 2050   GFRAA >90 05/31/2014 2050    Lipid Panel     Component Value Date/Time   CHOL 119 06/03/2016 1455  TRIG 58.0 06/03/2016 1455   HDL 30.30 (L) 06/03/2016 1455   CHOLHDL 4 06/03/2016 1455   VLDL 11.6 06/03/2016 1455   LDLCALC 77 06/03/2016 1455    CBC    Component Value Date/Time   WBC 7.0 06/03/2016 1455   RBC 5.06 06/03/2016 1455   HGB 15.8 (H) 06/03/2016 1455   HCT 47.0 (H) 06/03/2016 1455   PLT 275.0 06/03/2016 1455   MCV 92.9 06/03/2016 1455   MCH 27.5 06/02/2014 0631   MCHC 33.7 06/03/2016 1455   RDW 13.2 06/03/2016 1455    Hgb A1C Lab Results  Component Value Date   HGBA1C 5.5 06/03/2016           Assessment & Plan:    Abnormal Weight Gain: CBC, CMP, TSH, A1C ordered today. Does not have insurance at this time but would like to be referred to Bariatric Surgery in the future.   Fatigue: ? etiology CBC, CMP, TSH, A1C, Vitamin B12, Vitamin D checked today. Will follow up after labs result.  May consider sleep study if labs normal.   Not a candidate for weight loss medication due to her need for Adderall which she is not willing to give up.  Return precautions discussed.  Webb Silversmith, NP

## 2018-10-15 LAB — COMPREHENSIVE METABOLIC PANEL
AG Ratio: 1.6 (calc) (ref 1.0–2.5)
ALT: 21 U/L (ref 6–29)
AST: 19 U/L (ref 10–30)
Albumin: 3.9 g/dL (ref 3.6–5.1)
Alkaline phosphatase (APISO): 80 U/L (ref 31–125)
BUN: 12 mg/dL (ref 7–25)
CO2: 26 mmol/L (ref 20–32)
Calcium: 9 mg/dL (ref 8.6–10.2)
Chloride: 103 mmol/L (ref 98–110)
Creat: 0.86 mg/dL (ref 0.50–1.10)
Globulin: 2.5 g/dL (calc) (ref 1.9–3.7)
Glucose, Bld: 106 mg/dL — ABNORMAL HIGH (ref 65–99)
Potassium: 4.3 mmol/L (ref 3.5–5.3)
Sodium: 138 mmol/L (ref 135–146)
Total Bilirubin: 0.4 mg/dL (ref 0.2–1.2)
Total Protein: 6.4 g/dL (ref 6.1–8.1)

## 2018-10-15 LAB — CBC
HCT: 40 % (ref 35.0–45.0)
Hemoglobin: 14 g/dL (ref 11.7–15.5)
MCH: 31.8 pg (ref 27.0–33.0)
MCHC: 35 g/dL (ref 32.0–36.0)
MCV: 90.9 fL (ref 80.0–100.0)
MPV: 10.3 fL (ref 7.5–12.5)
Platelets: 315 10*3/uL (ref 140–400)
RBC: 4.4 10*6/uL (ref 3.80–5.10)
RDW: 12.1 % (ref 11.0–15.0)
WBC: 9.9 10*3/uL (ref 3.8–10.8)

## 2018-10-15 LAB — VITAMIN B12: Vitamin B-12: 986 pg/mL (ref 200–1100)

## 2018-10-15 LAB — HEMOGLOBIN A1C
Hgb A1c MFr Bld: 5.5 % of total Hgb (ref <5.7)
Mean Plasma Glucose: 111 (calc)
eAG (mmol/L): 6.2 (calc)

## 2018-10-15 LAB — TSH: TSH: 0.88 mIU/L

## 2018-10-15 LAB — VITAMIN D 25 HYDROXY (VIT D DEFICIENCY, FRACTURES): Vit D, 25-Hydroxy: 27 ng/mL — ABNORMAL LOW (ref 30–100)

## 2019-03-28 ENCOUNTER — Ambulatory Visit (INDEPENDENT_AMBULATORY_CARE_PROVIDER_SITE_OTHER): Payer: BC Managed Care – PPO | Admitting: Internal Medicine

## 2019-03-28 ENCOUNTER — Other Ambulatory Visit: Payer: Self-pay

## 2019-03-28 ENCOUNTER — Encounter: Payer: Self-pay | Admitting: Internal Medicine

## 2019-03-28 VITALS — BP 128/86 | HR 79 | Temp 97.8°F | Wt 332.0 lb

## 2019-03-28 DIAGNOSIS — R635 Abnormal weight gain: Secondary | ICD-10-CM

## 2019-03-28 NOTE — Patient Instructions (Signed)

## 2019-03-28 NOTE — Progress Notes (Signed)
Subjective:    Patient ID: Carrie Thompson, female    DOB: December 19, 1990, 29 y.o.   MRN: [id number]  HPI  Pt presents to the clinic today to discuss weight gain and possible referral for bariatric surgery. Her current weight is 332 lbs with a BMI of 51.23.  Breakfast: Overnight oats, fruit or yogurt Lunch: protein, veggies and carbs Dinner: protein, veggies and carbs Snacks: none  Exercise: [employer] 45 minutes, 3-4 days per week   Review of Systems  Past Medical History:  Diagnosis Date  . Abnormal Pap smear   . Anxiety   . Asthma   . Chicken pox   . Depression   . Hx of varicella   . 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 Vaginal Pap smear, abnormal     Current Outpatient Medications  Medication Sig Dispense Refill  . amphetamine-dextroamphetamine (ADDERALL) 30 MG tablet Take 1 tablet by mouth 2 (two) times daily.  0  . ARIPiprazole (ABILIFY) 5 MG tablet Take 5 mg by mouth daily.    . cetirizine (ZYRTEC) 10 MG tablet Take 1 tablet (10 mg total) by mouth daily. 30 tablet 11  . FLUoxetine (PROZAC) 40 MG capsule Take 40 mg by mouth daily.    . naproxen (NAPROSYN) 500 MG tablet Take 1 tablet (500 mg total) by mouth 2 (two) times daily with a meal. 30 tablet 0  . zolpidem (AMBIEN) 10 MG tablet Take 10 mg by mouth at bedtime as needed for sleep.     No current facility-administered medications for this visit.    Allergies  Allergen Reactions  . Shellfish Allergy Anaphylaxis  . Iodine Other (See Comments)    Pt states that she avoids iodine because of her reaction to shellfish.      Family History  Problem Relation Age of Onset  . Other Mother        varicose veins  . Varicose Veins Mother   . Mental illness Mother   . Diabetes Father   . Mental illness Father   . Rheum arthritis Maternal Grandmother   . Hypertension Maternal Grandfather     Social History   Socioeconomic History  . Marital status: Married   Spouse name: Not on file  . Number of children: Not on file  . Years of education: Not on file  . Highest education level: Not on file  Occupational History  . Not on file  Tobacco Use  . Smoking status: Never Smoker  . Smokeless tobacco: Never Used  Substance and Sexual Activity  . Alcohol use: No    Alcohol/week: 0.0 standard drinks  . Drug use: No  . Sexual activity: Yes    Birth control/protection: I.U.D.  Other Topics Concern  . Not on file  Social History Narrative  . Not on file   Social Determinants of Health   Financial Resource Strain:   . Difficulty of Paying Living Expenses: Not on file  Food Insecurity:   . Worried About Charity fundraiser in the Last Year: Not on file  . Ran Out of Food in the Last Year: Not on file  Transportation Needs:   . Lack of Transportation (Medical): Not on file  . Lack of Transportation (Non-Medical): Not on file  Physical Activity:   . Days of Exercise per Week: Not on file  . Minutes of Exercise per Session: Not on file  Stress:   . Feeling of Stress :  Not on file  Social Connections:   . Frequency of Communication with Friends and Family: Not on file  . Frequency of Social Gatherings with Friends and Family: Not on file  . Attends Religious Services: Not on file  . Active Member of Clubs or Organizations: Not on file  . Attends Archivist Meetings: Not on file  . Marital Status: Not on file  Intimate Partner Violence:   . Fear of Current or Ex-Partner: Not on file  . Emotionally Abused: Not on file  . Physically Abused: Not on file  . Sexually Abused: Not on file     Constitutional: Pt reports weight gain. Denies fever, malaise, fatigue, headache.  Respiratory: Denies difficulty breathing, shortness of breath, cough or sputum production.   Cardiovascular: Denies chest pain, chest tightness, palpitations or swelling in the hands or feet.  Neurological: Denies dizziness, difficulty with memory, difficulty with  speech or problems with balance and coordination.    No other specific complaints in a complete review of systems (except as listed in HPI above).     Objective:   Physical Exam  BP 128/86   Pulse 79   Temp 97.8 F (36.6 C) (Temporal)   Wt (!) 332 lb (150.6 kg)   SpO2 98%   BMI 51.23 kg/m   Wt Readings from Last 3 Encounters:  10/14/18 (!) 321 lb (145.6 kg)  01/24/18 276 lb 8 oz (125.4 kg)  08/04/17 246 lb (111.6 kg)    General: Appears her stated age, obese, in NAD. Cardiovascular: Normal rate and rhythm. S1,S2 noted.  No murmur, rubs or gallops noted.  Pulmonary/Chest: Normal effort and positive vesicular breath sounds. No respiratory distress. No wheezes, rales or ronchi noted.  Neurological: Alert and oriented.     BMET    Component Value Date/Time   NA 138 10/14/2018 1459   K 4.3 10/14/2018 1459   CL 103 10/14/2018 1459   CO2 26 10/14/2018 1459   GLUCOSE 106 (H) 10/14/2018 1459   BUN 12 10/14/2018 1459   CREATININE 0.86 10/14/2018 1459   CALCIUM 9.0 10/14/2018 1459   GFRNONAA >90 05/31/2014 2050   GFRAA >90 05/31/2014 2050    Lipid Panel     Component Value Date/Time   CHOL 119 06/03/2016 1455   TRIG 58.0 06/03/2016 1455   HDL 30.30 (L) 06/03/2016 1455   CHOLHDL 4 06/03/2016 1455   VLDL 11.6 06/03/2016 1455   LDLCALC 77 06/03/2016 1455    CBC    Component Value Date/Time   WBC 9.9 10/14/2018 1459   RBC 4.40 10/14/2018 1459   HGB 14.0 10/14/2018 1459   HCT 40.0 10/14/2018 1459   PLT 315 10/14/2018 1459   MCV 90.9 10/14/2018 1459   MCH 31.8 10/14/2018 1459   MCHC 35.0 10/14/2018 1459   RDW 12.1 10/14/2018 1459    Hgb A1C Lab Results  Component Value Date   HGBA1C 5.5 10/14/2018            Assessment & Plan:  Abnormal Weight Gain:  Encouraged high protein, low carb diet Encouraged at least 150 minutes of exercise weekly Advised her to do the free webinar on the [employer], wait for your packet in the  mail.  RTC in 1 month for weight check  Webb Silversmith, NP This visit occurred during the SARS-CoV-2 public health emergency.  Safety protocols were in place, including screening questions prior to the visit, additional usage of staff PPE, and extensive cleaning of exam room  while observing appropriate contact time as indicated for disinfecting solutions.

## 2019-04-07 ENCOUNTER — Encounter (HOSPITAL_COMMUNITY): Payer: Self-pay

## 2019-04-07 ENCOUNTER — Emergency Department (HOSPITAL_COMMUNITY)
Admission: EM | Admit: 2019-04-07 | Discharge: 2019-04-07 | Disposition: A | Discharge status: Home / Self Care | Payer: BC Managed Care – PPO | Attending: Emergency Medicine | Admitting: Emergency Medicine

## 2019-04-07 ENCOUNTER — Other Ambulatory Visit: Payer: Self-pay

## 2019-04-07 DIAGNOSIS — R0602 Shortness of breath: Secondary | ICD-10-CM | POA: Diagnosis not present

## 2019-04-07 DIAGNOSIS — Z79899 Other long term (current) drug therapy: Secondary | ICD-10-CM | POA: Insufficient documentation

## 2019-04-07 DIAGNOSIS — Z91041 Radiographic dye allergy status: Secondary | ICD-10-CM | POA: Diagnosis not present

## 2019-04-07 DIAGNOSIS — T782XXA Anaphylactic shock, unspecified, initial encounter: Secondary | ICD-10-CM | POA: Insufficient documentation

## 2019-04-07 DIAGNOSIS — Z91013 Allergy to seafood: Secondary | ICD-10-CM | POA: Insufficient documentation

## 2019-04-07 DIAGNOSIS — L299 Pruritus, unspecified: Secondary | ICD-10-CM | POA: Diagnosis not present

## 2019-04-07 LAB — I-STAT BETA HCG BLOOD, ED (MC, WL, AP ONLY): I-stat hCG, quantitative: <5 m[IU]/mL (ref <5)

## 2019-04-07 MED ORDER — DEXAMETHASONE SODIUM PHOSPHATE 10 MG/ML IJ SOLN
10.0000 mg | Freq: Once | INTRAMUSCULAR | Status: AC
  Administered 2019-04-07: 10 mg via INTRAVENOUS
  Filled 2019-04-07: qty 1

## 2019-04-07 MED ORDER — PREDNISONE 20 MG PO TABS: 60.0000 mg | ORAL_TABLET | Freq: Every day | ORAL | 0 refills | Status: AC

## 2019-04-07 MED ORDER — METHYLPREDNISOLONE 4 MG PO TBPK: ORAL_TABLET | ORAL | 0 refills | Status: DC

## 2019-04-07 MED ORDER — DIPHENHYDRAMINE HCL 50 MG/ML IJ SOLN
25.0000 mg | Freq: Once | INTRAMUSCULAR | Status: AC
  Administered 2019-04-07: 25 mg via INTRAVENOUS
  Filled 2019-04-07: qty 1

## 2019-04-07 MED ORDER — EPINEPHRINE 0.3 MG/0.3ML IJ SOAJ
INTRAMUSCULAR | Status: AC
  Administered 2019-04-07: 0.3 mg via INTRAMUSCULAR
  Filled 2019-04-07: qty 0.3

## 2019-04-07 MED ORDER — FAMOTIDINE 20 MG PO TABS: 20.0000 mg | ORAL_TABLET | Freq: Two times a day (BID) | ORAL | 0 refills | Status: DC

## 2019-04-07 MED ORDER — FAMOTIDINE IN NACL 20-0.9 MG/50ML-% IV SOLN
20.0000 mg | Freq: Once | INTRAVENOUS | Status: AC
  Administered 2019-04-07: 20 mg via INTRAVENOUS
  Filled 2019-04-07: qty 50

## 2019-04-07 MED ORDER — EPINEPHRINE 0.3 MG/0.3ML IJ SOAJ: 0.3000 mg | Freq: Once | INTRAMUSCULAR | Status: AC

## 2019-04-07 NOTE — ED Triage Notes (Signed)
Pt in POV for possible allergic reaction to shellfish; pt out to lunch, states restaurant usually does an "ellergy plate", but states "I don't think they did it today; pt able to speak in full sentences, but says she feel like "there's something in my throat"; hx of same, but pt states it has not happened in a very long time; pt carries aviq pen with her, but has not used it; no other meds taken pta; pt also c/o itching to face, face flushed, difficulty swallowing

## 2019-04-07 NOTE — ED Provider Notes (Signed)
Care assumed from [place], please see her note for full details, but in brief Carrie Thompson is a 29 y.o. female who presented with anaphylactic reaction to shellfish, history of the same.  She was experiencing some sore throat as well as urticarial rash, received epi, steroids, Pepcid and Benadryl with significant improvement.  Patient will need to be observed for 4 hours for any recurrence.  Plan: Obs until 5 PM if symptoms remain improved discharge home with EpiPen, steroids and antihistamines.  Physical Exam  BP 129/81   Pulse 74   Temp 98.3 F (36.8 C) (Oral)   Resp (!) 26   Ht 5\' 8"  (1.727 m)   Wt (!) 149.2 kg   SpO2 96%   BMI 50.02 kg/m   Physical Exam Vitals and nursing note reviewed.  Constitutional:      General: She is not in acute distress.    Appearance: She is well-developed. She is not diaphoretic.     Comments: Well-appearing  HENT:     Head: Normocephalic and atraumatic.     Mouth/Throat:     Comments: Facial swelling resolved, able to speak in full sentences, tolerating secretions Eyes:     General:        Right eye: No discharge.        Left eye: No discharge.  Pulmonary:     Effort: Pulmonary effort is normal. No respiratory distress.  Skin:    General: Skin is warm and dry.     Findings: No rash.     Comments: Urticaria resolved  Neurological:     Mental Status: She is alert.     Coordination: Coordination normal.  Psychiatric:        Mood and Affect: Mood normal.        Behavior: Behavior normal.     ED Course/Procedures     Procedures  MDM   29 year old female has been observed for 4 hours after anaphylactic reaction.  She has had complete resolution of her symptoms after epi, antihistamines and steroids, with no recurrence.  She is feeling much better.  She will be discharged home with an EpiPen, and a short course of steroids and Pepcid.  Strict return precautions discussed.  Discharged home in good condition.   Final diagnoses:   Anaphylaxis, initial encounter    ED Discharge Orders         Ordered    predniSONE (DELTASONE) 20 MG tablet  Daily     04/07/19 1607    famotidine (PEPCID) 20 MG tablet  2 times daily     04/07/19 1607            Benedetto Goad [place], [place] 04/07/19 1714    Sherwood Gambler, MD 04/10/19 [phone]

## 2019-04-07 NOTE — ED Notes (Signed)
Pt ambulatory to and from restroom with steady gait 

## 2019-04-07 NOTE — ED Notes (Signed)
Patient verbalizes understanding of discharge instructions. Opportunity for questioning and answers were provided. Pt discharged from ED. 

## 2019-04-07 NOTE — ED Provider Notes (Signed)
[facility] EMERGENCY DEPARTMENT Provider Note   CSN: [id number] Arrival date & time: 04/07/19  1235     History No chief complaint on file.   Carrie Thompson is a 29 y.o. female with a past medical history of obesity, anxiety, asthma, pseudotumor cerebri, and allergy to shellfish.  Patient presents emergency department after eating lunch.  Carrie Thompson normally gets "an allergy plate" but states that about 15 minutes after leaving from her lunch state Carrie Thompson began having itching and flushing over her face and chest, a globus sensation in her throat, a feeling of tightening and a tickle in her throat, and some coughing with associated shortness of breath.  Patient has an epinephrine autoinjector that Carrie Thompson [place], carries with her however Carrie Thompson states that Carrie Thompson was afraid to use it did know if it was time where Carrie Thompson should just come to the emergency room.  Carrie Thompson has had progressive worsening in her symptoms.  Carrie Thompson denies any swelling of her lips or tongue.  Carrie Thompson denies syncope, nausea, vomiting,, dizziness.  Carrie Thompson does feel somewhat tremulous.  HPI     Past Medical History:  Diagnosis Date  . Abnormal Pap smear   . Anxiety   . Asthma   . Chicken pox   . Depression   . Hx of varicella   . Seizures ([place])    hx pseudotumor treated in 2011 with meds, never had seizure  . Vaginal Pap smear, abnormal     Patient Active Problem List   Diagnosis Date Noted  . ADD (attention deficit disorder) 01/06/2016  . Childhood asthma 10/10/2014  . Anxiety and depression 10/10/2014  . Pseudotumor cerebri 10/10/2014    History reviewed. No pertinent surgical history.   OB History    Gravida  3   Para  3   Term  3   Preterm      AB      Living  3     SAB      TAB      Ectopic      Multiple  0   Live Births  3           Family History  Problem Relation Age of Onset  . Other Mother        varicose veins  . Varicose Veins Mother   . Mental illness Mother   . Diabetes  Father   . Mental illness Father   . Rheum arthritis Maternal Grandmother   . Hypertension Maternal Grandfather     Social History   Tobacco Use  . Smoking status: Never Smoker  . Smokeless tobacco: Never Used  Substance Use Topics  . Alcohol use: No    Alcohol/week: 0.0 standard drinks  . Drug use: No    Home Medications Prior to Admission medications   Medication Sig Start Date End Date Taking? Authorizing Provider  amphetamine-dextroamphetamine (ADDERALL) 30 MG tablet Take 1 tablet by mouth 2 (two) times daily. 03/03/17   [provider]  FLUoxetine (PROZAC) 40 MG capsule Take 40 mg by mouth daily.    [provider]  zolpidem (AMBIEN) 10 MG tablet Take 10 mg by mouth at bedtime as needed for sleep.    [provider]    Allergies    Shellfish allergy and Iodine  Review of Systems   Review of Systems Ten systems reviewed and are negative for acute change, except as noted in the HPI.  Physical Exam Updated Vital Signs BP (!) 141/93  Pulse 89   Temp 98.3 F (36.8 C) (Oral)   Resp 18   Ht 5\' 8"  (1.727 m)   Wt (!) 149.2 kg   SpO2 96%   BMI 50.02 kg/m   Physical Exam Vitals and nursing note reviewed.  Constitutional:      General: Carrie Thompson is not in acute distress.    Appearance: Carrie Thompson is well-developed. Carrie Thompson is not diaphoretic.  HENT:     Head: Normocephalic and atraumatic.     Mouth/Throat:     Pharynx: Uvula midline. No pharyngeal swelling.     Comments: Uvula midline without swelling. Mild phonation change with scratchy voice which is new, mild coughing and throat clearing Eyes:     General: No scleral icterus.    Conjunctiva/sclera: Conjunctivae normal.  Cardiovascular:     Rate and Rhythm: Normal rate and regular rhythm.     Heart sounds: Normal heart sounds. No murmur. No friction rub. No gallop.   Pulmonary:     Effort: Pulmonary effort is normal. No respiratory distress.     Breath sounds: Normal breath sounds. No wheezing.    Abdominal:     General: Bowel sounds are normal. There is no distension.     Palpations: Abdomen is soft. There is no mass.     Tenderness: There is no abdominal tenderness. There is no guarding.  Musculoskeletal:     Cervical back: Normal range of motion.  Skin:    General: Skin is warm and dry.     Findings: Rash present. Rash is urticarial.       Neurological:     Mental Status: Carrie Thompson is alert and oriented to person, place, and time.  Psychiatric:        Behavior: Behavior normal.     ED Results / Procedures / Treatments   Labs (all labs ordered are listed, but only abnormal results are displayed) Labs Reviewed  I-STAT BETA HCG BLOOD, ED ([facility], [facility], [facility] ONLY)    EKG None  Radiology No results found.  Procedures Procedures (including critical care time)  Medications Ordered in ED Medications  EPINEPHrine (EPI-PEN) injection 0.3 mg (0.3 mg Intramuscular Given 04/07/19 1253)  dexamethasone (DECADRON) injection 10 mg (10 mg Intravenous Given 04/07/19 1317)  famotidine (PEPCID) IVPB 20 mg premix (20 mg Intravenous New Bag/Given 04/07/19 1321)  diphenhydrAMINE (BENADRYL) injection 25 mg (25 mg Intravenous Given 04/07/19 1313)    ED Course  I have reviewed the triage vital signs and the nursing notes.  Pertinent labs & imaging results that were available during my care of the patient were reviewed by me and considered in my medical decision making (see chart for details).    MDM Rules/Calculators/A&P                      Patient here with anaphylactoid reaction after ingestion of unknown substance since likely shellfish.  Patient with acute hives, voice change, globus sensation, change in respiratory status.  Patient with immediate resolution of symptoms after steroids, epinephrine, Pepcid, and Benadryl.  Currently under observation to rule out any worsening in symptoms or bimodal reaction.  Patient beta hCG negative.  Expect discharge after 4 hours of labs.  Have given signout  to [facility] who will assume care of the patient. Final Clinical Impression(s) / ED Diagnoses Final diagnoses:  None    Rx / DC Orders ED Discharge Orders    None       Margarita Mail, PA-C 04/07/19 1610  Elnora Morrison, MD 04/10/19 1244

## 2019-04-07 NOTE — Discharge Instructions (Addendum)
You had an allergic reaction today.  Please continue to take prednisone and Pepcid over the next few days to help resolve inflammation and prevent recurrence.    You were prescribed an EpiPen, you should keep this with you at all times, and if you feel like you are having a similar anaphylactic reaction with facial swelling, difficulty breathing or swallowing, sore thorat or any other new or concerning symptoms you should use your EpiPen and then immediately present to the emergency department.

## 2019-05-09 ENCOUNTER — Other Ambulatory Visit: Payer: Self-pay

## 2019-05-09 ENCOUNTER — Ambulatory Visit (INDEPENDENT_AMBULATORY_CARE_PROVIDER_SITE_OTHER): Payer: BC Managed Care – PPO | Admitting: Internal Medicine

## 2019-05-09 ENCOUNTER — Encounter: Payer: Self-pay | Admitting: Internal Medicine

## 2019-05-09 VITALS — BP 126/84 | HR 85 | Temp 97.5°F | Wt 327.0 lb

## 2019-05-09 DIAGNOSIS — F5104 Psychophysiologic insomnia: Secondary | ICD-10-CM

## 2019-05-09 DIAGNOSIS — F329 Major depressive disorder, single episode, unspecified: Secondary | ICD-10-CM

## 2019-05-09 DIAGNOSIS — F32A Depression, unspecified: Secondary | ICD-10-CM

## 2019-05-09 DIAGNOSIS — F419 Anxiety disorder, unspecified: Secondary | ICD-10-CM | POA: Diagnosis not present

## 2019-05-09 DIAGNOSIS — R635 Abnormal weight gain: Secondary | ICD-10-CM | POA: Diagnosis not present

## 2019-05-09 DIAGNOSIS — F988 Other specified behavioral and emotional disorders with onset usually occurring in childhood and adolescence: Secondary | ICD-10-CM

## 2019-05-09 MED ORDER — FLUOXETINE HCL 40 MG PO CAPS: 40.0000 mg | ORAL_CAPSULE | Freq: Every day | ORAL | 1 refills | Status: DC

## 2019-05-09 MED ORDER — ZOLPIDEM TARTRATE 10 MG PO TABS: 10.0000 mg | ORAL_TABLET | Freq: Every evening | ORAL | 0 refills | Status: DC | PRN

## 2019-05-09 MED ORDER — AMPHETAMINE-DEXTROAMPHETAMINE 30 MG PO TABS: 1.0000 | ORAL_TABLET | Freq: Two times a day (BID) | ORAL | 0 refills | Status: DC

## 2019-05-09 NOTE — Patient Instructions (Signed)

## 2019-05-09 NOTE — Progress Notes (Signed)
Subjective:    Patient ID: Carrie Thompson, female    DOB: 07/04/1990, 29 y.o.   MRN: [id number]  HPI  Pt presents to the clinic today for 1 month follow up for weight check. She is considering bariatric surgery. She has done the free Webinar on the [facility] website. Her starting weight was 332 lbs with a BMI of 51.32. Her weight today is with a BMI of 327 lbs with a BMI of 49.72. She has her consultation with the surgeon on Thursday.  Breakfast: Protein shake Lunch: Depends on the day, sometimes fast food Dinner: Protein and vegetable Snacks: None Exercise: Burn boot camp 3-4 day per week  She would like to know if I would take over filling Adderall, Prozac, Rexulti and Ambien. She is no longer seeing her psychiatrist at the [facility].  Review of Systems      Past Medical History:  Diagnosis Date  . Abnormal Pap smear   . Anxiety   . Asthma   . Chicken pox   . Depression   . Hx of varicella   . Seizures ([place])    hx pseudotumor treated in 2011 with meds, never had seizure  . Vaginal Pap smear, abnormal     Current Outpatient Medications  Medication Sig Dispense Refill  . amphetamine-dextroamphetamine (ADDERALL) 30 MG tablet Take 1 tablet by mouth 2 (two) times daily.  0  . famotidine (PEPCID) 20 MG tablet Take 1 tablet (20 mg total) by mouth 2 (two) times daily. 10 tablet 0  . FLUoxetine (PROZAC) 40 MG capsule Take 40 mg by mouth daily.    Marland Kitchen zolpidem (AMBIEN) 10 MG tablet Take 10 mg by mouth at bedtime as needed for sleep.     No current facility-administered medications for this visit.    Allergies  Allergen Reactions  . Shellfish Allergy Anaphylaxis  . Iodine Other (See Comments)    Pt states that she avoids iodine because of her reaction to shellfish.      Family History  Problem Relation Age of Onset  . Other Mother        varicose veins  . Varicose Veins Mother   . Mental illness Mother   . Diabetes Father   . Mental illness Father   . Rheum arthritis  Maternal Grandmother   . Hypertension Maternal Grandfather     Social History   Socioeconomic History  . Marital status: Married    Spouse name: Not on file  . Number of children: Not on file  . Years of education: Not on file  . Highest education level: Not on file  Occupational History  . Not on file  Tobacco Use  . Smoking status: Never Smoker  . Smokeless tobacco: Never Used  Substance and Sexual Activity  . Alcohol use: No    Alcohol/week: 0.0 standard drinks  . Drug use: No  . Sexual activity: Yes    Birth control/protection: I.U.D.  Other Topics Concern  . Not on file  Social History Narrative  . Not on file   Social Determinants of Health   Financial Resource Strain:   . Difficulty of Paying Living Expenses: Not on file  Food Insecurity:   . Worried About Charity fundraiser in the Last Year: Not on file  . Ran Out of Food in the Last Year: Not on file  Transportation Needs:   . Lack of Transportation (Medical): Not on file  . Lack of Transportation (Non-Medical): Not on file  Physical  Activity:   . Days of Exercise per Week: Not on file  . Minutes of Exercise per Session: Not on file  Stress:   . Feeling of Stress : Not on file  Social Connections:   . Frequency of Communication with Friends and Family: Not on file  . Frequency of Social Gatherings with Friends and Family: Not on file  . Attends Religious Services: Not on file  . Active Member of Clubs or Organizations: Not on file  . Attends Archivist Meetings: Not on file  . Marital Status: Not on file  Intimate Partner Violence:   . Fear of Current or Ex-Partner: Not on file  . Emotionally Abused: Not on file  . Physically Abused: Not on file  . Sexually Abused: Not on file     Constitutional: Denies fever, malaise, fatigue, headache or abrupt weight changes.  Respiratory: Denies difficulty breathing, shortness of breath, cough or sputum production.   Cardiovascular: Denies chest  pain, chest tightness, palpitations or swelling in the hands or feet.  Gastrointestinal: Denies abdominal pain, bloating, constipation, diarrhea or blood in the stool.  Neurological: Pt reports insomnia. Denies dizziness, difficulty with memory, difficulty with speech or problems with balance and coordination.  Psych: Pt has a history of anxiety and depression. Denies SI/HI.  No other specific complaints in a complete review of systems (except as listed in HPI above).  Objective:   Physical Exam   BP 126/84   Pulse 85   Temp (!) 97.5 F (36.4 C) (Temporal)   Wt (!) 327 lb (148.3 kg)   SpO2 98%   BMI 49.72 kg/m   Wt Readings from Last 3 Encounters:  04/07/19 (!) 329 lb (149.2 kg)  03/28/19 (!) 332 lb (150.6 kg)  10/14/18 (!) 321 lb (145.6 kg)    General: Appears herstated age, obese, in NAD. Cardiovascular: Normal rate and rhythm.  Pulmonary/Chest: Normal effort and positive vesicular breath sounds. No respiratory distress. No wheezes, rales or ronchi noted.  Neurological: Alert and oriented.  Psychiatric: Mood and affect normal. Behavior is normal. Judgment and thought content normal.     BMET    Component Value Date/Time   NA 138 10/14/2018 1459   K 4.3 10/14/2018 1459   CL 103 10/14/2018 1459   CO2 26 10/14/2018 1459   GLUCOSE 106 (H) 10/14/2018 1459   BUN 12 10/14/2018 1459   CREATININE 0.86 10/14/2018 1459   CALCIUM 9.0 10/14/2018 1459   GFRNONAA >90 05/31/2014 2050   GFRAA >90 05/31/2014 2050    Lipid Panel     Component Value Date/Time   CHOL 119 06/03/2016 1455   TRIG 58.0 06/03/2016 1455   HDL 30.30 (L) 06/03/2016 1455   CHOLHDL 4 06/03/2016 1455   VLDL 11.6 06/03/2016 1455   LDLCALC 77 06/03/2016 1455    CBC    Component Value Date/Time   WBC 9.9 10/14/2018 1459   RBC 4.40 10/14/2018 1459   HGB 14.0 10/14/2018 1459   HCT 40.0 10/14/2018 1459   PLT 315 10/14/2018 1459   MCV 90.9 10/14/2018 1459   MCH 31.8 10/14/2018 1459   MCHC 35.0  10/14/2018 1459   RDW 12.1 10/14/2018 1459    Hgb A1C Lab Results  Component Value Date   HGBA1C 5.5 10/14/2018           Assessment & Plan:   Abnormal Weight Gain:  Encouraged her to eat 6 small meals instead of 3 big meals Continue burn boot camp 4 days per  week Keep consultation appt with bariatric surgeon  Anxiety, Depression, Insomnia, ADD:  Will refill Prozac, Amvbien and Adderall  Return precautions discussed Webb Silversmith, NP This visit occurred during the SARS-CoV-2 public health emergency.  Safety protocols were in place, including screening questions prior to the visit, additional usage of staff PPE, and extensive cleaning of exam room while observing appropriate contact time as indicated for disinfecting solutions.

## 2019-05-11 ENCOUNTER — Telehealth: Payer: BC Managed Care – PPO | Admitting: Physician Assistant

## 2019-05-11 DIAGNOSIS — M546 Pain in thoracic spine: Secondary | ICD-10-CM | POA: Diagnosis not present

## 2019-05-11 MED ORDER — CYCLOBENZAPRINE HCL 10 MG PO TABS: 10.0000 mg | ORAL_TABLET | Freq: Three times a day (TID) | ORAL | 0 refills | Status: DC | PRN

## 2019-05-11 MED ORDER — NAPROXEN 500 MG PO TABS: 500.0000 mg | ORAL_TABLET | Freq: Two times a day (BID) | ORAL | 0 refills | Status: DC

## 2019-05-11 NOTE — Progress Notes (Signed)

## 2019-05-16 ENCOUNTER — Encounter: Payer: Self-pay | Admitting: Internal Medicine

## 2019-05-16 MED ORDER — FLUOXETINE HCL 20 MG PO CAPS: 20.0000 mg | ORAL_CAPSULE | Freq: Every day | ORAL | 1 refills | Status: DC

## 2019-05-16 MED ORDER — AMPHETAMINE-DEXTROAMPHETAMINE 30 MG PO TABS: 1.0000 | ORAL_TABLET | Freq: Two times a day (BID) | ORAL | 0 refills | Status: DC

## 2019-05-24 DIAGNOSIS — R87619 Unspecified abnormal cytological findings in specimens from cervix uteri: Secondary | ICD-10-CM | POA: Insufficient documentation

## 2019-05-24 DIAGNOSIS — Z3202 Encounter for pregnancy test, result negative: Secondary | ICD-10-CM | POA: Diagnosis not present

## 2019-05-24 DIAGNOSIS — Z6841 Body Mass Index (BMI) 40.0 and over, adult: Secondary | ICD-10-CM | POA: Diagnosis not present

## 2019-05-24 DIAGNOSIS — R1032 Left lower quadrant pain: Secondary | ICD-10-CM | POA: Diagnosis not present

## 2019-05-24 DIAGNOSIS — Z01419 Encounter for gynecological examination (general) (routine) without abnormal findings: Secondary | ICD-10-CM | POA: Diagnosis not present

## 2019-05-24 LAB — HM PAP SMEAR

## 2019-06-07 DIAGNOSIS — F988 Other specified behavioral and emotional disorders with onset usually occurring in childhood and adolescence: Secondary | ICD-10-CM | POA: Diagnosis not present

## 2019-06-07 DIAGNOSIS — F329 Major depressive disorder, single episode, unspecified: Secondary | ICD-10-CM | POA: Diagnosis not present

## 2019-06-07 DIAGNOSIS — F419 Anxiety disorder, unspecified: Secondary | ICD-10-CM | POA: Diagnosis not present

## 2019-06-12 ENCOUNTER — Other Ambulatory Visit: Payer: Self-pay | Admitting: Surgery

## 2019-06-12 ENCOUNTER — Other Ambulatory Visit (HOSPITAL_COMMUNITY): Payer: Self-pay | Admitting: Surgery

## 2019-06-14 ENCOUNTER — Other Ambulatory Visit: Payer: Self-pay | Admitting: Internal Medicine

## 2019-06-14 MED ORDER — ZOLPIDEM TARTRATE 10 MG PO TABS: 10.0000 mg | ORAL_TABLET | Freq: Every evening | ORAL | 0 refills | Status: DC | PRN

## 2019-06-14 MED ORDER — AMPHETAMINE-DEXTROAMPHETAMINE 30 MG PO TABS: 1.0000 | ORAL_TABLET | Freq: Two times a day (BID) | ORAL | 0 refills | Status: DC

## 2019-06-14 NOTE — Telephone Encounter (Signed)
Adderall last filled 05/23/2019... note to pharmacy to be filled on or after 06/22/2019... Ambien last filled 05/09/2019...Marland Kitchen please advise

## 2019-06-20 ENCOUNTER — Encounter: Payer: BC Managed Care – PPO | Attending: Surgery | Admitting: Dietician

## 2019-06-20 ENCOUNTER — Other Ambulatory Visit: Payer: Self-pay

## 2019-06-20 DIAGNOSIS — E669 Obesity, unspecified: Secondary | ICD-10-CM | POA: Diagnosis not present

## 2019-06-20 NOTE — Progress Notes (Signed)
Nutrition Assessment for Bariatric Surgery Medical Nutrition Therapy   Patient was seen on 06/20/2019 for Pre-Operative Nutrition Assessment. Letter of approval faxed to [facility] bariatric surgery program coordinator on 06/20/2019.   Referral stated Supervised Weight Loss (SWL) visits needed: 0   Planned surgery: Sleeve Gastrectomy  Pt expectation of surgery: to be healthier, feel better, have more energy; would like to weigh ~190 to 200 lbs Pt expectation of dietitian: to provide guidance through diet phases after surgery    NUTRITION ASSESSMENT   Anthropometrics  Start weight at [facility]: 329.5 lbs (date: 06/20/2019) Height: 68 in BMI: 50.1 kg/m2     Lifestyle & Dietary Hx Patient lives with her husband and 2 children. Mom had RYGB ~15 years ago and has been successful and supportive of pt. Works part time.  States she eats less on the weekends when at home, only keeps snacks in the house for kids. Typically does not snack, meal pattern is 2-3 meals per day. Usually only has iced coffee for breakfast. Avoids burgers, usually will have chicken. Pt is knowledegable about nutrition and overall eats well, but thinks portion control is an issue. Does not have a large variety of vegetables, mainly due to textures, but will eat them. Drinks at least 64 ounces of fluid per day.   24-Hr Dietary Recall First Meal: iced coffee (coffee + skim milk + sugar-free creamer)  Snack: - Second Meal: burrito bowl  Snack: - Third Meal: whole wheat lasagna dish (or tacos)   Snack: - Beverages: water, zero calorie flavored water, diet Dr. Malachi Bonds    NUTRITION DIAGNOSIS  Overweight/obesity ([place]-3.3) related to past poor dietary habits and physical inactivity as evidenced by patient w/ planned Sleeve Gastrectomy surgery following dietary guidelines for continued weight loss.    NUTRITION INTERVENTION  Nutrition counseling (C-1) and education (E-2) to facilitate bariatric surgery goals.  Pre-Op  Goals Reviewed with the Patient . Track food and beverage intake (pen and paper, MyFitness Pal, Baritastic app, etc.) . Make healthy food choices while monitoring portion sizes . Consume 3 meals per day or try to eat every 3-5 hours . Avoid concentrated sugars and fried foods . Keep sugar & fat in the single digits per serving on food labels . Practice CHEWING your food (aim for applesauce consistency) . Practice not drinking 15 minutes before, during, and 30 minutes after each meal and snack . Avoid all carbonated beverages (ex: soda, sparkling beverages)  . Limit caffeinated beverages (ex: coffee, tea, energy drinks) . Avoid all sugar-sweetened beverages (ex: regular soda, sports drinks)  . Avoid alcohol  . Aim for 64-100 ounces of FLUID daily (with at least half of fluid intake being plain water)  . Aim for at least 60-80 grams of PROTEIN daily . Look for a liquid protein source that contains ?15 g protein and ?5 g carbohydrate (ex: shakes, drinks, shots) . Make a list of non-food related activities . Physical activity is an important part of a healthy lifestyle so keep it moving! The goal is to reach 150 minutes of exercise per week, including cardiovascular and weight baring activity.   *Goals that are bolded indicate the pt would like to start working towards these  Handouts Provided Include  . Bariatric Surgery handouts (Nutrition Visits, Pre-Op Goals, Protein Shakes, Vitamins & Minerals)  Learning Style & Readiness for Change Teaching method utilized: Visual & Auditory  Demonstrated degree of understanding via: Teach Back  Barriers to learning/adherence to lifestyle change: None Identified   MONITORING &  EVALUATION Dietary intake, weekly physical activity, body weight, and pre-op goals reached at next nutrition visit.   Next Steps Patient is to follow up at [facility] for Pre-Op Class (>2 weeks before surgery) for further nutrition education.

## 2019-06-20 NOTE — Patient Instructions (Addendum)
Begin working through the [employer] discussed today, starting with:  . Practice not drinking 15 minutes before, during, and 30 minutes after each meal and snack . Avoid all carbonated beverages (ex: soda, sparkling beverages)  . Have a meal/snack at least 3 times per day

## 2019-06-27 ENCOUNTER — Other Ambulatory Visit: Payer: Self-pay

## 2019-06-27 ENCOUNTER — Ambulatory Visit (HOSPITAL_COMMUNITY)
Admission: RE | Admit: 2019-06-27 | Discharge: 2019-06-27 | Disposition: A | Discharge status: Home / Self Care | Payer: BC Managed Care – PPO | Source: Ambulatory Visit | Attending: Surgery | Admitting: Surgery

## 2019-06-27 DIAGNOSIS — J9811 Atelectasis: Secondary | ICD-10-CM | POA: Diagnosis not present

## 2019-06-27 DIAGNOSIS — Z01818 Encounter for other preprocedural examination: Secondary | ICD-10-CM | POA: Diagnosis not present

## 2019-07-03 ENCOUNTER — Other Ambulatory Visit: Payer: Self-pay

## 2019-07-03 ENCOUNTER — Encounter: Payer: BC Managed Care – PPO | Attending: Surgery | Admitting: Skilled Nursing Facility1

## 2019-07-03 DIAGNOSIS — E669 Obesity, unspecified: Secondary | ICD-10-CM | POA: Diagnosis not present

## 2019-07-03 NOTE — Progress Notes (Signed)
Pre-Operative Nutrition Class:  Appt start time: 7184   End time:  1830.  Patient was seen on 07/03/2019 for Pre-Operative Bariatric Surgery [facility]    Surgery date:  Surgery type: sleeve Start weight at [facility]: 329.5 Weight today: 332.2  Samples given per MNT protocol. Patient educated on appropriate usage: Bariatric Advantage Multivitamin Lot #[id number] Exp:08/21  Bariatric Advantage Calcium  Lot #[id number] Exp:03/25/2020  Protein Shake Lot #[id number] Exp:08/07/2020  The following the learning objectives were met by the patient during this course:  Identify Pre-Op Dietary Goals and will begin 2 weeks pre-operatively  Identify appropriate sources of fluids and proteins   State protein recommendations and appropriate sources pre and post-operatively  Identify Post-Operative Dietary Goals and will follow for 2 weeks post-operatively  Identify appropriate multivitamin and calcium sources  Describe the need for physical activity post-operatively and will follow MD recommendations  State when to call healthcare provider regarding medication questions or post-operative complications  Handouts given during class include:  Pre-Op Bariatric Surgery Diet Handout  Protein Shake Handout  Post-Op Bariatric Surgery Nutrition Handout  [facility] Program Information Flyer  Support Group Information Flyer  [facility] Bariatric Supplements Price List  Follow-Up Plan: Patient will follow-up at [facility] 2 weeks post operatively for diet advancement per MD.

## 2019-07-06 ENCOUNTER — Ambulatory Visit: Payer: BC Managed Care – PPO | Admitting: Dietician

## 2019-07-12 ENCOUNTER — Ambulatory Visit: Payer: BC Managed Care – PPO | Admitting: Dietician

## 2019-07-18 ENCOUNTER — Other Ambulatory Visit: Payer: Self-pay | Admitting: Internal Medicine

## 2019-07-18 ENCOUNTER — Encounter: Payer: Self-pay | Admitting: Internal Medicine

## 2019-07-18 MED ORDER — AMPHETAMINE-DEXTROAMPHETAMINE 30 MG PO TABS: 1.0000 | ORAL_TABLET | Freq: Two times a day (BID) | ORAL | 0 refills | Status: DC

## 2019-07-18 MED ORDER — ZOLPIDEM TARTRATE 10 MG PO TABS: 10.0000 mg | ORAL_TABLET | Freq: Every evening | ORAL | 0 refills | Status: DC | PRN

## 2019-07-18 NOTE — Telephone Encounter (Signed)
This is a duplicate request

## 2019-07-18 NOTE — Telephone Encounter (Signed)
Both last filled 06/14/2019... please advise

## 2019-07-21 ENCOUNTER — Ambulatory Visit: Payer: Self-pay | Admitting: Surgery

## 2019-07-21 NOTE — H&P (View-Only) (Signed)
Surgical Evaluation  Chief Complaint: morbid obesity  HPI: returns today to continue evaluation of surgical management of severe obesity.  She reports no changes in her health since our last meeting. She has had her preoperative visit with the dietitians and has her protein and supplements ready to go.  She has several insightful questions to discuss today. She has completed the preoperative pathway.  On her lab work her folate was borderline and she was notified to begin over-the-counter supplement.  Otherwise unremarkable.chest x-ray showed low lung volumes with mild bibasilar subsegmental atelectasis, her upper GI was normal. She has that with the dietitian Gaylan Gerold) as well as psychology Carloyn Manner) and is approved from that standpoint.   Initial Visit 06/07/19:  This is a very pleasant 29 year old woman who presents to discuss surgical management of severe obesity.  She has been struggling with this for her entire life.  She has tried numerous methods of weight loss with some success, she has been able to lose 70 or 80 pounds at times, but ultimately regained the weight plus more.  Over the last year and a half she has gained significant amount of weight due to multifactorial issues including the pandemic, struggles with titrating her psychiatric medications and depression.  She endorses well-balanced diet for the most part, but does have some difficulty with late night eating or uncontrolled bleeding.  She does not struggle with emotional eating necessarily.  She is very active and goes to a very vigorous boot-camp type workout in [place]. She wishes to proceed with bariatric surgery in order to stave off developing complications of obesity and to improve her overall health. Her mother had a Roux-en-Y gastric bypass and she has marveled about how well her mother is doing over the years, although notes that she did have significant issues with nausea and vomiting as well as ulcers. Ms. Milford  is a mother of 2, and has no plans to have more children.  Nonsmoker.  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 She is a history of depression, anxiety, ADD and reports that she had significant struggles managing this over the last year and a half but now is on a good regimen and feels that these diseases are well managed. 329.25lb/ BMI 50.06 She is a good candidate for bariatric surgery. She is interested in the Roux-en-Y gastric bypass based on her anecdotal experience with her mother and her wish to have the most restrictive option possible. I do recommend that she consider the sleeve gastrectomy given her young age and lack of comorbidities. We discussed the pros and cons of each surgery in terms of long-term issues, noting absorptive issues with the Roux-en-Y mass effect the need to titrate her psychiatric medications, the need for lifelong vitamin dependency, and the risks of marginal ulcer or internal hernia or chronic abdominal pain etc. that are unique to the gastric bypass. We discussed the surgery including technical aspects, the risks of bleeding, infection, pain, scarring, injury to intra-abdominal structures, staple line leak or abscess, chronic abdominal pain or nausea, new onset or worsened GERD, DVT/PE, pneumonia, heart attack, stroke, death, failure to reach weight loss goals and weight regain, hernia. Discussed the typical pre-, peri-, and postoperative course. Discussed the importance of lifelong behavioral changes to combat the chronic and relapsing disease which is obesity. Questions were welcomed and answered. We will initiate bariatric pathway and she will further consider the sleeve.  Allergies  Allergen Reactions   Shellfish Allergy Anaphylaxis  Iodine Other (See Comments)    Pt states that she avoids iodine because of her reaction to shellfish.      Past Medical History:  Diagnosis Date   Abnormal Pap smear    Anxiety    Asthma    Chicken  pox    Depression    Hx of varicella    Seizures (HCC)    hx pseudotumor treated in 2011 with meds, never had seizure   Vaginal Pap smear, abnormal     No past surgical history on file.  Family History  Problem Relation Age of Onset   Other Mother        varicose veins   Varicose Veins Mother    Mental illness Mother    Diabetes Father    Mental illness Father    Rheum arthritis Maternal Grandmother    Hypertension Maternal Grandfather     Social History   Socioeconomic History   Marital status: Married    Spouse name: Not on file   Number of children: Not on file   Years of education: Not on file   Highest education level: Not on file  Occupational History   Not on file  Tobacco Use   Smoking status: Never Smoker   Smokeless tobacco: Never Used  Substance and Sexual Activity   Alcohol use: No    Alcohol/week: 0.0 standard drinks   Drug use: No   Sexual activity: Yes    Birth control/protection: I.U.D.  Other Topics Concern   Not on file  Social History Narrative   Not on file   Social Determinants of Health   Financial Resource Strain:    Difficulty of Paying Living Expenses:   Food Insecurity:    Worried About Charity fundraiser in the Last Year:    Arboriculturist in the Last Year:   Transportation Needs:    Film/video editor (Medical):    Lack of Transportation (Non-Medical):   Physical Activity:    Days of Exercise per Week:    Minutes of Exercise per Session:   Stress:    Feeling of Stress :   Social Connections:    Frequency of Communication with Friends and Family:    Frequency of Social Gatherings with Friends and Family:    Attends Religious Services:    Active Member of Clubs or Organizations:    Attends Music therapist:    Marital Status:     Current Outpatient Medications on File Prior to Visit  Medication Sig Dispense Refill   amphetamine-dextroamphetamine (ADDERALL) 30 MG tablet Take 1 tablet by mouth 2 (two)  times daily. 60 tablet 0   brexpiprazole (REXULTI) 2 MG TABS tablet Take by mouth daily.     cyclobenzaprine (FLEXERIL) 10 MG tablet Take 1 tablet (10 mg total) by mouth 3 (three) times daily as needed for muscle spasms. 30 tablet 0   famotidine (PEPCID) 20 MG tablet Take 1 tablet (20 mg total) by mouth 2 (two) times daily. 10 tablet 0   FLUoxetine (PROZAC) 20 MG capsule Take 1 capsule (20 mg total) by mouth daily. 90 capsule 1   FLUoxetine (PROZAC) 40 MG capsule Take 1 capsule (40 mg total) by mouth daily. 90 capsule 1   naproxen (NAPROSYN) 500 MG tablet Take 1 tablet (500 mg total) by mouth 2 (two) times daily with a meal. 20 tablet 0   zolpidem (AMBIEN) 10 MG tablet Take 1 tablet (10 mg total) by mouth at bedtime as  needed for sleep. 30 tablet 0   No current facility-administered medications on file prior to visit.    Review of Systems: a complete, 10pt review of systems was completed with pertinent positives and negatives as documented in the HPI  Physical Exam:  Vitals  Weight: 333.38 lb   Height: 68 in  Body Surface Area: 2.54 m   Body Mass Index: 50.69 kg/m   Temp.: 97.8 F    Pulse: 110 (Regular)    P.OX: 97% (Room air) BP: 130/92(Sitting, Left Arm, Standard)   Alert and well-appearing   CBC Latest Ref Rng & Units 10/14/2018 06/03/2016 02/06/2015  WBC 3.8 - 10.8 Thousand/uL 9.9 7.0 4.3  Hemoglobin 11.7 - 15.5 g/dL 14.0 15.8(H) 13.2  Hematocrit 35.0 - 45.0 % 40.0 47.0(H) 39.9  Platelets 140 - 400 Thousand/uL 315 275.0 217.0    CMP Latest Ref Rng & Units 10/14/2018 06/03/2016 02/06/2015  Glucose 65 - 99 mg/dL 106(H) 96 88  BUN 7 - 25 mg/dL 12 14 12   Creatinine 0.50 - 1.10 mg/dL 0.86 0.81 0.75  Sodium 135 - 146 mmol/L 138 136 139  Potassium 3.5 - 5.3 mmol/L 4.3 4.6 3.9  Chloride 98 - 110 mmol/L 103 108 102  CO2 20 - 32 mmol/L 26 23 31   Calcium 8.6 - 10.2 mg/dL 9.0 9.3 8.7  Total Protein 6.1 - 8.1 g/dL 6.4 7.9 7.0  Total Bilirubin 0.2 - 1.2 mg/dL 0.4 0.5 0.5  Alkaline  Phos 39 - 117 U/L - 73 102  AST 10 - 30 U/L 19 39(H) 20  ALT 6 - 29 U/L 21 86(H) 18    No results found for: INR, PROTIME  Imaging: No results found.   A/P:  MORBID OBESITY (E66.01) Story: She remains an excellent candidate for bariatric surgery and I have recommended the sleeve gastrectomy. We have previously discussed the surgery including technical aspects, the risks of bleeding, infection, pain, scarring, injury to intra-abdominal structures, staple line leak or abscess, chronic abdominal pain or nausea, new onset or worsened GERD, DVT/PE, pneumonia, heart attack, stroke, death, failure to reach weight loss goals and weight regain, hernia. Discussed the typical peri-, and postoperative course. Discussed the importance of lifelong behavioral changes to combat the chronic and relapsing disease which is obesity. Questions were welcomed and answered. We will proceed as scheduled next week. DEPRESSION (F32.9),  ANXIETY (F41.9),  ADD (ATTENTION DEFICIT DISORDER) (F98.8)      Patient Active Problem List   Diagnosis Date Noted   Obesity 06/20/2019   ADD (attention deficit disorder) 01/06/2016   Childhood asthma 10/10/2014   Anxiety and depression 10/10/2014   Pseudotumor cerebri 10/10/2014       Romana Juniper, MD [facility]  See AMION to contact appropriate on-call provider

## 2019-07-21 NOTE — H&P (Signed)
Surgical Evaluation  Chief Complaint: morbid obesity  HPI: returns today to continue evaluation of surgical management of severe obesity.  She reports no changes in her health since our last meeting. She has had her preoperative visit with the dietitians and has her protein and supplements ready to go.  She has several insightful questions to discuss today. She has completed the preoperative pathway.  On her lab work her folate was borderline and she was notified to begin over-the-counter supplement.  Otherwise unremarkable.chest x-ray showed low lung volumes with mild bibasilar subsegmental atelectasis, her upper GI was normal. She has that with the dietitian Gaylan Gerold) as well as psychology Carloyn Manner) and is approved from that standpoint.   Initial Visit 06/07/19:  This is a very pleasant 29 year old woman who presents to discuss surgical management of severe obesity.  She has been struggling with this for her entire life.  She has tried numerous methods of weight loss with some success, she has been able to lose 70 or 80 pounds at times, but ultimately regained the weight plus more.  Over the last year and a half she has gained significant amount of weight due to multifactorial issues including the pandemic, struggles with titrating her psychiatric medications and depression.  She endorses well-balanced diet for the most part, but does have some difficulty with late night eating or uncontrolled bleeding.  She does not struggle with emotional eating necessarily.  She is very active and goes to a very vigorous boot-camp type workout in [place]. She wishes to proceed with bariatric surgery in order to stave off developing complications of obesity and to improve her overall health. Her mother had a Roux-en-Y gastric bypass and she has marveled about how well her mother is doing over the years, although notes that she did have significant issues with nausea and vomiting as well as ulcers. Carrie Thompson  is a mother of 2, and has no plans to have more children.  Nonsmoker.  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 She is a history of depression, anxiety, ADD and reports that she had significant struggles managing this over the last year and a half but now is on a good regimen and feels that these diseases are well managed. 329.25lb/ BMI 50.06 She is a good candidate for bariatric surgery. She is interested in the Roux-en-Y gastric bypass based on her anecdotal experience with her mother and her wish to have the most restrictive option possible. I do recommend that she consider the sleeve gastrectomy given her young age and lack of comorbidities. We discussed the pros and cons of each surgery in terms of long-term issues, noting absorptive issues with the Roux-en-Y mass effect the need to titrate her psychiatric medications, the need for lifelong vitamin dependency, and the risks of marginal ulcer or internal hernia or chronic abdominal pain etc. that are unique to the gastric bypass. We discussed the surgery including technical aspects, the risks of bleeding, infection, pain, scarring, injury to intra-abdominal structures, staple line leak or abscess, chronic abdominal pain or nausea, new onset or worsened GERD, DVT/PE, pneumonia, heart attack, stroke, death, failure to reach weight loss goals and weight regain, hernia. Discussed the typical pre-, peri-, and postoperative course. Discussed the importance of lifelong behavioral changes to combat the chronic and relapsing disease which is obesity. Questions were welcomed and answered. We will initiate bariatric pathway and she will further consider the sleeve.  Allergies  Allergen Reactions   Shellfish Allergy Anaphylaxis  Iodine Other (See Comments)    Pt states that she avoids iodine because of her reaction to shellfish.      Past Medical History:  Diagnosis Date   Abnormal Pap smear    Anxiety    Asthma    Chicken  pox    Depression    Hx of varicella    Seizures (HCC)    hx pseudotumor treated in 2011 with meds, never had seizure   Vaginal Pap smear, abnormal     No past surgical history on file.  Family History  Problem Relation Age of Onset   Other Mother        varicose veins   Varicose Veins Mother    Mental illness Mother    Diabetes Father    Mental illness Father    Rheum arthritis Maternal Grandmother    Hypertension Maternal Grandfather     Social History   Socioeconomic History   Marital status: Married    Spouse name: Not on file   Number of children: Not on file   Years of education: Not on file   Highest education level: Not on file  Occupational History   Not on file  Tobacco Use   Smoking status: Never Smoker   Smokeless tobacco: Never Used  Substance and Sexual Activity   Alcohol use: No    Alcohol/week: 0.0 standard drinks   Drug use: No   Sexual activity: Yes    Birth control/protection: I.U.D.  Other Topics Concern   Not on file  Social History Narrative   Not on file   Social Determinants of Health   Financial Resource Strain:    Difficulty of Paying Living Expenses:   Food Insecurity:    Worried About Charity fundraiser in the Last Year:    Arboriculturist in the Last Year:   Transportation Needs:    Film/video editor (Medical):    Lack of Transportation (Non-Medical):   Physical Activity:    Days of Exercise per Week:    Minutes of Exercise per Session:   Stress:    Feeling of Stress :   Social Connections:    Frequency of Communication with Friends and Family:    Frequency of Social Gatherings with Friends and Family:    Attends Religious Services:    Active Member of Clubs or Organizations:    Attends Music therapist:    Marital Status:     Current Outpatient Medications on File Prior to Visit  Medication Sig Dispense Refill   amphetamine-dextroamphetamine (ADDERALL) 30 MG tablet Take 1 tablet by mouth 2 (two)  times daily. 60 tablet 0   brexpiprazole (REXULTI) 2 MG TABS tablet Take by mouth daily.     cyclobenzaprine (FLEXERIL) 10 MG tablet Take 1 tablet (10 mg total) by mouth 3 (three) times daily as needed for muscle spasms. 30 tablet 0   famotidine (PEPCID) 20 MG tablet Take 1 tablet (20 mg total) by mouth 2 (two) times daily. 10 tablet 0   FLUoxetine (PROZAC) 20 MG capsule Take 1 capsule (20 mg total) by mouth daily. 90 capsule 1   FLUoxetine (PROZAC) 40 MG capsule Take 1 capsule (40 mg total) by mouth daily. 90 capsule 1   naproxen (NAPROSYN) 500 MG tablet Take 1 tablet (500 mg total) by mouth 2 (two) times daily with a meal. 20 tablet 0   zolpidem (AMBIEN) 10 MG tablet Take 1 tablet (10 mg total) by mouth at bedtime as  needed for sleep. 30 tablet 0   No current facility-administered medications on file prior to visit.    Review of Systems: a complete, 10pt review of systems was completed with pertinent positives and negatives as documented in the HPI  Physical Exam:  Vitals  Weight: 333.38 lb   Height: 68 in  Body Surface Area: 2.54 m   Body Mass Index: 50.69 kg/m   Temp.: 97.8 F    Pulse: 110 (Regular)    P.OX: 97% (Room air) BP: 130/92(Sitting, Left Arm, Standard)   Alert and well-appearing   CBC Latest Ref Rng & Units 10/14/2018 06/03/2016 02/06/2015  WBC 3.8 - 10.8 Thousand/uL 9.9 7.0 4.3  Hemoglobin 11.7 - 15.5 g/dL 14.0 15.8(H) 13.2  Hematocrit 35.0 - 45.0 % 40.0 47.0(H) 39.9  Platelets 140 - 400 Thousand/uL 315 275.0 217.0    CMP Latest Ref Rng & Units 10/14/2018 06/03/2016 02/06/2015  Glucose 65 - 99 mg/dL 106(H) 96 88  BUN 7 - 25 mg/dL 12 14 12   Creatinine 0.50 - 1.10 mg/dL 0.86 0.81 0.75  Sodium 135 - 146 mmol/L 138 136 139  Potassium 3.5 - 5.3 mmol/L 4.3 4.6 3.9  Chloride 98 - 110 mmol/L 103 108 102  CO2 20 - 32 mmol/L 26 23 31   Calcium 8.6 - 10.2 mg/dL 9.0 9.3 8.7  Total Protein 6.1 - 8.1 g/dL 6.4 7.9 7.0  Total Bilirubin 0.2 - 1.2 mg/dL 0.4 0.5 0.5  Alkaline  Phos 39 - 117 U/L - 73 102  AST 10 - 30 U/L 19 39(H) 20  ALT 6 - 29 U/L 21 86(H) 18    No results found for: INR, PROTIME  Imaging: No results found.   A/P:  MORBID OBESITY (E66.01) Story: She remains an excellent candidate for bariatric surgery and I have recommended the sleeve gastrectomy. We have previously discussed the surgery including technical aspects, the risks of bleeding, infection, pain, scarring, injury to intra-abdominal structures, staple line leak or abscess, chronic abdominal pain or nausea, new onset or worsened GERD, DVT/PE, pneumonia, heart attack, stroke, death, failure to reach weight loss goals and weight regain, hernia. Discussed the typical peri-, and postoperative course. Discussed the importance of lifelong behavioral changes to combat the chronic and relapsing disease which is obesity. Questions were welcomed and answered. We will proceed as scheduled next week. DEPRESSION (F32.9),  ANXIETY (F41.9),  ADD (ATTENTION DEFICIT DISORDER) (F98.8)      Patient Active Problem List   Diagnosis Date Noted   Obesity 06/20/2019   ADD (attention deficit disorder) 01/06/2016   Childhood asthma 10/10/2014   Anxiety and depression 10/10/2014   Pseudotumor cerebri 10/10/2014       Romana Juniper, MD [facility]  See AMION to contact appropriate on-call provider

## 2019-07-24 DIAGNOSIS — Z113 Encounter for screening for infections with a predominantly sexual mode of transmission: Secondary | ICD-10-CM | POA: Diagnosis not present

## 2019-07-24 DIAGNOSIS — Z30433 Encounter for removal and reinsertion of intrauterine contraceptive device: Secondary | ICD-10-CM | POA: Diagnosis not present

## 2019-07-24 DIAGNOSIS — Z3202 Encounter for pregnancy test, result negative: Secondary | ICD-10-CM | POA: Diagnosis not present

## 2019-07-28 ENCOUNTER — Encounter: Payer: Self-pay | Admitting: Internal Medicine

## 2019-07-28 MED ORDER — BREXPIPRAZOLE 2 MG PO TABS: 2.0000 mg | ORAL_TABLET | Freq: Every day | ORAL | 1 refills | Status: DC

## 2019-08-07 NOTE — Patient Instructions (Addendum)
DUE TO COVID-19 ONLY ONE VISITOR IS ALLOWED TO COME WITH YOU AND STAY IN THE WAITING ROOM ONLY DURING PRE OP AND PROCEDURE DAY OF SURGERY. THE 1 VISITOR MAY VISIT WITH YOU AFTER SURGERY IN YOUR PRIVATE ROOM DURING VISITING HOURS ONLY!  YOU NEED TO HAVE A COVID 19 TEST ON: 08/11/19 @ 8:55 am, THIS TEST MUST BE DONE BEFORE SURGERY, COME  [place], [place] , [address].  ([place]) ONCE YOUR COVID TEST IS COMPLETED, PLEASE BEGIN THE QUARANTINE INSTRUCTIONS AS OUTLINED IN YOUR HANDOUT.                Carrie Thompson    Your procedure is scheduled on: 08/15/19   Report to [facility] Main  Entrance   Report to short stay at: 5:30 AM     Call this number if you have problems the morning of surgery [phone]    Remember:   NO SOLID FOOD AFTER MIDNIGHT THE NIGHT PRIOR TO SURGERY. NOTHING BY MOUTH EXCEPT CLEAR LIQUIDS UNTIL: 4:30 am . PLEASE FINISH GATORADE DRINK PER SURGEON ORDER  WHICH NEEDS TO BE COMPLETED AT .   CLEAR LIQUID DIET   Foods Allowed                                                                     Foods Excluded  Coffee and tea, regular and decaf                             liquids that you cannot  Plain Jell-O any favor except red or purple                                           see through such as: Fruit ices (not with fruit pulp)                                     milk, soups, orange juice  Iced Popsicles                                    All solid food Carbonated beverages, regular and diet                                    Cranberry, grape and apple juices Sports drinks like Gatorade Lightly seasoned clear broth or consume(fat free) Sugar, honey syrup  Sample Menu Breakfast                                Lunch                                     Supper Cranberry juice  Beef broth                            Chicken broth Jell-O                                     Grape juice                            Apple juice Coffee or tea                        Jell-O                                      Popsicle                                                Coffee or tea                        Coffee or tea  _____________________________________________________________________  BRUSH YOUR TEETH MORNING OF SURGERY AND RINSE YOUR MOUTH OUT, NO CHEWING GUM CANDY OR MINTS.     Take these medicines the morning of surgery with A SIP OF WATER: FLUOXETINE.                                  You may not have any metal on your body including hair pins and              piercings  Do not wear jewelry, make-up, lotions, powders or perfumes, deodorant             Do not wear nail polish on your fingernails.  Do not shave  48 hours prior to surgery.                Do not bring valuables to the hospital. [place].  Contacts, dentures or bridgework may not be worn into surgery.  Leave suitcase in the car. After surgery it may be brought to your room.     Patients discharged the day of surgery will not be allowed to drive home. IF YOU ARE HAVING SURGERY AND GOING HOME THE SAME DAY, YOU MUST HAVE AN ADULT TO DRIVE YOU HOME AND BE WITH YOU FOR 24 HOURS. YOU MAY GO HOME BY TAXI OR UBER OR ORTHERWISE, BUT AN ADULT MUST ACCOMPANY YOU HOME AND STAY WITH YOU FOR 24 HOURS.  Name and phone number of your driver:  Special Instructions: N/A              Please read over the following fact sheets you were given: _____________________________________________________________________             [facility] - Preparing for Surgery Before surgery, you can play an important role.  Because skin is not sterile, your skin needs to be as free of germs as possible.  You can reduce the number  of germs on your skin by washing with CHG (chlorahexidine gluconate) soap before surgery.  CHG is an antiseptic cleaner which kills germs and bonds with the skin to continue killing germs even  after washing. Please DO NOT use if you have an allergy to CHG or antibacterial soaps.  If your skin becomes reddened/irritated stop using the CHG and inform your nurse when you arrive at Short Stay. Do not shave (including legs and underarms) for at least 48 hours prior to the first CHG shower.  You may shave your face/neck. Please follow these instructions carefully:  1.  Shower with CHG Soap the night before surgery and the  morning of Surgery.  2.  If you choose to wash your hair, wash your hair first as usual with your  normal  shampoo.  3.  After you shampoo, rinse your hair and body thoroughly to remove the  shampoo.                           4.  Use CHG as you would any other liquid soap.  You can apply chg directly  to the skin and wash                       Gently with a scrungie or clean washcloth.  5.  Apply the CHG Soap to your body ONLY FROM THE NECK DOWN.   Do not use on face/ open                           Wound or open sores. Avoid contact with eyes, ears mouth and genitals (private parts).                       Wash face,  Genitals (private parts) with your normal soap.             6.  Wash thoroughly, paying special attention to the area where your surgery  will be performed.  7.  Thoroughly rinse your body with warm water from the neck down.  8.  DO NOT shower/wash with your normal soap after using and rinsing off  the CHG Soap.                9.  Pat yourself dry with a clean towel.            10.  Wear clean pajamas.            11.  Place clean sheets on your bed the night of your first shower and do not  sleep with pets. Day of Surgery : Do not apply any lotions/deodorants the morning of surgery.  Please wear clean clothes to the hospital/surgery center.  FAILURE TO FOLLOW THESE INSTRUCTIONS MAY RESULT IN THE CANCELLATION OF YOUR SURGERY PATIENT SIGNATURE_________________________________  NURSE  SIGNATURE__________________________________  ________________________________________________________________________   Carrie Thompson  An incentive spirometer is a tool that can help keep your lungs clear and active. This tool measures how well you are filling your lungs with each breath. Taking long deep breaths may help reverse or decrease the chance of developing breathing (pulmonary) problems (especially infection) following:  A long period of time when you are unable to move or be active. BEFORE THE PROCEDURE   If the spirometer includes an indicator to show your best effort, your nurse or respiratory therapist will set it to a desired  goal.  If possible, sit up straight or lean slightly forward. Try not to slouch.  Hold the incentive spirometer in an upright position. INSTRUCTIONS FOR USE  1. Sit on the edge of your bed if possible, or sit up as far as you can in bed or on a chair. 2. Hold the incentive spirometer in an upright position. 3. Breathe out normally. 4. Place the mouthpiece in your mouth and seal your lips tightly around it. 5. Breathe in slowly and as deeply as possible, raising the piston or the ball toward the top of the column. 6. Hold your breath for 3-5 seconds or for as long as possible. Allow the piston or ball to fall to the bottom of the column. 7. Remove the mouthpiece from your mouth and breathe out normally. 8. Rest for a few seconds and repeat Steps 1 through 7 at least 10 times every 1-2 hours when you are awake. Take your time and take a few normal breaths between deep breaths. 9. The spirometer may include an indicator to show your best effort. Use the indicator as a goal to work toward during each repetition. 10. After each set of 10 deep breaths, practice coughing to be sure your lungs are clear. If you have an incision (the cut made at the time of surgery), support your incision when coughing by placing a pillow or rolled up towels firmly  against it. Once you are able to get out of bed, walk around indoors and cough well. You may stop using the incentive spirometer when instructed by your caregiver.  RISKS AND COMPLICATIONS  Take your time so you do not get dizzy or light-headed.  If you are in pain, you may need to take or ask for pain medication before doing incentive spirometry. It is harder to take a deep breath if you are having pain. AFTER USE  Rest and breathe slowly and easily.  It can be helpful to keep track of a log of your progress. Your caregiver can provide you with a simple table to help with this. If you are using the spirometer at home, follow these instructions: [place] IF:   You are having difficultly using the spirometer.  You have trouble using the spirometer as often as instructed.  Your pain medication is not giving enough relief while using the spirometer.  You develop fever of 100.5 F (38.1 C) or higher. SEEK IMMEDIATE MEDICAL CARE IF:   You cough up bloody sputum that had not been present before.  You develop fever of 102 F (38.9 C) or greater.  You develop worsening pain at or near the incision site. MAKE SURE YOU:   Understand these instructions.  Will watch your condition.  Will get help right away if you are not doing well or get worse. Document Released: 07/20/2006 Document Revised: 06/01/2011 Document Reviewed: 09/20/2006 [facility] Patient Information 2014 [place], [place].   ________________________________________________________________________

## 2019-08-08 ENCOUNTER — Encounter (HOSPITAL_COMMUNITY): Payer: Self-pay

## 2019-08-08 ENCOUNTER — Encounter (HOSPITAL_COMMUNITY)
Admission: RE | Admit: 2019-08-08 | Discharge: 2019-08-08 | Disposition: A | Discharge status: Home / Self Care | Payer: BC Managed Care – PPO | Source: Ambulatory Visit | Attending: Surgery | Admitting: Surgery

## 2019-08-08 ENCOUNTER — Other Ambulatory Visit: Payer: Self-pay

## 2019-08-08 DIAGNOSIS — Z01812 Encounter for preprocedural laboratory examination: Secondary | ICD-10-CM | POA: Diagnosis not present

## 2019-08-08 LAB — COMPREHENSIVE METABOLIC PANEL WITH GFR
ALT: 23 U/L (ref 0–44)
AST: 20 U/L (ref 15–41)
Albumin: 3.7 g/dL (ref 3.5–5.0)
Alkaline Phosphatase: 82 U/L (ref 38–126)
Anion gap: 10 (ref 5–15)
BUN: 14 mg/dL (ref 6–20)
CO2: 25 mmol/L (ref 22–32)
Calcium: 8.6 mg/dL — ABNORMAL LOW (ref 8.9–10.3)
Chloride: 104 mmol/L (ref 98–111)
Creatinine, Ser: 0.88 mg/dL (ref 0.44–1.00)
GFR calc Af Amer: >60 mL/min
GFR calc non Af Amer: >60 mL/min
Glucose, Bld: 129 mg/dL — ABNORMAL HIGH (ref 70–99)
Potassium: 3.6 mmol/L (ref 3.5–5.1)
Sodium: 139 mmol/L (ref 135–145)
Total Bilirubin: 0.9 mg/dL (ref 0.3–1.2)
Total Protein: 6.6 g/dL (ref 6.5–8.1)

## 2019-08-08 LAB — CBC WITH DIFFERENTIAL/PLATELET
Abs Immature Granulocytes: 0.04 10*3/uL (ref 0.00–0.07)
Basophils Absolute: 0 10*3/uL (ref 0.0–0.1)
Basophils Relative: 0 %
Eosinophils Absolute: 0.1 10*3/uL (ref 0.0–0.5)
Eosinophils Relative: 1 %
HCT: 39.5 % (ref 36.0–46.0)
Hemoglobin: 13 g/dL (ref 12.0–15.0)
Immature Granulocytes: 0 %
Lymphocytes Relative: 23 %
Lymphs Abs: 2.4 10*3/uL (ref 0.7–4.0)
MCH: 30.1 pg (ref 26.0–34.0)
MCHC: 32.9 g/dL (ref 30.0–36.0)
MCV: 91.4 fL (ref 80.0–100.0)
Monocytes Absolute: 0.3 10*3/uL (ref 0.1–1.0)
Monocytes Relative: 3 %
Neutro Abs: 7.5 10*3/uL (ref 1.7–7.7)
Neutrophils Relative %: 73 %
Platelets: 309 10*3/uL (ref 150–400)
RBC: 4.32 MIL/uL (ref 3.87–5.11)
RDW: 12 % (ref 11.5–15.5)
WBC: 10.3 10*3/uL (ref 4.0–10.5)
nRBC: 0 % (ref 0.0–0.2)

## 2019-08-08 LAB — PREGNANCY, URINE: Preg Test, Ur: NEGATIVE

## 2019-08-08 NOTE — Progress Notes (Signed)
PCP - Webb Silversmith:  Cardiologist -   Chest x-ray - 06/27/19 EKG - 06/27/19 Stress Test -  ECHO -  Cardiac Cath -   Sleep Study -  CPAP -   Fasting Blood Sugar -  Checks Blood Sugar _____ times a day  Blood Thinner Instructions: Aspirin Instructions: Last Dose:  Anesthesia review:   Patient denies shortness of breath, fever, cough and chest pain at PAT appointment   Patient verbalized understanding of instructions that were given to them at the PAT appointment. Patient was also instructed that they will need to review over the PAT instructions again at home before surgery.

## 2019-08-09 DIAGNOSIS — F988 Other specified behavioral and emotional disorders with onset usually occurring in childhood and adolescence: Secondary | ICD-10-CM | POA: Diagnosis not present

## 2019-08-09 DIAGNOSIS — F329 Major depressive disorder, single episode, unspecified: Secondary | ICD-10-CM | POA: Diagnosis not present

## 2019-08-09 DIAGNOSIS — F419 Anxiety disorder, unspecified: Secondary | ICD-10-CM | POA: Diagnosis not present

## 2019-08-09 LAB — ABO/RH: ABO/RH(D): A POS

## 2019-08-11 ENCOUNTER — Other Ambulatory Visit (HOSPITAL_COMMUNITY)
Admission: RE | Admit: 2019-08-11 | Discharge: 2019-08-11 | Disposition: A | Discharge status: Home / Self Care | Payer: BC Managed Care – PPO | Source: Ambulatory Visit | Attending: Surgery | Admitting: Surgery

## 2019-08-11 DIAGNOSIS — Z20822 Contact with and (suspected) exposure to covid-19: Secondary | ICD-10-CM | POA: Insufficient documentation

## 2019-08-11 DIAGNOSIS — Z01812 Encounter for preprocedural laboratory examination: Secondary | ICD-10-CM | POA: Diagnosis not present

## 2019-08-11 LAB — SARS CORONAVIRUS 2 (TAT 6-24 HRS): SARS Coronavirus 2: NEGATIVE

## 2019-08-14 MED ORDER — BUPIVACAINE LIPOSOME 1.3 % IJ SUSP
20.0000 mL | INTRAMUSCULAR | Status: DC
  Filled 2019-08-14: qty 20

## 2019-08-14 NOTE — Anesthesia Preprocedure Evaluation (Addendum)
Anesthesia Evaluation  Patient identified by MRN, date of birth, ID band Patient awake    Reviewed: Allergy & Precautions, NPO status , Patient's Chart, lab work & pertinent test results  History of Anesthesia Complications (+) AWARENESS UNDER ANESTHESIA  Airway Mallampati: III  TM Distance: >3 FB Neck ROM: Full    Dental no notable dental hx. (+) Dental Advisory Given   Pulmonary neg pulmonary ROS,    Pulmonary exam normal        Cardiovascular negative cardio ROS Normal cardiovascular exam     Neuro/Psych negative neurological ROS     GI/Hepatic Neg liver ROS, GERD  ,  Endo/Other  Morbid obesity  Renal/GU negative Renal ROS     Musculoskeletal negative musculoskeletal ROS (+)   Abdominal   Peds  Hematology negative hematology ROS (+)   Anesthesia Other Findings   Reproductive/Obstetrics                            Anesthesia Physical Anesthesia Plan  ASA: III  Anesthesia Plan: General   Post-op Pain Management:    Induction: Intravenous  PONV Risk Score and Plan: 4 or greater and Ondansetron, Dexamethasone, Midazolam and Scopolamine patch - Pre-op  Airway Management Planned: Oral ETT  Additional Equipment:   Intra-op Plan:   Post-operative Plan: Extubation in OR  Informed Consent: I have reviewed the patients History and Physical, chart, labs and discussed the procedure including the risks, benefits and alternatives for the proposed anesthesia with the patient or authorized representative who has indicated his/her understanding and acceptance.     Dental advisory given  Plan Discussed with: Anesthesiologist and CRNA  Anesthesia Plan Comments:        Anesthesia Quick Evaluation

## 2019-08-15 ENCOUNTER — Other Ambulatory Visit: Payer: Self-pay

## 2019-08-15 ENCOUNTER — Ambulatory Visit (HOSPITAL_COMMUNITY): Payer: BC Managed Care – PPO | Admitting: Certified Registered Nurse Anesthetist

## 2019-08-15 ENCOUNTER — Encounter (HOSPITAL_COMMUNITY): Payer: Self-pay | Admitting: Surgery

## 2019-08-15 ENCOUNTER — Ambulatory Visit (HOSPITAL_COMMUNITY)
Admission: RE | Admit: 2019-08-15 | Discharge: 2019-08-16 | Disposition: A | Discharge status: Home / Self Care | Payer: BC Managed Care – PPO | Attending: Surgery | Admitting: Surgery

## 2019-08-15 ENCOUNTER — Encounter (HOSPITAL_COMMUNITY)
Admission: RE | Disposition: A | Discharge status: Home / Self Care | Payer: Self-pay | Source: Home / Self Care | Attending: Surgery

## 2019-08-15 DIAGNOSIS — Z6841 Body Mass Index (BMI) 40.0 and over, adult: Secondary | ICD-10-CM | POA: Diagnosis not present

## 2019-08-15 DIAGNOSIS — Z79899 Other long term (current) drug therapy: Secondary | ICD-10-CM | POA: Diagnosis not present

## 2019-08-15 DIAGNOSIS — F419 Anxiety disorder, unspecified: Secondary | ICD-10-CM | POA: Diagnosis not present

## 2019-08-15 DIAGNOSIS — K219 Gastro-esophageal reflux disease without esophagitis: Secondary | ICD-10-CM | POA: Diagnosis not present

## 2019-08-15 DIAGNOSIS — F329 Major depressive disorder, single episode, unspecified: Secondary | ICD-10-CM | POA: Insufficient documentation

## 2019-08-15 DIAGNOSIS — F418 Other specified anxiety disorders: Secondary | ICD-10-CM | POA: Diagnosis not present

## 2019-08-15 DIAGNOSIS — J45909 Unspecified asthma, uncomplicated: Secondary | ICD-10-CM | POA: Diagnosis not present

## 2019-08-15 HISTORY — PX: LAPAROSCOPIC GASTRIC SLEEVE RESECTION: SHX5895

## 2019-08-15 LAB — TYPE AND SCREEN
ABO/RH(D): A POS
Antibody Screen: NEGATIVE

## 2019-08-15 LAB — PREGNANCY, URINE: Preg Test, Ur: NEGATIVE

## 2019-08-15 SURGERY — GASTRECTOMY, SLEEVE, LAPAROSCOPIC
Anesthesia: General

## 2019-08-15 MED ORDER — ACETAMINOPHEN 500 MG PO TABS: 1000.0000 mg | ORAL_TABLET | Freq: Three times a day (TID) | ORAL | Status: DC

## 2019-08-15 MED ORDER — ROCURONIUM BROMIDE 10 MG/ML (PF) SYRINGE
PREFILLED_SYRINGE | INTRAVENOUS | Status: DC | PRN
  Administered 2019-08-15: 10 mg via INTRAVENOUS
  Administered 2019-08-15: 20 mg via INTRAVENOUS
  Administered 2019-08-15: 50 mg via INTRAVENOUS

## 2019-08-15 MED ORDER — GABAPENTIN 300 MG PO CAPS
300.0000 mg | ORAL_CAPSULE | ORAL | Status: AC
  Administered 2019-08-15: 300 mg via ORAL
  Filled 2019-08-15: qty 1

## 2019-08-15 MED ORDER — OXYCODONE HCL 5 MG/5ML PO SOLN
5.0000 mg | Freq: Four times a day (QID) | ORAL | Status: DC | PRN
  Administered 2019-08-15: 5 mg via ORAL
  Filled 2019-08-15: qty 5

## 2019-08-15 MED ORDER — SCOPOLAMINE 1 MG/3DAYS TD PT72
1.0000 | MEDICATED_PATCH | TRANSDERMAL | Status: DC
  Filled 2019-08-15: qty 1

## 2019-08-15 MED ORDER — FENTANYL CITRATE (PF) 250 MCG/5ML IJ SOLN
INTRAMUSCULAR | Status: AC
  Filled 2019-08-15: qty 5

## 2019-08-15 MED ORDER — FENTANYL CITRATE (PF) 100 MCG/2ML IJ SOLN
INTRAMUSCULAR | Status: AC
  Filled 2019-08-15: qty 2

## 2019-08-15 MED ORDER — APREPITANT 40 MG PO CAPS
40.0000 mg | ORAL_CAPSULE | ORAL | Status: AC
  Administered 2019-08-15: 40 mg via ORAL
  Filled 2019-08-15: qty 1

## 2019-08-15 MED ORDER — ROCURONIUM BROMIDE 10 MG/ML (PF) SYRINGE
PREFILLED_SYRINGE | INTRAVENOUS | Status: AC
  Filled 2019-08-15: qty 10

## 2019-08-15 MED ORDER — LACTATED RINGERS IR SOLN
Status: DC | PRN
  Administered 2019-08-15: 1000 mL

## 2019-08-15 MED ORDER — LIDOCAINE 2% (20 MG/ML) 5 ML SYRINGE
INTRAMUSCULAR | Status: DC | PRN
  Administered 2019-08-15: 1.5 mg/kg/h via INTRAVENOUS

## 2019-08-15 MED ORDER — BUPIVACAINE LIPOSOME 1.3 % IJ SUSP
INTRAMUSCULAR | Status: DC | PRN
  Administered 2019-08-15: 20 mL

## 2019-08-15 MED ORDER — SUGAMMADEX SODIUM 500 MG/5ML IV SOLN
INTRAVENOUS | Status: DC | PRN
Start: 2019-08-15 — End: 2019-08-15
  Administered 2019-08-15: 300 mg via INTRAVENOUS

## 2019-08-15 MED ORDER — EPHEDRINE SULFATE-NACL 50-0.9 MG/10ML-% IV SOSY
PREFILLED_SYRINGE | INTRAVENOUS | Status: DC | PRN
  Administered 2019-08-15 (×2): 10 mg via INTRAVENOUS

## 2019-08-15 MED ORDER — SODIUM CHLORIDE 0.9 % IV SOLN: INTRAVENOUS | Status: DC

## 2019-08-15 MED ORDER — CHLORHEXIDINE GLUCONATE 4 % EX LIQD: 60.0000 mL | Freq: Once | CUTANEOUS | Status: DC

## 2019-08-15 MED ORDER — ACETAMINOPHEN 500 MG PO TABS
1000.0000 mg | ORAL_TABLET | ORAL | Status: AC
  Administered 2019-08-15: 1000 mg via ORAL

## 2019-08-15 MED ORDER — HEPARIN SODIUM (PORCINE) 5000 UNIT/ML IJ SOLN
5000.0000 [IU] | INTRAMUSCULAR | Status: AC
  Administered 2019-08-15: 5000 [IU] via SUBCUTANEOUS
  Filled 2019-08-15: qty 1

## 2019-08-15 MED ORDER — ENOXAPARIN SODIUM 30 MG/0.3ML ~~LOC~~ SOLN
30.0000 mg | Freq: Two times a day (BID) | SUBCUTANEOUS | Status: DC
  Administered 2019-08-15 – 2019-08-16 (×2): 30 mg via SUBCUTANEOUS
  Filled 2019-08-15 (×2): qty 0.3

## 2019-08-15 MED ORDER — KETOROLAC TROMETHAMINE 15 MG/ML IJ SOLN
15.0000 mg | Freq: Three times a day (TID) | INTRAMUSCULAR | Status: DC | PRN
  Administered 2019-08-16: 15 mg via INTRAVENOUS
  Filled 2019-08-15: qty 1

## 2019-08-15 MED ORDER — SODIUM CHLORIDE 0.9 % IV SOLN
2.0000 g | INTRAVENOUS | Status: AC
  Administered 2019-08-15: 2 g via INTRAVENOUS
  Filled 2019-08-15: qty 2

## 2019-08-15 MED ORDER — KETAMINE HCL 10 MG/ML IJ SOLN
INTRAMUSCULAR | Status: DC | PRN
  Administered 2019-08-15: 30 mg via INTRAVENOUS

## 2019-08-15 MED ORDER — FENTANYL CITRATE (PF) 100 MCG/2ML IJ SOLN: 25.0000 ug | INTRAMUSCULAR | Status: DC | PRN

## 2019-08-15 MED ORDER — METOCLOPRAMIDE HCL 5 MG/ML IJ SOLN
10.0000 mg | Freq: Four times a day (QID) | INTRAMUSCULAR | Status: DC
  Administered 2019-08-15 – 2019-08-16 (×4): 10 mg via INTRAVENOUS
  Filled 2019-08-15 (×4): qty 2

## 2019-08-15 MED ORDER — CELECOXIB 200 MG PO CAPS
200.0000 mg | ORAL_CAPSULE | Freq: Once | ORAL | Status: AC
  Administered 2019-08-15: 200 mg via ORAL
  Filled 2019-08-15: qty 1

## 2019-08-15 MED ORDER — KETAMINE HCL 10 MG/ML IJ SOLN
INTRAMUSCULAR | Status: AC
  Filled 2019-08-15: qty 1

## 2019-08-15 MED ORDER — BUPIVACAINE HCL 0.25 % IJ SOLN
INTRAMUSCULAR | Status: AC
  Filled 2019-08-15: qty 1

## 2019-08-15 MED ORDER — ACETAMINOPHEN 160 MG/5ML PO SOLN
1000.0000 mg | Freq: Three times a day (TID) | ORAL | Status: DC
  Administered 2019-08-15 – 2019-08-16 (×3): 1000 mg via ORAL
  Filled 2019-08-15 (×3): qty 40.6

## 2019-08-15 MED ORDER — ONDANSETRON HCL 4 MG/2ML IJ SOLN
INTRAMUSCULAR | Status: AC
  Filled 2019-08-15: qty 2

## 2019-08-15 MED ORDER — PANTOPRAZOLE SODIUM 40 MG IV SOLR
40.0000 mg | Freq: Every day | INTRAVENOUS | Status: DC
  Administered 2019-08-15: 40 mg via INTRAVENOUS
  Filled 2019-08-15: qty 40

## 2019-08-15 MED ORDER — PROMETHAZINE HCL 25 MG/ML IJ SOLN
6.2500 mg | INTRAMUSCULAR | Status: AC | PRN
  Administered 2019-08-15 (×2): 6.25 mg via INTRAVENOUS

## 2019-08-15 MED ORDER — PROPOFOL 10 MG/ML IV BOLUS
INTRAVENOUS | Status: DC | PRN
  Administered 2019-08-15: 200 mg via INTRAVENOUS

## 2019-08-15 MED ORDER — HYDRALAZINE HCL 20 MG/ML IJ SOLN: 10.0000 mg | INTRAMUSCULAR | Status: DC | PRN

## 2019-08-15 MED ORDER — BREXPIPRAZOLE 2 MG PO TABS
2.0000 mg | ORAL_TABLET | Freq: Every day | ORAL | Status: DC
  Administered 2019-08-15: 2 mg via ORAL
  Filled 2019-08-15 (×2): qty 1

## 2019-08-15 MED ORDER — DEXAMETHASONE SODIUM PHOSPHATE 10 MG/ML IJ SOLN
INTRAMUSCULAR | Status: DC | PRN
  Administered 2019-08-15: 6 mg via INTRAVENOUS

## 2019-08-15 MED ORDER — BUPIVACAINE-EPINEPHRINE 0.25% -1:200000 IJ SOLN
INTRAMUSCULAR | Status: DC | PRN
  Administered 2019-08-15: 30 mL

## 2019-08-15 MED ORDER — FENTANYL CITRATE (PF) 250 MCG/5ML IJ SOLN
INTRAMUSCULAR | Status: DC | PRN
  Administered 2019-08-15: 50 ug via INTRAVENOUS
  Administered 2019-08-15: 100 ug via INTRAVENOUS
  Administered 2019-08-15 (×2): 50 ug via INTRAVENOUS

## 2019-08-15 MED ORDER — HYDROMORPHONE HCL 1 MG/ML IJ SOLN
0.5000 mg | INTRAMUSCULAR | Status: DC | PRN
  Administered 2019-08-15: 0.5 mg via INTRAVENOUS
  Filled 2019-08-15: qty 0.5

## 2019-08-15 MED ORDER — ONDANSETRON HCL 4 MG/2ML IJ SOLN: 4.0000 mg | INTRAMUSCULAR | Status: DC | PRN

## 2019-08-15 MED ORDER — LIDOCAINE 2% (20 MG/ML) 5 ML SYRINGE
INTRAMUSCULAR | Status: DC | PRN
  Administered 2019-08-15: 100 mg via INTRAVENOUS

## 2019-08-15 MED ORDER — SUGAMMADEX SODIUM 500 MG/5ML IV SOLN
INTRAVENOUS | Status: AC
  Filled 2019-08-15: qty 5

## 2019-08-15 MED ORDER — PROPOFOL 10 MG/ML IV BOLUS
INTRAVENOUS | Status: AC
  Filled 2019-08-15: qty 40

## 2019-08-15 MED ORDER — FLUOXETINE HCL 20 MG PO CAPS
20.0000 mg | ORAL_CAPSULE | Freq: Every day | ORAL | Status: DC
  Administered 2019-08-15 – 2019-08-16 (×2): 20 mg via ORAL
  Filled 2019-08-15 (×2): qty 1

## 2019-08-15 MED ORDER — METHOCARBAMOL 500 MG IVPB - SIMPLE MED
500.0000 mg | Freq: Four times a day (QID) | INTRAVENOUS | Status: DC | PRN
  Filled 2019-08-15: qty 50

## 2019-08-15 MED ORDER — MIDAZOLAM HCL 2 MG/2ML IJ SOLN
INTRAMUSCULAR | Status: AC
  Filled 2019-08-15: qty 2

## 2019-08-15 MED ORDER — ONDANSETRON HCL 4 MG/2ML IJ SOLN
INTRAMUSCULAR | Status: DC | PRN
  Administered 2019-08-15: 4 mg via INTRAVENOUS

## 2019-08-15 MED ORDER — LACTATED RINGERS IV SOLN: INTRAVENOUS | Status: DC

## 2019-08-15 MED ORDER — ACETAMINOPHEN 500 MG PO TABS
1000.0000 mg | ORAL_TABLET | Freq: Once | ORAL | Status: AC
  Filled 2019-08-15: qty 2

## 2019-08-15 MED ORDER — ENSURE MAX PROTEIN PO LIQD
2.0000 [oz_av] | ORAL | Status: DC
  Administered 2019-08-16 (×2): 2 [oz_av] via ORAL

## 2019-08-15 MED ORDER — AMPHETAMINE-DEXTROAMPHETAMINE 10 MG PO TABS: 30.0000 mg | ORAL_TABLET | Freq: Two times a day (BID) | ORAL | Status: DC

## 2019-08-15 MED ORDER — TRAMADOL HCL 50 MG PO TABS: 50.0000 mg | ORAL_TABLET | Freq: Four times a day (QID) | ORAL | Status: DC | PRN

## 2019-08-15 MED ORDER — SCOPOLAMINE 1 MG/3DAYS TD PT72
1.0000 | MEDICATED_PATCH | TRANSDERMAL | Status: DC
  Administered 2019-08-15: 1.5 mg via TRANSDERMAL

## 2019-08-15 MED ORDER — DEXAMETHASONE SODIUM PHOSPHATE 10 MG/ML IJ SOLN
INTRAMUSCULAR | Status: AC
  Filled 2019-08-15: qty 1

## 2019-08-15 MED ORDER — METOPROLOL TARTRATE 5 MG/5ML IV SOLN: 5.0000 mg | Freq: Four times a day (QID) | INTRAVENOUS | Status: DC | PRN

## 2019-08-15 MED ORDER — LIDOCAINE 2% (20 MG/ML) 5 ML SYRINGE
INTRAMUSCULAR | Status: AC
  Filled 2019-08-15: qty 5

## 2019-08-15 MED ORDER — GABAPENTIN 100 MG PO CAPS
200.0000 mg | ORAL_CAPSULE | Freq: Two times a day (BID) | ORAL | Status: DC
  Administered 2019-08-15 – 2019-08-16 (×3): 200 mg via ORAL
  Filled 2019-08-15 (×3): qty 2

## 2019-08-15 MED ORDER — DOCUSATE SODIUM 100 MG PO CAPS
100.0000 mg | ORAL_CAPSULE | Freq: Two times a day (BID) | ORAL | Status: DC
  Administered 2019-08-15 – 2019-08-16 (×3): 100 mg via ORAL
  Filled 2019-08-15 (×3): qty 1

## 2019-08-15 MED ORDER — ZOLPIDEM TARTRATE 5 MG PO TABS: 5.0000 mg | ORAL_TABLET | Freq: Every evening | ORAL | Status: DC | PRN

## 2019-08-15 MED ORDER — PROMETHAZINE HCL 25 MG/ML IJ SOLN
INTRAMUSCULAR | Status: AC
  Filled 2019-08-15: qty 1

## 2019-08-15 MED ORDER — MIDAZOLAM HCL 5 MG/5ML IJ SOLN
INTRAMUSCULAR | Status: DC | PRN
  Administered 2019-08-15: 2 mg via INTRAVENOUS

## 2019-08-15 MED ORDER — SUCCINYLCHOLINE CHLORIDE 200 MG/10ML IV SOSY
PREFILLED_SYRINGE | INTRAVENOUS | Status: AC
  Filled 2019-08-15: qty 10

## 2019-08-15 MED ORDER — FLUOXETINE HCL 20 MG PO CAPS
40.0000 mg | ORAL_CAPSULE | Freq: Every day | ORAL | Status: DC
  Administered 2019-08-15 – 2019-08-16 (×2): 40 mg via ORAL
  Filled 2019-08-15 (×2): qty 2

## 2019-08-15 MED ORDER — SUCCINYLCHOLINE CHLORIDE 200 MG/10ML IV SOSY
PREFILLED_SYRINGE | INTRAVENOUS | Status: DC | PRN
  Administered 2019-08-15: 140 mg via INTRAVENOUS

## 2019-08-15 SURGICAL SUPPLY — 81 items
APL PRP STRL LF DISP 70% ISPRP (MISCELLANEOUS) ×2
APL SKNCLS STERI-STRIP NONHPOA (GAUZE/BANDAGES/DRESSINGS) ×1
APL SWBSTK 6 STRL LF DISP (MISCELLANEOUS)
APPLICATOR COTTON TIP 6 STRL (MISCELLANEOUS) IMPLANT
APPLICATOR COTTON TIP 6IN STRL (MISCELLANEOUS)
APPLIER CLIP ROT 10 11.4 M/L (STAPLE)
APPLIER CLIP ROT 13.4 12 LRG (CLIP)
APR CLP LRG 13.4X12 ROT 20 MLT (CLIP)
APR CLP MED LRG 11.4X10 (STAPLE)
BAG LAPAROSCOPIC 12 15 PORT 16 (BASKET) IMPLANT
BAG RETRIEVAL 12/15 (BASKET)
BAG RETRIEVAL 12/15MM (BASKET)
BENZOIN TINCTURE PRP APPL 2/3 (GAUZE/BANDAGES/DRESSINGS) ×3 IMPLANT
BLADE SURG SZ11 CARB STEEL (BLADE) ×3 IMPLANT
BNDG ADH 1X3 SHEER STRL LF (GAUZE/BANDAGES/DRESSINGS) ×18 IMPLANT
BNDG ADH THN 3X1 STRL LF (GAUZE/BANDAGES/DRESSINGS) ×6
CABLE HIGH FREQUENCY MONO STRZ (ELECTRODE) ×3 IMPLANT
CHLORAPREP W/TINT 26 (MISCELLANEOUS) ×6 IMPLANT
CLIP APPLIE ROT 10 11.4 M/L (STAPLE) IMPLANT
CLIP APPLIE ROT 13.4 12 LRG (CLIP) IMPLANT
CLOSURE WOUND 1/2 X4 (GAUZE/BANDAGES/DRESSINGS) ×1
COVER SURGICAL LIGHT HANDLE (MISCELLANEOUS) ×3 IMPLANT
COVER WAND RF STERILE (DRAPES) IMPLANT
DECANTER SPIKE VIAL GLASS SM (MISCELLANEOUS) ×3 IMPLANT
DEVICE SUT QUICK LOAD TK 5 (STAPLE) IMPLANT
DEVICE SUT TI-KNOT TK 5X26 (MISCELLANEOUS) IMPLANT
DEVICE TI KNOT TK5 (MISCELLANEOUS)
DRAPE UTILITY XL STRL (DRAPES) ×6 IMPLANT
ELECT REM PT RETURN 15FT ADLT (MISCELLANEOUS) ×3 IMPLANT
GAUZE SPONGE 4X4 12PLY STRL (GAUZE/BANDAGES/DRESSINGS) IMPLANT
GLOVE BIO SURGEON STRL SZ 6 (GLOVE) ×3 IMPLANT
GLOVE INDICATOR 6.5 STRL GRN (GLOVE) ×3 IMPLANT
GOWN STRL REUS W/TWL LRG LVL3 (GOWN DISPOSABLE) ×3 IMPLANT
GOWN STRL REUS W/TWL XL LVL3 (GOWN DISPOSABLE) ×6 IMPLANT
GRASPER SUT TROCAR 14GX15 (MISCELLANEOUS) ×3 IMPLANT
HOVERMATT SINGLE USE (MISCELLANEOUS) ×3 IMPLANT
KIT BASIN (CUSTOM PROCEDURE TRAY) ×3 IMPLANT
KIT TURNOVER KIT A (KITS) IMPLANT
MARKER SKIN DUAL TIP RULER LAB (MISCELLANEOUS) ×3 IMPLANT
NDL SPNL 22GX3.5 QUINCKE BK (NEEDLE) ×1 IMPLANT
NEEDLE SPNL 22GX3.5 QUINCKE BK (NEEDLE) ×3 IMPLANT
PACK UNIVERSAL I (CUSTOM PROCEDURE TRAY) ×3 IMPLANT
PENCIL SMOKE EVACUATOR (MISCELLANEOUS) IMPLANT
QUICK LOAD TK 5 (STAPLE)
RELOAD ENDO STITCH (ENDOMECHANICALS) IMPLANT
RELOAD STAPLE 60 3.6 BLU REG (STAPLE) ×1 IMPLANT
RELOAD STAPLE 60 3.8 GOLD REG (STAPLE) ×1 IMPLANT
RELOAD STAPLE 60 4.1 GRN THCK (STAPLE) IMPLANT
RELOAD STAPLER BLUE 60MM (STAPLE) ×3 IMPLANT
RELOAD STAPLER GOLD 60MM (STAPLE) ×1 IMPLANT
RELOAD STAPLER GREEN 60MM (STAPLE) ×1 IMPLANT
RELOAD SUT TRIPLE-STITCH 2-0 (ENDOMECHANICALS) IMPLANT
SCISSORS LAP 5X45 EPIX DISP (ENDOMECHANICALS) ×3 IMPLANT
SET IRRIG TUBING LAPAROSCOPIC (IRRIGATION / IRRIGATOR) ×3 IMPLANT
SET TUBE SMOKE EVAC HIGH FLOW (TUBING) ×3 IMPLANT
SHEARS HARMONIC ACE PLUS 45CM (MISCELLANEOUS) ×3 IMPLANT
SLEEVE ADV FIXATION 5X100MM (TROCAR) ×6 IMPLANT
SLEEVE GASTRECTOMY 40FR VISIGI (MISCELLANEOUS) ×3 IMPLANT
SOL ANTI FOG 6CC (MISCELLANEOUS) ×1 IMPLANT
SOLUTION ANTI FOG 6CC (MISCELLANEOUS) ×2
SPONGE LAP 18X18 RF (DISPOSABLE) ×3 IMPLANT
STAPLER ECHELON BIOABSB 60 FLE (MISCELLANEOUS) ×14 IMPLANT
STAPLER ECHELON LONG 60 440 (INSTRUMENTS) ×3 IMPLANT
STAPLER RELOAD BLUE 60MM (STAPLE) ×9
STAPLER RELOAD GOLD 60MM (STAPLE) ×3
STAPLER RELOAD GREEN 60MM (STAPLE) ×3
STRIP CLOSURE SKIN 1/2X4 (GAUZE/BANDAGES/DRESSINGS) ×2 IMPLANT
SUT MNCRL AB 4-0 PS2 18 (SUTURE) ×3 IMPLANT
SUT SURGIDAC NAB ES-9 0 48 120 (SUTURE) IMPLANT
SUT VICRYL 0 TIES 12 18 (SUTURE) ×3 IMPLANT
SYR 10ML ECCENTRIC (SYRINGE) ×3 IMPLANT
SYR 20ML LL LF (SYRINGE) ×3 IMPLANT
SYR 50ML LL SCALE MARK (SYRINGE) ×3 IMPLANT
TOWEL OR 17X26 10 PK STRL BLUE (TOWEL DISPOSABLE) ×3 IMPLANT
TOWEL OR NON WOVEN STRL DISP B (DISPOSABLE) ×3 IMPLANT
TROCAR ADV FIXATION 5X100MM (TROCAR) ×3 IMPLANT
TROCAR BLADELESS 15MM (ENDOMECHANICALS) ×3 IMPLANT
TROCAR BLADELESS OPT 5 100 (ENDOMECHANICALS) ×3 IMPLANT
TUBING CONNECTING 10 (TUBING) ×2 IMPLANT
TUBING CONNECTING 10' (TUBING) ×1
TUBING ENDO SMARTCAP (MISCELLANEOUS) ×3 IMPLANT

## 2019-08-15 NOTE — Interval H&P Note (Signed)
History and Physical Interval Note:  08/15/2019 7:05 AM  Carrie Thompson  has presented today for surgery, with the diagnosis of Morbid Obesity.  The various methods of treatment have been discussed with the patient and family. After consideration of risks, benefits and other options for treatment, the patient has consented to  Procedure(s): LAPAROSCOPIC GASTRIC SLEEVE RESECTION, Upper Endo, ERAS Pathway (N/A) as a surgical intervention.  The patient's history has been reviewed, patient examined, no change in status, stable for surgery.  I have reviewed the patient's chart and labs.  Questions were answered to the patient's satisfaction.     Abshir Paolini Rich Brave

## 2019-08-15 NOTE — Progress Notes (Signed)
Earrings were able to be removed, ring was not. IV location moved to right side.

## 2019-08-15 NOTE — Transfer of Care (Signed)
Immediate Anesthesia Transfer of Care Note  Patient: Carrie Thompson  Procedure(s) Performed: LAPAROSCOPIC GASTRIC SLEEVE RESECTION, Upper Endo, ERAS Pathway (N/A )  Patient Location: PACU  Anesthesia Type:General  Level of Consciousness: awake, alert  and patient cooperative  Airway & Oxygen Therapy: Patient Spontanous Breathing and Patient connected to face mask oxygen  Post-op Assessment: Report given to RN and Post -op Vital signs reviewed and stable  Post vital signs: Reviewed and stable  Last Vitals:  Vitals Value Taken Time  BP 151/95 08/15/19 0915  Temp 36.4 C 08/15/19 0912  Pulse 88 08/15/19 0916  Resp 22 08/15/19 0916  SpO2 99 % 08/15/19 0916  Vitals shown include unvalidated device data.  Last Pain:  Vitals:   08/15/19 0915  TempSrc:   PainSc: Asleep      Patients Stated Pain Goal: 4 ([id number])  Complications: No apparent anesthesia complications

## 2019-08-15 NOTE — Discharge Instructions (Signed)
° ° ° °GASTRIC BYPASS/SLEEVE ° Home Care Instructions ° ° These instructions are to help you care for yourself when you go home. ° °Call: If you have any problems. °• Call [phone] and ask for the surgeon on call °• If you need immediate help, come to the ER at [facility].  °• Tell the ER staff that you are a new post-op gastric bypass or gastric sleeve patient °  °Signs and symptoms to report: • Severe vomiting or nausea °o If you cannot keep down clear liquids for longer than 1 day, call your surgeon  °• Abdominal pain that does not get better after taking your pain medication °• Fever over 100.4° F with chills °• Heart beating over 100 beats a minute °• Shortness of breath at rest °• Chest pain °•  Redness, swelling, drainage, or foul odor at incision (surgical) sites °•  If your incisions open or pull apart °• Swelling or pain in calf (lower leg) °• Diarrhea (Loose bowel movements that happen often), frequent watery, uncontrolled bowel movements °• Constipation, (no bowel movements for 3 days) if this happens: Pick one °o Milk of Magnesia, 2 tablespoons by mouth, 3 times a day for 2 days if needed °o Stop taking Milk of Magnesia once you have a bowel movement °o Call your doctor if constipation continues °Or °o Miralax  (instead of Milk of Magnesia) following the label instructions °o Stop taking Miralax once you have a bowel movement °o Call your doctor if constipation continues °• Anything you think is not normal °  °Normal side effects after surgery: • Unable to sleep at night or unable to focus °• Irritability or moody °• Being tearful (crying) or depressed °These are common complaints, possibly related to your anesthesia medications that put you to sleep, stress of surgery, and change in lifestyle.  This usually goes away a few weeks after surgery.  If these feelings continue, call your primary care doctor. °  °Wound Care: You may have surgical glue, steri-strips, or staples over your incisions after  surgery °• Surgical glue:  Looks like a clear film over your incisions and will wear off a little at a time °• Steri-strips: Strips of tape over your incisions. You may notice a yellowish color on the skin under the steri-strips. This is used to make the   steri-strips stick better. Do not pull the steri-strips off - let them fall off °• Staples: Staples may be removed before you leave the hospital °o If you go home with staples, call [facility], [phone] at for an appointment with your surgeon’s nurse to have staples removed 10 days after surgery. °• Showering: You may shower two (2) days after your surgery unless your surgeon tells you differently °o Wash gently around incisions with warm soapy water, rinse well, and gently pat dry  °o No tub baths until staples are removed, steri-strips fall off or glue is gone.  °  °Medications: • Medications should be liquid or crushed if larger than the size of a dime °• Extended release pills (medication that release a little bit at a time through the day) should NOT be crushed or cut. (examples include XL, ER, DR, SR) °• Depending on the size and number of medications you take, you may need to space (take a few throughout the day)/change the time you take your medications so that you do not over-fill your pouch (smaller stomach) °• Make sure you follow-up with your primary care doctor to   make medication changes needed during rapid weight loss and life-style changes °• If you have diabetes, follow up with the doctor that orders your diabetes medication(s) within one week after surgery and check your blood sugar regularly. °• Do not drive while taking prescription pain medication  °• It is ok to take Tylenol by the bottle instructions with your pain medicine or instead of your pain medicine as needed.  DO NOT TAKE NSAIDS (EXAMPLES OF NSAIDS:  IBUPROFREN/ NAPROXEN)  °Diet:                    First 2 Weeks ° You will see the dietician t about two (2) weeks  after your surgery. The dietician will increase the types of foods you can eat if you are handling liquids well: °• If you have severe vomiting or nausea and cannot keep down clear liquids lasting longer than 1 day, call your surgeon @ ([phone]) °Protein Shake °• Drink at least 2 ounces of shake 5-6 times per day °• Each serving of protein shakes (usually 8 - 12 ounces) should have: °o 15 grams of protein  °o And no more than 5 grams of carbohydrate  °• Goal for protein each day: °o Men = 80 grams per day °o Women = 60 grams per day °• Protein powder may be added to fluids such as non-fat milk or Lactaid milk or unsweetened Soy/Almond milk (limit to 35 grams added protein powder per serving) ° °Hydration °• Slowly increase the amount of water and other clear liquids as tolerated (See Acceptable Fluids) °• Slowly increase the amount of protein shake as tolerated  °•  Sip fluids slowly and throughout the day.  Do not use straws. °• May use sugar substitutes in small amounts (no more than 6 - 8 packets per day; i.e. Splenda) ° °Fluid Goal °• The first goal is to drink at least 8 ounces of protein shake/drink per day (or as directed by the nutritionist); some examples of protein shakes are Syntrax Nectar, Adkins Advantage, EAS Edge HP, and Unjury. See handout from pre-op Bariatric Education Class: °o Slowly increase the amount of protein shake you drink as tolerated °o You may find it easier to slowly sip shakes throughout the day °o It is important to get your proteins in first °• Your fluid goal is to drink 64 - 100 ounces of fluid daily °o It may take a few weeks to build up to this °• 32 oz (or more) should be clear liquids  °And  °• 32 oz (or more) should be full liquids (see below for examples) °• Liquids should not contain sugar, caffeine, or carbonation ° °Clear Liquids: °• Water or Sugar-free flavored water (i.e. Fruit H2O, Propel) °• Decaffeinated coffee or tea (sugar-free) °• Crystal Lite, Wyler’s Lite,  Minute Maid Lite °• Sugar-free Jell-O °• Bouillon or broth °• Sugar-free Popsicle:   *Less than 20 calories each; Limit 1 per day ° °Full Liquids: °Protein Shakes/Drinks + 2 choices per day of other full liquids °• Full liquids must be: °o No More Than 15 grams of Carbs per serving  °o No More Than 3 grams of Fat per serving °• Strained low-fat cream soup (except Cream of Potato or Tomato) °• Non-Fat milk °• Fat-free Lactaid Milk °• Unsweetened Soy Or Unsweetened Almond Milk °• Low Sugar yogurt (Dannon Lite & Fit, Greek yogurt; Oikos Triple Zero; Chobani Simply 100; Yoplait 100 calorie Greek - No Fruit on the Bottom) ° °  °Vitamins   and Minerals • Start 1 day after surgery unless otherwise directed by your surgeon °• 2 Chewable Bariatric Specific Multivitamin / Multimineral Supplement with iron (Example: Bariatric Advantage Multi EA) °• Chewable Calcium with Vitamin D-3 °(Example: 3 Chewable Calcium Plus 600 with Vitamin D-3) °o Take 500 mg three (3) times a day for a total of 1500 mg each day °o Do not take all 3 doses of calcium at one time as it may cause constipation, and you can only absorb 500 mg  at a time  °o Do not mix multivitamins containing iron with calcium supplements; take 2 hours apart °• Menstruating women and those with a history of anemia (a blood disease that causes weakness) may need extra iron °o Talk with your doctor to see if you need more iron °• Do not stop taking or change any vitamins or minerals until you talk to your dietitian or surgeon °• Your Dietitian and/or surgeon must approve all vitamin and mineral supplements °  °Activity and Exercise: Limit your physical activity as instructed by your doctor.  It is important to continue walking at home.  During this time, use these guidelines: °• Do not lift anything greater than ten (10) pounds for at least two (2) weeks °• Do not go back to work or drive until your surgeon says you can °• You may have sex when you feel comfortable  °o It is  VERY important for female patients to use a reliable birth control method; fertility often increases after surgery  °o All hormonal birth control will be ineffective for 30 days after surgery due to medications given during surgery a barrier method must be used. °o Do not get pregnant for at least 18 months °• Start exercising as soon as your doctor tells you that you can °o Make sure your doctor approves any physical activity °• Start with a simple walking program °• Walk 5-15 minutes each day, 7 days per week.  °• Slowly increase until you are walking 30-45 minutes per day °Consider joining our [employer] program. [phone] or email [email or web address] °  °Special Instructions Things to remember: °• Use your CPAP when sleeping if this applies to you ° °• [facility] has two free Bariatric Surgery Support Groups that meet monthly °o The 3rd Thursday of each month, 6 pm, [facility] Class[facility]  °o The 2nd Friday of each month, 11:45 am in the private dining room in the basement of  °• It is very important to keep all follow up appointments with your surgeon, dietitian, primary care physician, and behavioral health practitioner °• Routine follow up schedule with your surgeon include appointments at 2-3 weeks, 6-8 weeks, 6 months, and 1 year at a minimum.  Your surgeon may request to see you more often.   °o After the first year, please follow up with your bariatric surgeon and dietitian at least once a year in order to maintain best weight loss results °[facility]: [phone] ° [facility]: [phone] °Bariatric Nurse Coordinator: [phone] °  °   Reviewed and Endorsed  °by [facility] Patient Education Committee, June, 2016 °Edits Approved: Aug, 2018 ° ° ° °

## 2019-08-15 NOTE — Anesthesia Procedure Notes (Signed)
Procedure Name: Intubation Date/Time: 08/15/2019 7:30 AM Performed by: West Pugh, CRNA Pre-anesthesia Checklist: Patient identified, Emergency Drugs available, Suction available, Patient being monitored and Timeout performed Patient Re-evaluated:Patient Re-evaluated prior to induction Oxygen Delivery Method: Circle system utilized Preoxygenation: Pre-oxygenation with 100% oxygen Induction Type: IV induction and Cricoid Pressure applied Laryngoscope Size: Mac and 3 Grade View: Grade I Tube type: Oral Tube size: 7.0 mm Number of attempts: 1 Airway Equipment and Method: Stylet Placement Confirmation: ETT inserted through vocal cords under direct vision,  positive ETCO2,  CO2 detector and breath sounds checked- equal and bilateral Secured at: 21 cm Tube secured with: Tape Dental Injury: Teeth and Oropharynx as per pre-operative assessment  Comments: AOI

## 2019-08-15 NOTE — Progress Notes (Signed)
PHARMACY CONSULT FOR:  Risk Assessment for Post-Discharge VTE Following Bariatric Surgery  Post-Discharge VTE Risk Assessment: This patient's probability of 30-day post-discharge VTE is increased due to the factors marked:   Female    Age >/=60 years  X BMI >/=50 kg/m2    CHF    Dyspnea at Rest    Paraplegia  X  Non-gastric-band surgery    Operation Time >/=3 hr    Return to OR     Length of Stay >/= 3 d      Hx of VTE   Hypercoagulable condition   Significant venous stasis   Predicted probability of 30-day post-discharge VTE: 0.27%  Other patient-specific factors to consider: none   Recommendation for Discharge: . No pharmacologic prophylaxis post-discharge . Follow daily and recalculate estimated 30d VTE risk if returns to OR or LOS reaches 3 days.   Carrie Thompson is a 29 y.o. female who underwent laparoscopic sleeve gastrectomy on 5/25   Case start: 0752 Case end: 0854   Allergies  Allergen Reactions  . Shellfish Allergy Anaphylaxis  . Iodine Other (See Comments)    Pt states that she avoids iodine because of her reaction to shellfish.      Patient Measurements: Height: 5\' 8"  (172.7 cm) Weight: (!) 152 kg (335 lb) IBW/kg (Calculated) : 63.9 Body mass index is 50.94 kg/m.  No results for input(s): WBC, HGB, HCT, PLT, APTT, CREATININE, LABCREA, CREATININE, CREAT24HRUR, MG, PHOS, ALBUMIN, PROT, ALBUMIN, AST, ALT, ALKPHOS, BILITOT, BILIDIR, IBILI in the last 72 hours. Estimated Creatinine Clearance: 147.6 mL/min (by C-G formula based on SCr of 0.88 mg/dL).    Past Medical History:  Diagnosis Date  . Abnormal Pap smear   . Anxiety   . Chicken pox   . Depression   . Hx of varicella   . 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 Vaginal Pap smear, abnormal      Medications Prior to Admission  Medication Sig Dispense Refill Last Dose  . amphetamine-dextroamphetamine (ADDERALL) 30 MG tablet Take 1 tablet by mouth 2  (two) times daily. 60 tablet 0 08/14/2019 at Unknown time  . brexpiprazole (REXULTI) 2 MG TABS tablet Take 1 tablet (2 mg total) by mouth daily. 90 tablet 1 08/14/2019 at Unknown time  . FLUoxetine (PROZAC) 20 MG capsule Take 1 capsule (20 mg total) by mouth daily. 90 capsule 1 08/14/2019 at Unknown time  . FLUoxetine (PROZAC) 40 MG capsule Take 1 capsule (40 mg total) by mouth daily. 90 capsule 1 08/14/2019 at Unknown time  . Multiple Vitamins-Minerals (MULTIVITAMIN WITH MINERALS) tablet Take 1 tablet by mouth daily.   08/14/2019 at Unknown time  . zolpidem (AMBIEN) 10 MG tablet Take 1 tablet (10 mg total) by mouth at bedtime as needed for sleep. (Patient taking differently: Take 10 mg by mouth at bedtime. ) 30 tablet 0 08/14/2019 at Unknown time      Reuel Boom, PharmD, BCPS [phone] 08/15/2019, 12:38 PM

## 2019-08-15 NOTE — Progress Notes (Signed)
Discussed post op day goals with patient including ambulation, IS, diet progression, pain, and nausea control.  BSTOP education provided including BSTOP information guide, "Guide for Pain Management after your Bariatric Procedure".  Questions answered. 

## 2019-08-15 NOTE — Anesthesia Postprocedure Evaluation (Signed)
Anesthesia Post Note  Patient: Carrie Thompson  Procedure(s) Performed: LAPAROSCOPIC GASTRIC SLEEVE RESECTION, Upper Endo, ERAS Pathway (N/A )     Patient location during evaluation: PACU Anesthesia Type: General Level of consciousness: sedated Pain management: pain level controlled Vital Signs Assessment: post-procedure vital signs reviewed and stable Respiratory status: spontaneous breathing and respiratory function stable Cardiovascular status: stable Postop Assessment: no apparent nausea or vomiting Anesthetic complications: no    Last Vitals:  Vitals:   08/15/19 1015 08/15/19 1030  BP: (!) 142/89 (!) 141/95  Pulse: 94 85  Resp: 18 16  Temp:  36.4 C  SpO2: 94% 95%    Last Pain:  Vitals:   08/15/19 1030  TempSrc:   PainSc: Asleep                 [name] DANIEL

## 2019-08-15 NOTE — Op Note (Signed)
Operative Note  Carrie Thompson  [id number]  [id number]  08/15/2019   Surgeon: Clovis Riley MD   Assistant: Greer Pickerel MD   Procedure performed: laparoscopic sleeve gastrectomy, upper endoscopy   Preop diagnosis: Morbid obesity Body mass index is 50.94 kg/m. Post-op diagnosis/intraop findings: same   Specimens: fundus Retained items: none  EBL: minimal cc Complications: none   Description of procedure: After obtaining informed consent and administration of chemical DVT prophylaxis in holding, the patient was taken to the operating room and placed supine on operating room table where general endotracheal anesthesia was initiated, preoperative antibiotics were administered, SCDs applied, and a formal timeout was performed. The abdomen was prepped and draped in usual sterile fashion. Peritoneal access was gained using a Visiport technique in the left upper quadrant and insufflation to 15 mmHg ensued without issue. Gross inspection revealed no evidence of injury. Under direct visualization three more 5 mm trochars were placed in the right and left hemiabdomen and the 3mm trocar in the right paramedian upper abdomen. Bilateral laparoscopic assisted TAPS blocks were performed with Exparel diluted with 0.25 percent Marcaine. The patient was placed in steep Trendelenburg and the liver retractor was introduced through an incision in the upper midline and secured to the post externally to maintain the left lobe retracted anteriorly.  There was no hiatal hernia. Using the Harmonic scalpel, the greater curvature of the stomach was dissected away from the greater omentum and short gastric vessels were divided. This began 6 cm from the pylorus, and dissection proceeded until the left crus was clearly exposed. There were some filmy adhesions of the posterior stomach to the pancreas which were divided with the Harmonic. Esophageal fat pad was mobilized off the anterior stomach slightly. The 29 [place] VisiGi  was then introduced and directed down towards the pylorus. This was placed to suction against the lesser curve. Serial fires of the linear cutting stapler with seamguards were then employed to create our sleeve. The first fire used a green load and ensured adequate room at the angularis incisura. One gold load and then three blue loads were then employed to create a narrow tubular stomach up to the angle of His. The excised stomach was then removed through our 15 mm trocar site within an Endo Catch bag. The visigi was taken off of suction and a few puffs of air were introduced, inflating the sleeve. No bubbles were observed in the irrigation fluid around the stomach and the shape was noted to be evenly tubular without any narrowing at the angularis. The visigi was then removed. Upper endoscopy was performed by the assistant surgeon and the sleeve was noted to be airtight, the staple line was hemostatic, the lumen evenly tubular without twisting, angulation or undue narrowing. Please see his separate note. The endoscope was removed. The 15 mm trocar site fascia in the right upper abdomen was closed with a 0 Vicryl using the laparoscopic suture passer under direct visualization. The liver retractor was removed under direct visualization. The abdomen was then desufflated and all remaining trochars removed. The skin incisions were closed with subcuticular Monocryl; benzoin, Steri-Strips and Band-Aids were applied The patient was then awakened, extubated and taken to PACU in stable condition.     All counts were correct at the completion of the case.

## 2019-08-15 NOTE — Op Note (Signed)
Carrie Thompson:[id number] June 09, 1990 08/15/2019  Preoperative diagnosis: severe obesity  Postoperative diagnosis: Same   Procedure: upper endoscopy   Surgeon: Leighton Ruff. Michele Judy M.D., FACS   Anesthesia: Gen.   Indications for procedure: 29 y.o. year old female undergoing Laparoscopic Gastric Sleeve Resection and an EGD was requested to evaluate the new gastric sleeve.   Description of procedure: After we have completed the sleeve resection, I scrubbed out and obtained the Olympus endoscope. I gently placed endoscope in the patient's oropharynx and gently glided it down the esophagus without any difficulty under direct visualization. Once I was in the gastric sleeve, I insufflated the stomach with air. I was able to cannulate and advanced the scope through the gastric sleeve. I was able to cannulate the duodenum with ease. Dr. Kae Heller had placed saline in the upper abdomen. Upon further insufflation of the gastric sleeve there was no evidence of bubbles. GE junction located at 38 cm. Normal appearing Z line.   Upon further inspection of the gastric sleeve, the mucosa appeared normal. There is no evidence of any mucosal abnormality. The sleeve was widely patent at the angularis. There was no evidence of bleeding. The gastric sleeve was decompressed. The scope was withdrawn. The patient tolerated this portion of the procedure well. Please see Dr Ron Parker operative note for details regarding the laparoscopic gastric sleeve resection.   Leighton Ruff. Redmond Pulling, MD, FACS  General, Bariatric, & Minimally Invasive Surgery  [facility], [place]

## 2019-08-16 DIAGNOSIS — F329 Major depressive disorder, single episode, unspecified: Secondary | ICD-10-CM | POA: Diagnosis not present

## 2019-08-16 DIAGNOSIS — K219 Gastro-esophageal reflux disease without esophagitis: Secondary | ICD-10-CM | POA: Diagnosis not present

## 2019-08-16 DIAGNOSIS — Z6841 Body Mass Index (BMI) 40.0 and over, adult: Secondary | ICD-10-CM | POA: Diagnosis not present

## 2019-08-16 DIAGNOSIS — Z79899 Other long term (current) drug therapy: Secondary | ICD-10-CM | POA: Diagnosis not present

## 2019-08-16 DIAGNOSIS — F419 Anxiety disorder, unspecified: Secondary | ICD-10-CM | POA: Diagnosis not present

## 2019-08-16 LAB — COMPREHENSIVE METABOLIC PANEL
ALT: 27 U/L (ref 0–44)
AST: 21 U/L (ref 15–41)
Albumin: 3.6 g/dL (ref 3.5–5.0)
Alkaline Phosphatase: 78 U/L (ref 38–126)
Anion gap: 7 (ref 5–15)
BUN: 11 mg/dL (ref 6–20)
CO2: 27 mmol/L (ref 22–32)
Calcium: 8.3 mg/dL — ABNORMAL LOW (ref 8.9–10.3)
Chloride: 106 mmol/L (ref 98–111)
Creatinine, Ser: 0.77 mg/dL (ref 0.44–1.00)
GFR calc Af Amer: >60 mL/min (ref >60)
GFR calc non Af Amer: >60 mL/min (ref >60)
Glucose, Bld: 134 mg/dL — ABNORMAL HIGH (ref 70–99)
Potassium: 4 mmol/L (ref 3.5–5.1)
Sodium: 140 mmol/L (ref 135–145)
Total Bilirubin: 0.9 mg/dL (ref 0.3–1.2)
Total Protein: 6.6 g/dL (ref 6.5–8.1)

## 2019-08-16 LAB — CBC WITH DIFFERENTIAL/PLATELET
Abs Immature Granulocytes: 0.07 10*3/uL (ref 0.00–0.07)
Basophils Absolute: 0 10*3/uL (ref 0.0–0.1)
Basophils Relative: 0 %
Eosinophils Absolute: 0 10*3/uL (ref 0.0–0.5)
Eosinophils Relative: 0 %
HCT: 39.8 % (ref 36.0–46.0)
Hemoglobin: 13.1 g/dL (ref 12.0–15.0)
Immature Granulocytes: 1 %
Lymphocytes Relative: 11 %
Lymphs Abs: 1.5 10*3/uL (ref 0.7–4.0)
MCH: 30.3 pg (ref 26.0–34.0)
MCHC: 32.9 g/dL (ref 30.0–36.0)
MCV: 92.1 fL (ref 80.0–100.0)
Monocytes Absolute: 0.5 10*3/uL (ref 0.1–1.0)
Monocytes Relative: 4 %
Neutro Abs: 11.4 10*3/uL — ABNORMAL HIGH (ref 1.7–7.7)
Neutrophils Relative %: 84 %
Platelets: 332 10*3/uL (ref 150–400)
RBC: 4.32 MIL/uL (ref 3.87–5.11)
RDW: 12.2 % (ref 11.5–15.5)
WBC: 13.5 10*3/uL — ABNORMAL HIGH (ref 4.0–10.5)
nRBC: 0 % (ref 0.0–0.2)

## 2019-08-16 LAB — SURGICAL PATHOLOGY

## 2019-08-16 LAB — MAGNESIUM: Magnesium: 2.1 mg/dL (ref 1.7–2.4)

## 2019-08-16 MED ORDER — DOCUSATE SODIUM 100 MG PO CAPS: 100.0000 mg | ORAL_CAPSULE | Freq: Two times a day (BID) | ORAL | 0 refills | Status: DC

## 2019-08-16 MED ORDER — TRAMADOL HCL 50 MG PO TABS: 50.0000 mg | ORAL_TABLET | Freq: Four times a day (QID) | ORAL | 0 refills | Status: DC | PRN

## 2019-08-16 MED ORDER — PANTOPRAZOLE SODIUM 40 MG PO TBEC
40.0000 mg | DELAYED_RELEASE_TABLET | Freq: Every day | ORAL | 0 refills | Status: DC
Start: 2019-08-16 — End: 2020-05-15

## 2019-08-16 MED ORDER — ONDANSETRON 4 MG PO TBDP: 4.0000 mg | ORAL_TABLET | Freq: Four times a day (QID) | ORAL | 0 refills | Status: DC | PRN

## 2019-08-16 MED ORDER — ACETAMINOPHEN 500 MG PO TABS
1000.0000 mg | ORAL_TABLET | Freq: Three times a day (TID) | ORAL | 0 refills | Status: AC
Start: 2019-08-16 — End: 2019-08-21

## 2019-08-16 MED ORDER — GABAPENTIN 100 MG PO CAPS: 200.0000 mg | ORAL_CAPSULE | Freq: Two times a day (BID) | ORAL | 0 refills | Status: DC

## 2019-08-16 NOTE — Progress Notes (Signed)
Patient alert and oriented, pain is controlled. Patient is tolerating fluids, advanced to protein shake today, patient is tolerating well.  Reviewed Gastric sleeve discharge instructions with patient and patient is able to articulate understanding.  Provided information on BELT program, Support Group and [facility] outpatient pharmacy. All questions answered, will continue to monitor.  Total fluid intake 717 Per dehydration protocol call back one week postop

## 2019-08-16 NOTE — Discharge Summary (Signed)
Physician Discharge Summary  Carrie Thompson EHM:[id number] DOB: 05/15/1990 DOA: 08/15/2019  PCP: Jearld Fenton, NP  Admit date: 08/15/2019 Discharge date: 08/16/2019   Recommendations for Outpatient Follow-up:   Follow-up Information     Clovis Riley, MD. Go on 09/06/2019.   Specialty: General Surgery Why: at 9 am.  Please arrive 15 minutes prior to your appointment.  Thank you. Contact information: [address] [place] [address] [phone]         Surgery, [place]. Go on 10/11/2019.   Specialty: General Surgery Why: at 920 am with Dr Romana Juniper.  Please arrive 15 minutes prior to appointment time.  Thank you Contact information: [place] [address] [phone]           Discharge Diagnoses:  Active Problems:   Morbid obesity ([place])   Surgical Procedure: Laparoscopic Sleeve Gastrectomy, upper endoscopy  Discharge Condition: Good Disposition: Home  Diet recommendation: Postoperative sleeve gastrectomy diet (liquids only)  Filed Weights   08/15/19 0540 08/15/19 0612  Weight: (!) 152 kg (!) 152 kg     Hospital Course:  The patient was admitted for a planned laparoscopic sleeve gastrectomy. Please see operative note. Preoperatively the patient was given 5000 units of subcutaneous heparin for DVT prophylaxis. Postoperative prophylactic Lovenox dosing was started on the evening of postoperative day 0. ERAS protocol was used. On the evening of postoperative day 0, the patient was started on water and ice chips. On postoperative day 1 the patient had no fever or tachycardia and was tolerating water in their diet was gradually advanced throughout the day. The patient was ambulating without difficulty. Their vital signs are stable without fever or tachycardia. Their hemoglobin had remained stable. The patient had received discharge instructions and counseling. They were deemed stable for discharge and had  met discharge criteria  Vitals:   08/16/19 0539 08/16/19 0539  BP: (!) 159/95 (!) 159/95  Pulse: 97 (!) 101  Resp: 17 17  Temp: 98.4 F (36.9 C) 98.4 F (36.9 C)  SpO2: 95% 96%    Alert and comfortable Unlabored respirations Abdomen soft, nontender. Incisions c/d/i no cellulitis or hematoma.   Discharge Instructions  Discharge Instructions     Ambulate hourly while awake   Complete by: As directed    Call MD for:  difficulty breathing, headache or visual disturbances   Complete by: As directed    Call MD for:  persistant dizziness or light-headedness   Complete by: As directed    Call MD for:  persistant nausea and vomiting   Complete by: As directed    Call MD for:  redness, tenderness, or signs of infection (pain, swelling, redness, odor or green/yellow discharge around incision site)   Complete by: As directed    Call MD for:  severe uncontrolled pain   Complete by: As directed    Call MD for:  temperature >101 F   Complete by: As directed    Incentive spirometry   Complete by: As directed    Perform hourly while awake      Allergies as of 08/16/2019       Reactions   Shellfish Allergy Anaphylaxis   Iodine Other (See Comments)   Pt states that she avoids iodine because of her reaction to shellfish.          Medication List     TAKE these medications    acetaminophen 500 MG tablet Commonly known as: TYLENOL Take  2 tablets (1,000 mg total) by mouth every 8 (eight) hours for 5 days.   amphetamine-dextroamphetamine 30 MG tablet Commonly known as: ADDERALL Take 1 tablet by mouth 2 (two) times daily.   brexpiprazole 2 MG Tabs tablet Commonly known as: Rexulti Take 1 tablet (2 mg total) by mouth daily.   docusate sodium 100 MG capsule Commonly known as: COLACE Take 1 capsule (100 mg total) by mouth 2 (two) times daily. OK to decrease to once daily or stop taking if having diarrhea or loose bowel movements   FLUoxetine 40 MG capsule Commonly known  as: PROZAC Take 1 capsule (40 mg total) by mouth daily.   FLUoxetine 20 MG capsule Commonly known as: PROZAC Take 1 capsule (20 mg total) by mouth daily.   gabapentin 100 MG capsule Commonly known as: NEURONTIN Take 2 capsules (200 mg total) by mouth every 12 (twelve) hours.   multivitamin with minerals tablet Take 1 tablet by mouth daily.   ondansetron 4 MG disintegrating tablet Commonly known as: ZOFRAN-ODT Take 1 tablet (4 mg total) by mouth every 6 (six) hours as needed for nausea or vomiting.   pantoprazole 40 MG tablet Commonly known as: PROTONIX Take 1 tablet (40 mg total) by mouth daily.   traMADol 50 MG tablet Commonly known as: ULTRAM Take 1 tablet (50 mg total) by mouth every 6 (six) hours as needed (pain).   zolpidem 10 MG tablet Commonly known as: Ambien Take 1 tablet (10 mg total) by mouth at bedtime as needed for sleep. What changed: when to take this       Follow-up Information     Clovis Riley, MD. Go on 09/06/2019.   Specialty: General Surgery Why: at 9 am.  Please arrive 15 minutes prior to your appointment.  Thank you. Contact information: [address] [place] [address] [phone]         Surgery, [place]. Go on 10/11/2019.   Specialty: General Surgery Why: at 920 am with Dr Romana Juniper.  Please arrive 15 minutes prior to appointment time.  Thank you Contact information: [place]  [address] [phone]             The results of significant diagnostics from this hospitalization (including imaging, microbiology, ancillary and laboratory) are listed below for reference.    Significant Diagnostic Studies: No results found.  Labs: Basic Metabolic Panel: Recent Labs  Lab 08/16/19 0420  NA 140  K 4.0  CL 106  CO2 27  GLUCOSE 134*  BUN 11  CREATININE 0.77  CALCIUM 8.3*  MG 2.1   Liver Function Tests: Recent Labs  Lab 08/16/19 0420  AST 21  ALT 27   ALKPHOS 78  BILITOT 0.9  PROT 6.6  ALBUMIN 3.6    CBC: Recent Labs  Lab 08/16/19 0420  WBC 13.5*  NEUTROABS 11.4*  HGB 13.1  HCT 39.8  MCV 92.1  PLT 332    CBG: No results for input(s): GLUCAP in the last 168 hours.  Active Problems:   Morbid obesity ([place])    Signed:  [facility], [place] 08/16/2019, 2:54 PM

## 2019-08-16 NOTE — Plan of Care (Signed)
Patient finished protein goals and has ambulated. Her pain is controlled and no nausea noted. Patient is given DC instructions and will DC home with husband.

## 2019-08-22 ENCOUNTER — Telehealth (HOSPITAL_COMMUNITY): Payer: Self-pay

## 2019-08-22 NOTE — Telephone Encounter (Signed)
Patient called to discuss post bariatric surgery follow up questions.  See below:   1.  Tell me about your pain and pain management?denies  2.  Let's talk about fluid intake.  How much total fluid are you taking in?50 + ounces  3.  How much protein have you taken in the last 2 days?45 +  4.  Have you had nausea?  Tell me about when have experienced nausea and what you did to help?denies  5.  Has the frequency or color changed with your urine? No issues  6.  Tell me what your incisions look like? Itchy one incision with redness with where tape was located on left side slightly itchy, redness not at incision site  7.  Have you been passing gas? BM?BM since discharge without use of lax  8.  If a problem or question were to arise who would you call?  Do you know contact numbers for [facility], [facility], and [facility]?aware of how to contact all services  9.  How has the walking going?walking around home as directed and been out walking in community  10.  How are your vitamins and calcium going?  How are you taking them?started mvi and calcium as directed.

## 2019-08-25 ENCOUNTER — Other Ambulatory Visit: Payer: Self-pay | Admitting: Internal Medicine

## 2019-08-25 NOTE — Telephone Encounter (Signed)
Can you touch base with her. She recently had weight loss surgery I think. We may need to discuss decreased doses.

## 2019-08-25 NOTE — Telephone Encounter (Signed)
Last filled 07/18/2019... please advise

## 2019-08-28 MED ORDER — AMPHETAMINE-DEXTROAMPHETAMINE 30 MG PO TABS: 1.0000 | ORAL_TABLET | Freq: Two times a day (BID) | ORAL | 0 refills | Status: DC

## 2019-08-28 MED ORDER — ZOLPIDEM TARTRATE 10 MG PO TABS: 10.0000 mg | ORAL_TABLET | Freq: Every evening | ORAL | 0 refills | Status: DC | PRN

## 2019-08-29 ENCOUNTER — Encounter: Payer: BC Managed Care – PPO | Attending: Surgery | Admitting: Skilled Nursing Facility1

## 2019-08-29 ENCOUNTER — Other Ambulatory Visit: Payer: Self-pay

## 2019-08-29 DIAGNOSIS — E669 Obesity, unspecified: Secondary | ICD-10-CM | POA: Insufficient documentation

## 2019-08-30 NOTE — Progress Notes (Signed)
2 Week Post-Operative Nutrition Class   Patient was seen on 05/17/18 for Post-Operative Nutrition education at the [facility].    Surgery date: 08/15/2019 Surgery type: sleeve Start weight at [facility]: 329.5 Weight today: 314.2   Body Composition Scale 08/29/2019  Total Body Fat % 47.3  Visceral Fat 14  Fat-Free Mass % 52.6   Total Body Water % 40.8   Muscle-Mass lbs 36.8  Body Fat Displacement          Torso  lbs 92.4         Left Leg  lbs 18.4         Right Leg  lbs 18.4         Left Arm  lbs 9.2         Right Arm   lbs 9.2     The following the learning objectives were met by the patient during this course:  Identifies Phase 3 (Soft, High Proteins) Dietary Goals and will begin from 2 weeks post-operatively to 2 months post-operatively  Identifies appropriate sources of fluids and proteins   States protein recommendations and appropriate sources post-operatively  Identifies the need for appropriate texture modifications, mastication, and bite sizes when consuming solids  Identifies appropriate multivitamin and calcium sources post-operatively  Describes the need for physical activity post-operatively and will follow MD recommendations  States when to call healthcare provider regarding medication questions or post-operative complications   Handouts given during class include:  Phase 3A: Soft, High Protein Diet Handout   Follow-Up Plan: Patient will follow-up at [facility] in 6 weeks for 2 month post-op nutrition visit for diet advancement per MD.

## 2019-09-04 ENCOUNTER — Telehealth: Payer: Self-pay | Admitting: Skilled Nursing Facility1

## 2019-09-04 DIAGNOSIS — Z30431 Encounter for routine checking of intrauterine contraceptive device: Secondary | ICD-10-CM | POA: Diagnosis not present

## 2019-09-04 NOTE — Telephone Encounter (Signed)
RD called pt to verify fluid intake once starting soft, solid proteins 2 week post-bariatric surgery.   Daily Fluid intake: 64+  Daily Protein intake: 60+  Concerns/issues:   No concerns  

## 2019-09-26 ENCOUNTER — Other Ambulatory Visit: Payer: Self-pay | Admitting: Internal Medicine

## 2019-09-26 MED ORDER — AMPHETAMINE-DEXTROAMPHETAMINE 30 MG PO TABS: 1.0000 | ORAL_TABLET | Freq: Two times a day (BID) | ORAL | 0 refills | Status: DC

## 2019-09-26 MED ORDER — ZOLPIDEM TARTRATE 10 MG PO TABS: 10.0000 mg | ORAL_TABLET | Freq: Every evening | ORAL | 0 refills | Status: DC | PRN

## 2019-09-26 NOTE — Telephone Encounter (Signed)
Last filled 08/28/2019... please advise

## 2019-10-10 ENCOUNTER — Other Ambulatory Visit: Payer: Self-pay

## 2019-10-10 ENCOUNTER — Encounter: Payer: Self-pay | Admitting: Dietician

## 2019-10-10 ENCOUNTER — Encounter: Payer: BC Managed Care – PPO | Attending: Surgery | Admitting: Dietician

## 2019-10-10 DIAGNOSIS — E669 Obesity, unspecified: Secondary | ICD-10-CM | POA: Diagnosis not present

## 2019-10-10 NOTE — Progress Notes (Signed)
Bariatric Nutrition Follow-Up Visit Medical Nutrition Therapy  Appt Start Time: 3:20pm    End Time: 3:50pm  2 Months Post-Operative Sleeve Surgery Surgery Date: 08/15/2019  Pt's Expectations of Surgery/ Goals: to be healthier, feel better, have more energy; would like to weigh ~190 to 200 lbs Pt Reported Successes: clothes fitting more loosely    NUTRITION ASSESSMENT  Anthropometrics  Start weight at [facility]: 329.5 lbs (date: 06/20/2019) Today's weight: 299.6 lbs  Body Composition Scale 08/29/2019 10/10/2019  Weight  lbs 314.2 299.6  BMI 47.8 45.6  Total Body Fat  % 47.3 46.3     Visceral Fat 14 13  Fat-Free Mass  % 52.6 53.6     Total Body Water  % 40.8 41.3     Muscle-Mass  lbs 36.8 36.7  Body Fat Displacement --- ---         Torso  lbs 92.4 86.1         Left Leg  lbs 18.4 17.2         Right Leg  lbs 18.4 17.2         Left Arm  lbs 9.2 8.6         Right Arm  lbs 9.2 8.6    Lifestyle & Dietary Hx Patient states she experiences "discomfort" every time she eats, no matter what or how much she eats. Typical meal pattern is 3 meals per day, usually does not snack.    24-Hr Dietary Recall First Meal: 1 [employer] egg bite  Snack: -  Second Meal: lunch meat + cheese  Snack: -  Third Meal: 1/2 hamburger patty  Snack: - Beverages: [employer] cold brew w/ skim milk & sugar-free syrup, water, Crystal Light  Estimated daily fluid intake: 64 oz Estimated daily protein intake: 60 g Supplements: bariatric MVI, calcium  Current average weekly physical activity: ADLs, plan to start working out this week    Post-Op Goals/ Signs/ Symptoms Using straws: yes Drinking while eating: no Chewing/swallowing difficulties: no Changes in vision: no Changes to mood/headaches: no Hair loss/changes to skin/nails: no Difficulty focusing/concentrating: no Sweating: no Dizziness/lightheadedness: no Palpitations: no  Carbonated/caffeinated beverages: yes (coffee)  N/V/D/C/Gas: no Abdominal pain:  no Dumping syndrome: no   NUTRITION DIAGNOSIS  Overweight/obesity ([place]-3.3) related to past poor dietary habits and physical inactivity as evidenced by completed bariatric surgery and following dietary guidelines for continued weight loss and healthy nutrition status.   NUTRITION INTERVENTION Nutrition counseling (C-1) and education (E-2) to facilitate bariatric surgery goals, including: . Diet advancement to the next phase (phase 4) now including non-starchy vegetables . The importance of consuming adequate calories as well as certain nutrients daily due to the body's need for essential vitamins, minerals, and fats . The importance of daily physical activity and to reach a goal of at least 150 minutes of moderate to vigorous physical activity weekly (or as directed by their physician) due to benefits such as increased musculature and improved lab values  Handouts Provided Include   Phase 4: Protein + Non-Starchy Vegetables   Learning Style & Readiness for Change Teaching method utilized: Visual & Auditory  Demonstrated degree of understanding via: Teach Back  Barriers to learning/adherence to lifestyle change: None Identified    MONITORING & EVALUATION Dietary intake, weekly physical activity, body weight, and goals in 4 months.  Next Steps Patient is to follow-up in 4 months for 6 month post-op follow-up.

## 2019-10-10 NOTE — Patient Instructions (Signed)

## 2019-10-23 ENCOUNTER — Encounter: Payer: BC Managed Care – PPO | Admitting: Internal Medicine

## 2019-10-30 ENCOUNTER — Telehealth: Payer: BC Managed Care – PPO | Admitting: Family

## 2019-10-30 ENCOUNTER — Other Ambulatory Visit: Payer: Self-pay | Admitting: Internal Medicine

## 2019-10-30 DIAGNOSIS — B9789 Other viral agents as the cause of diseases classified elsewhere: Secondary | ICD-10-CM

## 2019-10-30 DIAGNOSIS — J329 Chronic sinusitis, unspecified: Secondary | ICD-10-CM

## 2019-10-30 MED ORDER — ZOLPIDEM TARTRATE 10 MG PO TABS: 10.0000 mg | ORAL_TABLET | Freq: Every evening | ORAL | 0 refills | Status: DC | PRN

## 2019-10-30 MED ORDER — AMPHETAMINE-DEXTROAMPHETAMINE 30 MG PO TABS: 1.0000 | ORAL_TABLET | Freq: Two times a day (BID) | ORAL | 0 refills | Status: DC

## 2019-10-30 MED ORDER — FLUTICASONE PROPIONATE 50 MCG/ACT NA SUSP
2.0000 | Freq: Every day | NASAL | 6 refills | Status: DC
Start: 2019-10-30 — End: 2021-10-28

## 2019-10-30 NOTE — Progress Notes (Signed)
We are sorry that you are not feeling well.  Here is how we plan to help!  Based on what you have shared with me it looks like you have sinusitis.  Sinusitis is inflammation and infection in the sinus cavities of the head.  Based on your presentation I believe you most likely have Acute Viral Sinusitis.This is an infection most likely caused by a virus. There is not specific treatment for viral sinusitis other than to help you with the symptoms until the infection runs its course.  You may use an oral decongestant such as Mucinex D or if you have glaucoma or high blood pressure use plain Mucinex. Saline nasal spray help and can safely be used as often as needed for congestion, I have prescribed: Fluticasone nasal spray two sprays in each nostril once a day  Some authorities believe that zinc sprays or the use of Echinacea may shorten the course of your symptoms.  Sinus infections are not as easily transmitted as other respiratory infection, however we still recommend that you avoid close contact with loved ones, especially the very young and elderly.  Remember to wash your hands thoroughly throughout the day as this is the number one way to prevent the spread of infection!  Home Care:  Only take medications as instructed by your medical team.  Do not take these medications with alcohol.  A steam or ultrasonic humidifier can help congestion.  You can place a towel over your head and breathe in the steam from hot water coming from a faucet.  Avoid close contacts especially the very young and the elderly.  Cover your mouth when you cough or sneeze.  Always remember to wash your hands.  Get Help Right Away If:  You develop worsening fever or sinus pain.  You develop a severe head ache or visual changes.  Your symptoms persist after you have completed your treatment plan.  Make sure you  Understand these instructions.  Will watch your condition.  Will get help right away if you are not  doing well or get worse.  Your e-visit answers were reviewed by a board certified advanced clinical practitioner to complete your personal care plan.  Depending on the condition, your plan could have included both over the counter or prescription medications.  If there is a problem please reply  once you have received a response from your provider.  Your safety is important to us.  If you have drug allergies check your prescription carefully.    You can use MyChart to ask questions about today's visit, request a non-urgent call back, or ask for a work or school excuse for 24 hours related to this e-Visit. If it has been greater than 24 hours you will need to follow up with your provider, or enter a new e-Visit to address those concerns.  You will get an e-mail in the next two days asking about your experience.  I hope that your e-visit has been valuable and will speed your recovery. Thank you for using e-visits.  Greater than 5 minutes, yet less than 10 minutes of time have been spent researching, coordinating, and implementing care for this patient today.  Thank you for the details you included in the comment boxes. Those details are very helpful in determining the best course of treatment for you and help us to provide the best care.  

## 2019-10-30 NOTE — Telephone Encounter (Signed)
Adderall and Ambien last filled 09/26/2019...Marland Kitchen please advise

## 2019-11-02 ENCOUNTER — Other Ambulatory Visit: Payer: Self-pay

## 2019-11-02 ENCOUNTER — Ambulatory Visit
Admission: RE | Admit: 2019-11-02 | Discharge: 2019-11-02 | Disposition: A | Discharge status: Home / Self Care | Payer: BC Managed Care – PPO | Source: Ambulatory Visit | Attending: Emergency Medicine | Admitting: Emergency Medicine

## 2019-11-02 VITALS — BP 107/84 | HR 96 | Temp 98.4°F | Resp 16

## 2019-11-02 DIAGNOSIS — J069 Acute upper respiratory infection, unspecified: Secondary | ICD-10-CM

## 2019-11-02 NOTE — ED Provider Notes (Signed)
Roderic Palau    CSN: [id number] Arrival date & time: 11/02/19  1543      History   Chief Complaint Chief Complaint  Patient presents with  . sinus pressure  . Fatigue  . Cough  . Fever  . nasal drainage    HPI Carrie Thompson is a 29 y.o. female.   Patient presents with nonproductive cough, sinus pressure, sinus drainage, fever, chills x5 days.  She has not taken her temperature at home but felt warm.  She is taking Tylenol at home for her symptoms.  She denies rash, sore throat, shortness of breath, abdominal pain, vomiting, diarrhea, or other symptoms.  She had an e-visit on 10/30/2019; diagnosed with viral sinusitis and treated symptomatically.  The history is provided by the patient.    Past Medical History:  Diagnosis Date  . Abnormal Pap smear   . Anxiety   . Chicken pox   . Depression   . Hx of varicella   . 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 Vaginal Pap smear, abnormal     Patient Active Problem List   Diagnosis Date Noted  . Morbid obesity ([place]) 08/15/2019  . Obesity 06/20/2019  . ADD (attention deficit disorder) 01/06/2016  . Childhood asthma 10/10/2014  . Anxiety and depression 10/10/2014  . Pseudotumor cerebri 10/10/2014    Past Surgical History:  Procedure Laterality Date  . LAPAROSCOPIC GASTRIC SLEEVE RESECTION N/A 08/15/2019   Procedure: LAPAROSCOPIC GASTRIC SLEEVE RESECTION, Upper Endo, ERAS Pathway;  Surgeon: Clovis Riley, MD;  Location: [facility];  Service: General;  Laterality: N/A;    OB History    Gravida  3   Para  3   Term  3   Preterm      AB      Living  3     SAB      TAB      Ectopic      Multiple  0   Live Births  3            Home Medications    Prior to Admission medications   Medication Sig Start Date End Date Taking? Authorizing Provider  amphetamine-dextroamphetamine (ADDERALL) 30 MG tablet Take 1 tablet by mouth 2 (two) times daily. 10/30/19    Jearld Fenton, NP  brexpiprazole (REXULTI) 2 MG TABS tablet Take 1 tablet (2 mg total) by mouth daily. 07/28/19   Jearld Fenton, NP  FLUoxetine (PROZAC) 20 MG capsule Take 1 capsule (20 mg total) by mouth daily. 05/16/19   Jearld Fenton, NP  FLUoxetine (PROZAC) 40 MG capsule Take 1 capsule (40 mg total) by mouth daily. 05/09/19   Jearld Fenton, NP  fluticasone (FLONASE) 50 MCG/ACT nasal spray Place 2 sprays into both nostrils daily. 10/30/19   Kennyth Arnold, FNP  ondansetron (ZOFRAN-ODT) 4 MG disintegrating tablet Take 1 tablet (4 mg total) by mouth every 6 (six) hours as needed for nausea or vomiting. 08/16/19   Clovis Riley, MD  pantoprazole (PROTONIX) 40 MG tablet Take 1 tablet (40 mg total) by mouth daily. 08/16/19   Clovis Riley, MD  traMADol (ULTRAM) 50 MG tablet Take 1 tablet (50 mg total) by mouth every 6 (six) hours as needed (pain). 08/16/19   Clovis Riley, MD  zolpidem (AMBIEN) 10 MG tablet Take 1 tablet (10 mg total) by mouth at bedtime as needed for sleep. 10/30/19   Baity,  Coralie Keens, NP    Family History Family History  Problem Relation Age of Onset  . Other Mother        varicose veins  . Varicose Veins Mother   . Mental illness Mother   . Diabetes Father   . Mental illness Father   . Rheum arthritis Maternal Grandmother   . Hypertension Maternal Grandfather     Social History Social History   Tobacco Use  . Smoking status: Never Smoker  . Smokeless tobacco: Never Used  Vaping Use  . Vaping Use: Never used  Substance Use Topics  . Alcohol use: No    Alcohol/week: 0.0 standard drinks  . Drug use: No     Allergies   Shellfish allergy and Iodine   Review of Systems Review of Systems  Constitutional: Positive for chills, fatigue and fever.  HENT: Positive for postnasal drip, rhinorrhea and sinus pressure. Negative for ear pain and sore throat.   Eyes: Negative for pain and visual disturbance.  Respiratory: Positive for cough. Negative for  shortness of breath.   Cardiovascular: Negative for chest pain and palpitations.  Gastrointestinal: Negative for abdominal pain, diarrhea, nausea and vomiting.  Genitourinary: Negative for dysuria and hematuria.  Musculoskeletal: Negative for arthralgias and back pain.  Skin: Negative for color change and rash.  Neurological: Negative for seizures and syncope.  All other systems reviewed and are negative.    Physical Exam Triage Vital Signs ED Triage Vitals  Enc Vitals Group     BP      Pulse      Resp      Temp      Temp src      SpO2      Weight      Height      Head Circumference      Peak Flow      Pain Score      Pain Loc      Pain Edu?      Excl. in [place]?    No data found.  Updated Vital Signs BP 107/84   Pulse 96   Temp 98.4 F (36.9 C)   Resp 16   SpO2 96%   Visual Acuity Right Eye Distance:   Left Eye Distance:   Bilateral Distance:    Right Eye Near:   Left Eye Near:    Bilateral Near:     Physical Exam Vitals and nursing note reviewed.  Constitutional:      General: She is not in acute distress.    Appearance: She is well-developed. She is not ill-appearing.  HENT:     Head: Normocephalic and atraumatic.     Right Ear: Tympanic membrane normal.     Left Ear: Tympanic membrane normal.     Nose: Congestion and rhinorrhea present.     Mouth/Throat:     Mouth: Mucous membranes are moist.     Pharynx: Oropharynx is clear.  Eyes:     Conjunctiva/sclera: Conjunctivae normal.  Cardiovascular:     Rate and Rhythm: Normal rate and regular rhythm.     Heart sounds: No murmur heard.   Pulmonary:     Effort: Pulmonary effort is normal. No respiratory distress.     Breath sounds: Normal breath sounds. No wheezing or rhonchi.  Abdominal:     Palpations: Abdomen is soft.     Tenderness: There is no abdominal tenderness. There is no guarding or rebound.  Musculoskeletal:     Cervical back: Neck supple.  Skin:    General: Skin is warm and dry.      Findings: No rash.  Neurological:     General: No focal deficit present.     Mental Status: She is alert and oriented to person, place, and time.     Gait: Gait normal.  Psychiatric:        Mood and Affect: Mood normal.        Behavior: Behavior normal.      UC Treatments / Results  Labs (all labs ordered are listed, but only abnormal results are displayed) Labs Reviewed  NOVEL CORONAVIRUS, NAA    EKG   Radiology No results found.  Procedures Procedures (including critical care time)  Medications Ordered in UC Medications - No data to display  Initial Impression / Assessment and Plan / UC Course  I have reviewed the triage vital signs and the nursing notes.  Pertinent labs & imaging results that were available during my care of the patient were reviewed by me and considered in my medical decision making (see chart for details).   Viral URI.  PCR COVID pending.  Instructed patient to self quarantine until the test result is back.  Discussed symptomatic treatment with Tylenol, rest, hydration.  Instructed patient to go to the ED if she has acute worsening symptoms.  Patient agrees to plan of care.     Final Clinical Impressions(s) / UC Diagnoses   Final diagnoses:  Viral URI     Discharge Instructions     Your COVID test is pending.  You should self quarantine until the test result is back.    Take Tylenol as needed for fever or discomfort.  Rest and keep yourself hydrated.    Go to the emergency department if you develop acute worsening symptoms.        ED Prescriptions    None     PDMP not reviewed this encounter.   Sharion Balloon, NP 11/02/19 [phone]

## 2019-11-02 NOTE — ED Triage Notes (Signed)
Patient reports feeling fatigued, non-productive cough, fever, nasal drainage, and sinus pressure. Denies sore throat, chest pain, ShOB. Patient agreeable to covid testing.

## 2019-11-02 NOTE — Discharge Instructions (Signed)
Your COVID test is pending.  You should self quarantine until the test result is back.    Take Tylenol as needed for fever or discomfort.  Rest and keep yourself hydrated.    Go to the emergency department if you develop acute worsening symptoms.     

## 2019-11-04 LAB — SARS-COV-2, NAA 2 DAY TAT

## 2019-11-04 LAB — NOVEL CORONAVIRUS, NAA: SARS-CoV-2, NAA: DETECTED — AB

## 2019-11-22 ENCOUNTER — Other Ambulatory Visit: Payer: Self-pay | Admitting: Internal Medicine

## 2019-11-28 ENCOUNTER — Other Ambulatory Visit: Payer: Self-pay | Admitting: Internal Medicine

## 2019-11-29 MED ORDER — FLUOXETINE HCL 40 MG PO CAPS: 40.0000 mg | ORAL_CAPSULE | Freq: Every day | ORAL | 1 refills | Status: DC

## 2019-11-29 MED ORDER — ZOLPIDEM TARTRATE 10 MG PO TABS: 10.0000 mg | ORAL_TABLET | Freq: Every evening | ORAL | 0 refills | Status: DC | PRN

## 2019-11-29 MED ORDER — AMPHETAMINE-DEXTROAMPHETAMINE 30 MG PO TABS: 1.0000 | ORAL_TABLET | Freq: Two times a day (BID) | ORAL | 0 refills | Status: DC

## 2019-11-29 NOTE — Telephone Encounter (Signed)
Last refilled 10/30/2019...Marland Kitchen please advise

## 2019-12-27 ENCOUNTER — Other Ambulatory Visit: Payer: Self-pay | Admitting: Internal Medicine

## 2019-12-27 NOTE — Telephone Encounter (Signed)
Last filled 11/29/2019... please advise

## 2019-12-28 MED ORDER — AMPHETAMINE-DEXTROAMPHETAMINE 30 MG PO TABS: 1.0000 | ORAL_TABLET | Freq: Two times a day (BID) | ORAL | 0 refills | Status: DC

## 2019-12-28 MED ORDER — ZOLPIDEM TARTRATE 10 MG PO TABS: 10.0000 mg | ORAL_TABLET | Freq: Every evening | ORAL | 0 refills | Status: DC | PRN

## 2020-01-29 ENCOUNTER — Other Ambulatory Visit: Payer: Self-pay | Admitting: Internal Medicine

## 2020-01-29 NOTE — Telephone Encounter (Signed)
Received refill request for:  Adderral 30 mg LR 12/28/19, #60, 0 rfs  Zolpidem 10 mg LR 12/28/19  LOV 05/09/19  FOV  None scheduled  Please review and advise.   Thanks. Dm/cma

## 2020-01-29 NOTE — Telephone Encounter (Signed)
Pharmacy requests refill on: Zolpidem 10 mg  LAST REFILL: 12/27/2019 LAST OV: 05/09/2019 NEXT OV: Not Scheduled  PHARMACY: [facility] [place], [place] requests refill on: Amphetamine-Dextroamphetamine 30 mg   LAST REFILL: 12/27/2019 LAST OV: 05/09/2019 NEXT OV: Not Scheduled  PHARMACY: [facility] [place], [place]  Routed to [facility] for prescription refill.

## 2020-01-30 ENCOUNTER — Encounter: Payer: BC Managed Care – PPO | Attending: Surgery | Admitting: Skilled Nursing Facility1

## 2020-01-30 MED ORDER — ZOLPIDEM TARTRATE 10 MG PO TABS: 10.0000 mg | ORAL_TABLET | Freq: Every evening | ORAL | 0 refills | Status: DC | PRN

## 2020-01-30 MED ORDER — AMPHETAMINE-DEXTROAMPHETAMINE 30 MG PO TABS: 1.0000 | ORAL_TABLET | Freq: Two times a day (BID) | ORAL | 0 refills | Status: DC

## 2020-01-31 NOTE — Progress Notes (Unsigned)
Opened in error

## 2020-02-28 ENCOUNTER — Other Ambulatory Visit: Payer: Self-pay | Admitting: Internal Medicine

## 2020-02-29 MED ORDER — ZOLPIDEM TARTRATE 10 MG PO TABS: 10.0000 mg | ORAL_TABLET | Freq: Every evening | ORAL | 0 refills | Status: DC | PRN

## 2020-02-29 MED ORDER — AMPHETAMINE-DEXTROAMPHETAMINE 30 MG PO TABS: 1.0000 | ORAL_TABLET | Freq: Two times a day (BID) | ORAL | 0 refills | Status: DC

## 2020-02-29 NOTE — Telephone Encounter (Signed)
Last filled 01/30/2020... please advise

## 2020-03-29 ENCOUNTER — Other Ambulatory Visit: Payer: Self-pay | Admitting: Internal Medicine

## 2020-03-29 NOTE — Telephone Encounter (Signed)
Last filled 02/29/2020... note TBF on or after 03/30/2020...Marland Kitchen please advise

## 2020-03-30 MED ORDER — ZOLPIDEM TARTRATE 10 MG PO TABS: 10.0000 mg | ORAL_TABLET | Freq: Every evening | ORAL | 0 refills | Status: DC | PRN

## 2020-03-30 MED ORDER — AMPHETAMINE-DEXTROAMPHETAMINE 30 MG PO TABS: 1.0000 | ORAL_TABLET | Freq: Two times a day (BID) | ORAL | 0 refills | Status: DC

## 2020-04-10 ENCOUNTER — Telehealth: Payer: BC Managed Care – PPO | Admitting: Family

## 2020-04-10 DIAGNOSIS — H65192 Other acute nonsuppurative otitis media, left ear: Secondary | ICD-10-CM | POA: Diagnosis not present

## 2020-04-10 MED ORDER — AMOXICILLIN-POT CLAVULANATE 875-125 MG PO TABS
1.0000 | ORAL_TABLET | Freq: Two times a day (BID) | ORAL | 0 refills | Status: DC
Start: 2020-04-10 — End: 2020-05-15

## 2020-04-10 NOTE — Progress Notes (Signed)
E Visit for Swimmer's Ear  We are sorry that you are not feeling well. Here is how we plan to help!  It appears you have a ear infection. I have sent Augmentin twice a day for 7 days.  Your symptoms should improve over the next 3 days and should resolve in about 7 days.  HOME CARE:   Wash your hands frequently.  Do not place the tip of the bottle on your ear or touch it with your fingers.  You can take Acetominophen 650 mg every 4-6 hours as needed for pain.  If pain is severe or moderate, you can apply a heating pad (set on low) or hot water bottle (wrapped in a towel) to outer ear for 20 minutes.  This will also increase drainage.  Avoid ear plugs  Do not use Q-tips  After showers, help the water run out by tilting your head to one side.  GET HELP RIGHT AWAY IF:   Fever is over 102.2 degrees.  You develop progressive ear pain or hearing loss.  Ear symptoms persist longer than 3 days after treatment.  MAKE SURE YOU:   Understand these instructions.  Will watch your condition.  Will get help right away if you are not doing well or get worse.  TO PREVENT SWIMMER'S EAR:  Use a bathing cap or custom fitted swim molds to keep your ears dry.  Towel off after swimming to dry your ears.  Tilt your head or pull your earlobes to allow the water to escape your ear canal.  If there is still water in your ears, consider using a hairdryer on the lowest setting.  Thank you for choosing an e-visit. Your e-visit answers were reviewed by a board certified advanced clinical practitioner to complete your personal care plan. Depending upon the condition, your plan could have included both over the counter or prescription medications. Please review your pharmacy choice. Be sure that the pharmacy you have chosen is open so that you can pick up your prescription now.  If there is a problem you may message your provider in [place] to have the prescription routed to another  pharmacy. Your safety is important to [place]. If you have drug allergies check your prescription carefully.  For the next 24 hours, you can use MyChart to ask questions about today's visit, request a non-urgent call back, or ask for a work or school excuse from your e-visit provider. You will get an email in the next two days asking about your experience. I hope that your e-visit has been valuable and will speed your recovery.  Approximately 5 minutes was spent documenting and reviewing patient's chart.

## 2020-05-02 ENCOUNTER — Other Ambulatory Visit: Payer: Self-pay | Admitting: Internal Medicine

## 2020-05-03 MED ORDER — ZOLPIDEM TARTRATE 10 MG PO TABS: 10.0000 mg | ORAL_TABLET | Freq: Every evening | ORAL | 0 refills | Status: DC | PRN

## 2020-05-03 NOTE — Telephone Encounter (Signed)
Last filled 03/30/2020...Marland Kitchen please advise

## 2020-05-15 ENCOUNTER — Other Ambulatory Visit: Payer: Self-pay

## 2020-05-15 ENCOUNTER — Encounter: Payer: Self-pay | Admitting: Family Medicine

## 2020-05-15 ENCOUNTER — Ambulatory Visit (INDEPENDENT_AMBULATORY_CARE_PROVIDER_SITE_OTHER): Payer: BC Managed Care – PPO | Admitting: Family Medicine

## 2020-05-15 ENCOUNTER — Ambulatory Visit (INDEPENDENT_AMBULATORY_CARE_PROVIDER_SITE_OTHER)
Admission: RE | Admit: 2020-05-15 | Discharge: 2020-05-15 | Disposition: A | Discharge status: Home / Self Care | Payer: BC Managed Care – PPO | Source: Ambulatory Visit | Attending: Family Medicine | Admitting: Family Medicine

## 2020-05-15 VITALS — BP 110/74 | HR 72 | Temp 97.9°F | Ht 67.5 in | Wt 247.8 lb

## 2020-05-15 DIAGNOSIS — M25551 Pain in right hip: Secondary | ICD-10-CM

## 2020-05-15 NOTE — Progress Notes (Signed)
[name], MD, [place] at [facility] [place], [address]  Phone: [phone]  FAX: [place] - 30 y.o. female  MRN [id number]  Date of Birth: 1990/04/29  Date: 05/15/2020  PCP: Jearld Fenton, NP  Referral: Jearld Fenton, NP  Chief Complaint  Patient presents with  . Hip Pain    Right x 1 month    This visit occurred during the SARS-CoV-2 public health emergency.  Safety protocols were in place, including screening questions prior to the visit, additional usage of staff PPE, and extensive cleaning of exam room while observing appropriate contact time as indicated for disinfecting solutions.   Subjective:   Carrie Thompson is a 30 y.o. very pleasant female patient with Body mass index is 38.23 kg/m. who presents with the following:  Anterior groin pain that has been present for about 2 months..  Stretching and lying down will not hurt. Deep pain and an ache and this is always in the anterior groin without pain with muscular movement.   Will ache down her back thigh.  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 No injury  Has lost a lot of weight.  Lost 90 pounds since Jan.  Used to work at [employer] Recently changed jobs to [employer], and she sometimes still does work at [employer] for [employer].  She does not describe any back pain, numbness, or radicular symptoms at all.  She does not have any posterior pelvic pain.  She has no significant knee pain bilaterally.  She has no pain on the left side.  Review of Systems is noted in the HPI, as appropriate   Objective:   BP 110/74   Pulse 72   Temp 97.9 F (36.6 C) (Temporal)   Ht 5' 7.5" (1.715 m)   Wt 247 lb 12 oz (112.4 kg)   SpO2 97%   BMI 38.23 kg/m    Range of motion at  the waist: Flexion: normal Extension: normal Lateral bending: normal Rotation: all normal   HIP EXAM:  SIDE: R ROM: Abduction, Flexion, Internal and External range of motion: Full ROM Pain with terminal IROM and EROM: pain with terminal internal ROM FADIR is positive FABER: NT REVERSE FABER: NT, neg Piriformis: NT at direct palpation Str: flexion: 5/5 abduction: 5/5 adduction: 5/5 Strength testing non-tender in all directions.     No echymosis or edema Rises to examination table with no difficulty Gait: non antalgic  Inspection/Deformity: N Paraspinus Tenderness: None  B Ankle Dorsiflexion (L5,4): 5/5 B Great Toe Dorsiflexion (L5,4): 5/5 Heel Walk (L5): WNL Toe Walk (S1): WNL Rise/Squat (L4): WNL  SENSORY B Medial Foot (L4): WNL B Dorsum (L5): WNL B Lateral (S1): WNL Light Touch: WNL  REFLEXES Knee (L4): 2+ Ankle (S1): 2+  B SLR, seated: neg B SLR, supine: neg B Greater Troch: NT B Log Roll: neg B Sciatic Notch: NT  Radiology: No results found.  Assessment and Plan:     ICD-10-CM   1. Acute hip pain, right  M25.551 DG Hip Unilat W OR W/O Pelvis 2-3 Views Right    Ambulatory referral to Physical Therapy   Anterior groin pain without any associated back pain, muscular weakness.  Check a plain x-ray to rule out any potential osteoarthritic change.  With exam, worried that she may have a labral tear.  We are going to do  physical therapy for now, modification of activity.  If none of this helps with her symptoms, then an MR arthrogram would be a reasonable next step to evaluate for labral pathology.  Social: This is impairing her ability to exercise at a high level.  Orders Placed This Encounter  Procedures  . DG Hip Unilat W OR W/O Pelvis 2-3 Views Right  . Ambulatory referral to Physical Therapy    Follow-up: No follow-ups on file.  Signed,  Maud Deed. Mathius Birkeland, MD   Outpatient Encounter Medications as of 05/15/2020  Medication Sig  . amphetamine-dextroamphetamine (ADDERALL) 30 MG tablet Take 1 tablet by mouth 2 (two) times daily.  . fluticasone  (FLONASE) 50 MCG/ACT nasal spray Place 2 sprays into both nostrils daily.  Marland Kitchen levonorgestrel (MIRENA, 52 MG,) 20 MCG/24HR IUD Mirena 20 mcg/24 hours (7 yrs) 52 mg intrauterine device  Take 1 device by intrauterine route.  Marland Kitchen zolpidem (AMBIEN) 10 MG tablet Take 1 tablet (10 mg total) by mouth at bedtime as needed for sleep.  . [DISCONTINUED] amoxicillin-clavulanate (AUGMENTIN) 875-125 MG tablet Take 1 tablet by mouth 2 (two) times daily.  . [DISCONTINUED] brexpiprazole (REXULTI) 2 MG TABS tablet Take 1 tablet (2 mg total) by mouth daily.  . [DISCONTINUED] FLUoxetine (PROZAC) 40 MG capsule Take 1 capsule (40 mg total) by mouth daily.  . [DISCONTINUED] ondansetron (ZOFRAN-ODT) 4 MG disintegrating tablet Take 1 tablet (4 mg total) by mouth every 6 (six) hours as needed for nausea or vomiting.  . [DISCONTINUED] pantoprazole (PROTONIX) 40 MG tablet Take 1 tablet (40 mg total) by mouth daily.  . [DISCONTINUED] traMADol (ULTRAM) 50 MG tablet Take 1 tablet (50 mg total) by mouth every 6 (six) hours as needed (pain).   No facility-administered encounter medications on file as of 05/15/2020.

## 2020-05-29 DIAGNOSIS — M6281 Muscle weakness (generalized): Secondary | ICD-10-CM | POA: Diagnosis not present

## 2020-05-29 DIAGNOSIS — M799 Soft tissue disorder, unspecified: Secondary | ICD-10-CM | POA: Diagnosis not present

## 2020-05-29 DIAGNOSIS — M25551 Pain in right hip: Secondary | ICD-10-CM | POA: Diagnosis not present

## 2020-06-04 ENCOUNTER — Other Ambulatory Visit: Payer: Self-pay | Admitting: Internal Medicine

## 2020-06-04 NOTE — Telephone Encounter (Signed)
Last filled 05/03/2020... please advise

## 2020-06-05 DIAGNOSIS — M6281 Muscle weakness (generalized): Secondary | ICD-10-CM | POA: Diagnosis not present

## 2020-06-05 DIAGNOSIS — M799 Soft tissue disorder, unspecified: Secondary | ICD-10-CM | POA: Diagnosis not present

## 2020-06-05 DIAGNOSIS — M25551 Pain in right hip: Secondary | ICD-10-CM | POA: Diagnosis not present

## 2020-06-05 MED ORDER — ZOLPIDEM TARTRATE 10 MG PO TABS: 10.0000 mg | ORAL_TABLET | Freq: Every evening | ORAL | 0 refills | Status: DC | PRN

## 2020-06-06 DIAGNOSIS — M6281 Muscle weakness (generalized): Secondary | ICD-10-CM | POA: Diagnosis not present

## 2020-06-06 DIAGNOSIS — M799 Soft tissue disorder, unspecified: Secondary | ICD-10-CM | POA: Diagnosis not present

## 2020-06-06 DIAGNOSIS — M25551 Pain in right hip: Secondary | ICD-10-CM | POA: Diagnosis not present

## 2020-06-11 DIAGNOSIS — M6281 Muscle weakness (generalized): Secondary | ICD-10-CM | POA: Diagnosis not present

## 2020-06-11 DIAGNOSIS — M799 Soft tissue disorder, unspecified: Secondary | ICD-10-CM | POA: Diagnosis not present

## 2020-06-11 DIAGNOSIS — M25551 Pain in right hip: Secondary | ICD-10-CM | POA: Diagnosis not present

## 2020-06-18 DIAGNOSIS — M6281 Muscle weakness (generalized): Secondary | ICD-10-CM | POA: Diagnosis not present

## 2020-06-18 DIAGNOSIS — M799 Soft tissue disorder, unspecified: Secondary | ICD-10-CM | POA: Diagnosis not present

## 2020-06-18 DIAGNOSIS — M25551 Pain in right hip: Secondary | ICD-10-CM | POA: Diagnosis not present

## 2020-06-28 DIAGNOSIS — M25551 Pain in right hip: Secondary | ICD-10-CM | POA: Diagnosis not present

## 2020-06-28 DIAGNOSIS — M799 Soft tissue disorder, unspecified: Secondary | ICD-10-CM | POA: Diagnosis not present

## 2020-06-28 DIAGNOSIS — M6281 Muscle weakness (generalized): Secondary | ICD-10-CM | POA: Diagnosis not present

## 2020-07-02 DIAGNOSIS — M799 Soft tissue disorder, unspecified: Secondary | ICD-10-CM | POA: Diagnosis not present

## 2020-07-02 DIAGNOSIS — M6281 Muscle weakness (generalized): Secondary | ICD-10-CM | POA: Diagnosis not present

## 2020-07-02 DIAGNOSIS — M25551 Pain in right hip: Secondary | ICD-10-CM | POA: Diagnosis not present

## 2020-07-03 ENCOUNTER — Other Ambulatory Visit: Payer: Self-pay | Admitting: Internal Medicine

## 2020-07-03 NOTE — Telephone Encounter (Signed)
Last filled 06/05/2020... please advise

## 2020-07-04 MED ORDER — ZOLPIDEM TARTRATE 10 MG PO TABS: 10.0000 mg | ORAL_TABLET | Freq: Every evening | ORAL | 0 refills | Status: DC | PRN

## 2020-07-09 DIAGNOSIS — M799 Soft tissue disorder, unspecified: Secondary | ICD-10-CM | POA: Diagnosis not present

## 2020-07-09 DIAGNOSIS — M6281 Muscle weakness (generalized): Secondary | ICD-10-CM | POA: Diagnosis not present

## 2020-07-09 DIAGNOSIS — M25551 Pain in right hip: Secondary | ICD-10-CM | POA: Diagnosis not present

## 2020-07-10 ENCOUNTER — Ambulatory Visit: Payer: BC Managed Care – PPO | Admitting: Family Medicine

## 2020-07-17 ENCOUNTER — Encounter: Payer: Self-pay | Admitting: Family Medicine

## 2020-07-17 ENCOUNTER — Ambulatory Visit (INDEPENDENT_AMBULATORY_CARE_PROVIDER_SITE_OTHER): Payer: BC Managed Care – PPO | Admitting: Family Medicine

## 2020-07-17 ENCOUNTER — Other Ambulatory Visit: Payer: Self-pay

## 2020-07-17 VITALS — BP 110/80 | HR 68 | Temp 98.0°F | Ht 67.5 in | Wt 241.0 lb

## 2020-07-17 DIAGNOSIS — S73191A Other sprain of right hip, initial encounter: Secondary | ICD-10-CM

## 2020-07-17 DIAGNOSIS — M25551 Pain in right hip: Secondary | ICD-10-CM

## 2020-07-17 DIAGNOSIS — Z903 Acquired absence of stomach [part of]: Secondary | ICD-10-CM | POA: Insufficient documentation

## 2020-07-17 NOTE — Progress Notes (Addendum)
Erin Uecker T. Avid Guillette, MD, CAQ Sports Medicine  Primary Care and Sports Medicine [facility] [address] [place], [address]  Phone: [phone]  FAX: [phone]  MIARA EMMINGER - 30 y.o. female  MRN [id number]  Date of Birth: [birth year]  Date: 07/17/2020  PCP: Lorre Munroe, NP  Referral: Lorre Munroe, NP  Chief Complaint  Patient presents with  . Follow-up    Right Hip Pain after PT    This visit occurred during the SARS-CoV-2 public health emergency.  Safety protocols were in place, including screening questions prior to the visit, additional usage of staff PPE, and extensive cleaning of exam room while observing appropriate contact time as indicated for disinfecting solutions.   Subjective:   Carrie Thompson is a 30 y.o. very pleasant female patient with Body mass index is 37.19 kg/m. who presents with the following:  F/u after PT for her R hip pain, anterior.  This initially started in early January.  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  She has not had any form of traumatic injury.  Her pain has not really changed including after a 6-week course of physical therapy.  Rotational movements at the hip have been persistent and are quite painful.  She does not describe any numbness, tingling, radicular pain or back pain.  She does not have any lateral hip pain and no posterior pelvic pain.  Plain x-rays are again reviewed by myself and there is no evidence of any significant degenerative joint disease.  She has had a history of a gastric sleeve, she is unable to take oral anti-inflammatories.  05/15/2020 Last OV with Hannah Beat, MD  Anterior groin pain that has been present for about 2 months..  Stretching and lying down will not hurt. Deep pain and an ache and this is always in the anterior groin without pain with muscular movement.   Will ache down her back  thigh.  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 No injury  Has lost a lot of weight.  Lost 90 pounds since Jan.  Used to work at [employer] Recently changed jobs to [employer], and she sometimes still does work at [employer] for [employer].  She does not describe any back pain, numbness, or radicular symptoms at all.  She does not have any posterior pelvic pain.  She has no significant knee pain bilaterally.  She has no pain on the left side.  Review of Systems is noted in the HPI, as appropriate   Patient Active Problem List   Diagnosis Date Noted  . 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 Morbid obesity (HCC) 08/15/2019  . Obesity 06/20/2019  . Abnormal cervical Papanicolaou smear 05/24/2019  . ADD (attention deficit disorder) 01/06/2016  . Childhood asthma 10/10/2014  . Anxiety and depression 10/10/2014  . Pseudotumor cerebri 10/10/2014    Past Medical History:  Diagnosis Date  . Abnormal Pap smear   . Anxiety   . Chicken pox   . Depression   . Hx of varicella   . Seizures (HCC)    hx pseudotumor treated in 2011 with meds, never had seizure  . Vaginal Pap smear, abnormal     Past Surgical History:  Procedure Laterality Date  . LAPAROSCOPIC GASTRIC SLEEVE RESECTION N/A 08/15/2019   Procedure: LAPAROSCOPIC GASTRIC SLEEVE RESECTION, Upper Endo, ERAS Pathway;  Surgeon: Berna Bue, MD;  Location:  [facility];  Service: General;  Laterality: N/A;    Family History  Problem Relation Age of Onset  . Other Mother        varicose veins  . Varicose Veins Mother   . Mental illness Mother   . Diabetes Father   . Mental illness Father   . Rheum arthritis Maternal Grandmother   . Hypertension Maternal Grandfather      Objective:   BP 110/80   Pulse 68   Temp 98 F (36.7 C) (Temporal)   Ht 5' 7.5" (1.715 m)   Wt 241 lb (109.3 kg)   SpO2 97%   BMI 37.19 kg/m    Range of motion at  the waist: Flexion: normal Extension: normal Lateral bending: normal Rotation:  all normal  No echymosis or edema Rises to examination table with no difficulty Gait: non antalgic  Inspection/Deformity: N Paraspinus Tenderness: none  B Ankle Dorsiflexion (L5,4): 5/5 B Great Toe Dorsiflexion (L5,4): 5/5 Heel Walk (L5): WNL Toe Walk (S1): WNL Rise/Squat (L4): WNL  SENSORY B Medial Foot (L4): WNL B Dorsum (L5): WNL B Lateral (S1): WNL Light Touch: WNL Pinprick: WNL  B SLR, seated: neg B SLR, supine: neg B Greater Troch: NT B Log Roll: neg B Sciatic Notch: NT   HIP EXAM: SIDE: R ROM: Abduction, Flexion, Internal and External range of motion: Appropriate and normal for age  Pain with terminal IROM and EROM: With the hip flexed to 90 degrees the patient has pain with terminal internal and external range of motion, quite painful, and worse with internal range of motion with an anterior groin pain.  FADIR is positive  GTB: NT SLR: NEG Knees: No effusion REVERSE FABER: NT, neg Piriformis: NT at direct palpation Str: flexion: 5/5 abduction: 5/5 adduction: 5/5 Strength testing non-tender     Radiology: DG Hip Unilat W OR W/O Pelvis 2-3 Views Right  Result Date: 05/16/2020 CLINICAL DATA:  Pain EXAM: DG HIP (WITH OR WITHOUT PELVIS) 2-3V RIGHT COMPARISON:  None. FINDINGS: Weightbearing frontal pelvis as well as weightbearing frontal and lateral right hip joints images obtained. No fracture or dislocation. Joint spaces appear normal. No erosive change. There is an intrauterine device in the mid-pelvis. IMPRESSION: No fracture or dislocation. No evident arthropathy. Intrauterine device in mid pelvis. Electronically Signed   By: Lowella Grip III M.D.   On: 05/16/2020 10:52   Assessment and Plan:     ICD-10-CM   1. Acute hip pain, right  M25.551 MR HIP RIGHT W CONTRAST    DG FLUORO GUIDED NEEDLE PLC ASPIRATION/INJECTION LOC    Ambulatory referral to Orthopedic Surgery  2. H/O gastric sleeve  Z90.3   3. Tear of right acetabular labrum, initial  encounter  S73.191A Ambulatory referral to Orthopedic Surgery   4 months of right-sided anterior hip pain without improvement despite extensive rehab, physical therapy x6 weeks, and no improvement despite PT.  Oral NSAIDs are contraindicated secondary to gastric sleeve.  Tylenol and topicals have not helped at all.  With positive FADIR and generalized pain with rotational movement at the hip flexed to 90 degrees, clinical concern for acetabular labral tear.  Obtain an MR arthrogram of the right hip to evaluate for acetabular labral tear and femoral acetabular impingement.  Addendum: 08/21/20 10:41 AM She has a large labral tear of her R hip, MR Arthrogram proven.  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  MR HIP RIGHT W CONTRAST  Result Date:  08/16/2020 CLINICAL DATA:  Chronic right hip pain EXAM: MRI OF THE RIGHT HIP WITH CONTRAST (MR Arthrogram) TECHNIQUE: Multiplanar, multisequence MR imaging of the hip was performed immediately following contrast injection into the hip joint under fluoroscopic guidance. No intravenous contrast was administered. COMPARISON:  X-ray 05/15/2020 FINDINGS: Bones: No acute fracture. No dislocation. No femoral head avascular necrosis. Normal offset of the femoral head-neck junction. Increased T2 signal within the subchondral bone of the parasymphyseal aspects of the bilateral pubic bones (series 7, image 23) pubic symphysis intact without diastasis. No subchondral fracture. SI joints appear intact and unremarkable. No marrow replacing bone lesion. Articular cartilage and labrum Articular cartilage: No chondral defect. No subchondral marrow signal changes. Labrum: High-grade tearing of the superior labrum including full-thickness component at the anterosuperior labrum (series 10, images 21-23; series 8, image 13). Lobulated T2 hyperintense structure along the lateral margin of the superior acetabulum measuring 1.7 x 0.9 x 1.5 cm, likely  paralabral cyst (series 6, images 15-16). Joint or bursal effusion Joint effusion: Joint is well distended with injected contrast. Bursae: No abnormal bursal fluid collection. Muscles and tendons Muscles and tendons: The gluteal, hamstring, iliopsoas, rectus femoris, and adductor tendons appear intact without tear or significant tendinosis. Normal muscle bulk and signal intensity without edema, atrophy, or fatty infiltration. Other findings Miscellaneous: No inguinal lymphadenopathy. IUD is noted within the endometrial canal. No acute findings within the pelvis. IMPRESSION: 1. High-grade tearing of the superior labrum with adjacent 1.7 cm paralabral cyst. 2. No cartilage abnormality of the right hip. 3. 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 Electronically Signed   By: Davina Poke D.O.   On: 08/16/2020 16:54   DG FLUORO GUIDED NEEDLE PLC ASPIRATION/INJECTION LOC  Result Date: 08/15/2020 CLINICAL DATA:  30 year old female with right hip pain. Pain began following a twisting injury while hitting golf balls. EXAM: HIP INJECTION FOR MRI FLUOROSCOPY TIME:  1 minutes 31 seconds 13.6 mGy PROCEDURE: After a thorough discussion of risks and benefits of the procedure, written and oral informed consent was obtained. The consent discussion included off label use of Multihance, the risk of bleeding, infection and injury to nerves and adjacent blood vessels. Extra-articular injection was also a possible risk discussed. Preliminary localization was performed over the right hip. The area was marked over the junction of the left femoral head and neck. After prep and drape in the usual sterile fashion, the skin and deeper subcutaneous tissues were anesthetized with 1% Lidocaine without Epinephrine. Under fluoroscopic guidance, a 22 gauge 3.5 inch spinal needle was advanced into the joint at the lateral margin of the junction of the  femoral head and neck. Intra-articular injection of Lidocaine was performed which flowed freely and subsequently the joint was distended with 15 ml of a 1:200 dilution of Multihance contrast. The MR arthrogram solution was as follows: 15 ml of omnipaque 180 contrast agent, 0.1 mL Multihance, 5 ml of 1% Lidocaine. An end point was felt as well as the patient experiencing pressure and the injection was discontinued, the needle removed, and a sterile dressing applied. The patient was taken to MRI for subsequent imaging. The patient tolerated the procedure well and there were no complications. IMPRESSION: Successful right hip fluoroscopically guided injection. Electronically Signed   By: Jacqulynn Cadet M.D.   On: 08/15/2020 16:40     Orders Placed This Encounter  Procedures  . MR HIP RIGHT W CONTRAST  . DG FLUORO GUIDED NEEDLE  PLC ASPIRATION/INJECTION LOC  . Ambulatory referral to Orthopedic Surgery    Follow-up: Will depend upon advanced imaging  Signed,  Kerigan Narvaez T. Kane Kusek, MD   Outpatient Encounter Medications as of 07/17/2020  Medication Sig  . amphetamine-dextroamphetamine (ADDERALL) 30 MG tablet Take 1 tablet by mouth 2 (two) times daily.  . fluticasone (FLONASE) 50 MCG/ACT nasal spray Place 2 sprays into both nostrils daily.  Marland Kitchen levonorgestrel (MIRENA, 52 MG,) 20 MCG/24HR IUD Mirena 20 mcg/24 hours (7 yrs) 52 mg intrauterine device  Take 1 device by intrauterine route.  . [DISCONTINUED] zolpidem (AMBIEN) 10 MG tablet Take 1 tablet (10 mg total) by mouth at bedtime as needed for sleep.   No facility-administered encounter medications on file as of 07/17/2020.

## 2020-08-01 ENCOUNTER — Other Ambulatory Visit: Payer: Self-pay | Admitting: Internal Medicine

## 2020-08-02 MED ORDER — ZOLPIDEM TARTRATE 10 MG PO TABS: 10.0000 mg | ORAL_TABLET | Freq: Every evening | ORAL | 0 refills | Status: DC | PRN

## 2020-08-02 NOTE — Telephone Encounter (Signed)
Pt states she is moving with you.... last filled 07/04/2020...Marland Kitchen please advise

## 2020-08-06 ENCOUNTER — Encounter: Payer: BC Managed Care – PPO | Attending: Surgery | Admitting: Skilled Nursing Facility1

## 2020-08-06 ENCOUNTER — Other Ambulatory Visit: Payer: Self-pay

## 2020-08-06 DIAGNOSIS — E669 Obesity, unspecified: Secondary | ICD-10-CM | POA: Diagnosis not present

## 2020-08-06 NOTE — Progress Notes (Signed)
Bariatric Nutrition Follow-Up Visit Medical Nutrition Therapy  Appt Start Time: 3:20pm    End Time: 3:50pm  2 Months Post-Operative Sleeve Surgery Surgery Date: 08/15/2019  Pt's Expectations of Surgery/ Goals: to be healthier, feel better, have more energy; would like to weigh ~190 to 200 lbs Pt Reported Successes: clothes fitting more loosely    NUTRITION ASSESSMENT  Anthropometrics  Start weight at [facility]: 329.5 lbs (date: 06/20/2019) Today's weight: 237.4 lbs  Body Composition Scale 08/29/2019 10/10/2019 08/06/2020  Weight  lbs 314.2 299.6 237.4  BMI 47.8 45.6   Total Body Fat  % 47.3 46.3 40.9     Visceral Fat 14 13   Fat-Free Mass  % 52.6 53.6      Total Body Water  % 40.8 41.3 44     Muscle-Mass  lbs 36.8 36.7   Body Fat Displacement --- ---          Torso  lbs 92.4 86.1          Left Leg  lbs 18.4 17.2          Right Leg  lbs 18.4 17.2          Left Arm  lbs 9.2 8.6          Right Arm  lbs 9.2 8.6     Lifestyle & Dietary Hx   Pt state she quit her job to take care of her mom and grandma and will be going back to work in the fall. Pt states she takes Ambien to help her sleep no longer needing naps. Pt states she is no longer having any discomfort with swallowing. Pt states she does need to burp when she eats too fast. Pt states she works at [employer]. Pt states she wakes starving first thing in the morning. Pt states she ripped her labrum in her hip so cannot exercise like she wants to which has been disappointing. Pt states she will eat her chicken instead of any non starchy vegetables because she likes that better.   Pt states she really came in today because she fears her weight loss has slowed and really wants to reach 100 pounds loss by one year: Dietitian counseled pt on turing her focus to correcting her hip and trying to do right by her body regardless of what her weight is doing.   24-Hr Dietary Recall First Meal: 1 Starbucks egg bite or makes her own egg bites  (cheese and breakfast meat sometimes spinach Snack: 1 egg bite Second Meal 11: gluten free pizza (leftovers from dinner) Snack: sometimes cheese + rice cake  Third Meal: chicken + baby potatoes + green beans Snack:  Beverages: Starbucks cold brew w/ skim milk & sugar-free syrup, water, Crystal Light, gold peak diet tea  Estimated daily fluid intake: 64+ oz Estimated daily protein intake: 60 g Supplements: bariatric MVI (advsied to send an email of the back of it) Current average weekly physical activity:   Post-Op Goals/ Signs/ Symptoms Using straws: yes Drinking while eating: no Chewing/swallowing difficulties: no Changes in vision: no Changes to mood/headaches: no Hair loss/changes to skin/nails: no Difficulty focusing/concentrating: no Sweating: no Dizziness/lightheadedness: no Palpitations: no  Carbonated/caffeinated beverages: yes (coffee)  N/V/D/C/Gas: no Abdominal pain: no Dumping syndrome: no   NUTRITION DIAGNOSIS  Overweight/obesity ([place]-3.3) related to past poor dietary habits and physical inactivity as evidenced by completed bariatric surgery and following dietary guidelines for continued weight loss and healthy nutrition status.   NUTRITION INTERVENTION Nutrition counseling (C-1) and education (  E-2) to facilitate bariatric surgery goals, including: . The importance of consuming adequate calories as well as certain nutrients daily due to the body's need for essential vitamins, minerals, and fats . The importance of daily physical activity and to reach a goal of at least 150 minutes of moderate to vigorous physical activity weekly (or as directed by their physician) due to benefits such as increased musculature and improved lab values Importance of vegetables . To have an overall healthy diet, adult men and women are recommended to consume anywhere from 2-3 cups of vegetables daily. Vegetables provide a wide range of vitamins and minerals such as vitamin A, vitamin C,  potassium, and folic acid. According to the [employer], including fruit and vegetables daily may reduce the risk of cardiovascular disease, certain cancers, and other non-communicable diseases. Encouraged patient to honor their body's internal hunger and fullness cues.  Throughout the day, check in mentally and rate hunger. Stop eating when satisfied not full regardless of how much food is left on the plate.  Get more if still hungry 20-30 minutes later.  The key is to honor satisfaction so throughout the meal, rate fullness factor and stop when comfortably satisfied not physically full. The key is to honor hunger and fullness without any feelings of guilt or shame.  Pay attention to what the internal cues are, rather than any external factors. This will enhance the confidence you have in listening to your own body and following those internal cues enabling you to increase how often you eat when you are hungry not out of appetite and stop when you are satisfied not full.  Encouraged pt to continue to eat balanced meals inclusive of non starchy vegetables 2 times a day 7 days a week Encouraged pt to choose lean protein sources: limiting beef, pork, sausage, hotdogs, and lunch meat Encourage pt to choose healthy fats such as plant based limiting animal fats . Encouraged pt to continue to drink a minium 64 fluid ounces with half being plain water to satisfy proper hydration   Goals: -Aim to take at least 1 calcium every day  -Ensure you do not sacrifice vegetables for want of more protein  -send an image of your multivitamin   Handouts Provided Include     Learning Style & Readiness for Change Teaching method utilized: Visual & Auditory  Demonstrated degree of understanding via: Teach Back  Barriers to learning/adherence to lifestyle change: None Identified    MONITORING & EVALUATION Dietary intake, weekly physical activity, body weight  Next Steps Patient is to follow-up  As  needed

## 2020-08-15 ENCOUNTER — Other Ambulatory Visit: Payer: Self-pay

## 2020-08-15 ENCOUNTER — Ambulatory Visit
Admission: RE | Admit: 2020-08-15 | Discharge: 2020-08-15 | Disposition: A | Discharge status: Home / Self Care | Payer: BC Managed Care – PPO | Source: Ambulatory Visit | Attending: Family Medicine | Admitting: Family Medicine

## 2020-08-15 DIAGNOSIS — M25551 Pain in right hip: Secondary | ICD-10-CM

## 2020-08-15 DIAGNOSIS — S73191A Other sprain of right hip, initial encounter: Secondary | ICD-10-CM | POA: Diagnosis not present

## 2020-08-15 MED ORDER — IOPAMIDOL (ISOVUE-M 200) INJECTION 41%
15.0000 mL | Freq: Once | INTRAMUSCULAR | Status: AC
  Administered 2020-08-15: 15 mL via INTRA_ARTICULAR

## 2020-08-21 NOTE — Addendum Note (Signed)
Addended by: Owens Loffler on: 08/21/2020 10:41 AM   Modules accepted: Orders

## 2020-08-29 ENCOUNTER — Other Ambulatory Visit: Payer: Self-pay | Admitting: Internal Medicine

## 2020-08-29 MED ORDER — ZOLPIDEM TARTRATE 10 MG PO TABS: 10.0000 mg | ORAL_TABLET | Freq: Every evening | ORAL | 0 refills | Status: DC | PRN

## 2020-08-29 NOTE — Telephone Encounter (Signed)
Last office visit 07/17/2020 with Dr. Lorelei Pont for Right hip pain.  Last refilled 08/02/2020 for #30 with no refills.  No TOC appointment.

## 2020-09-02 DIAGNOSIS — M25551 Pain in right hip: Secondary | ICD-10-CM | POA: Diagnosis not present

## 2020-09-02 DIAGNOSIS — M76891 Other specified enthesopathies of right lower limb, excluding foot: Secondary | ICD-10-CM | POA: Diagnosis not present

## 2020-09-04 DIAGNOSIS — M76891 Other specified enthesopathies of right lower limb, excluding foot: Secondary | ICD-10-CM | POA: Insufficient documentation

## 2020-10-04 ENCOUNTER — Encounter: Payer: Self-pay | Admitting: Internal Medicine

## 2020-10-04 MED ORDER — ZOLPIDEM TARTRATE 10 MG PO TABS: 10.0000 mg | ORAL_TABLET | Freq: Every evening | ORAL | 0 refills | Status: DC | PRN

## 2020-10-07 ENCOUNTER — Other Ambulatory Visit: Payer: Self-pay

## 2020-10-07 ENCOUNTER — Ambulatory Visit (INDEPENDENT_AMBULATORY_CARE_PROVIDER_SITE_OTHER): Payer: BC Managed Care – PPO | Admitting: Internal Medicine

## 2020-10-07 ENCOUNTER — Encounter: Payer: Self-pay | Admitting: Internal Medicine

## 2020-10-07 VITALS — BP 116/72 | HR 64 | Temp 97.1°F | Resp 17 | Ht 67.0 in | Wt 229.4 lb

## 2020-10-07 DIAGNOSIS — F5101 Primary insomnia: Secondary | ICD-10-CM | POA: Diagnosis not present

## 2020-10-07 DIAGNOSIS — E6609 Other obesity due to excess calories: Secondary | ICD-10-CM

## 2020-10-07 DIAGNOSIS — F988 Other specified behavioral and emotional disorders with onset usually occurring in childhood and adolescence: Secondary | ICD-10-CM

## 2020-10-07 DIAGNOSIS — F419 Anxiety disorder, unspecified: Secondary | ICD-10-CM

## 2020-10-07 DIAGNOSIS — G4700 Insomnia, unspecified: Secondary | ICD-10-CM | POA: Insufficient documentation

## 2020-10-07 DIAGNOSIS — J4520 Mild intermittent asthma, uncomplicated: Secondary | ICD-10-CM

## 2020-10-07 DIAGNOSIS — E66811 Obesity, class 1: Secondary | ICD-10-CM | POA: Insufficient documentation

## 2020-10-07 DIAGNOSIS — Z6835 Body mass index (BMI) 35.0-35.9, adult: Secondary | ICD-10-CM

## 2020-10-07 DIAGNOSIS — G932 Benign intracranial hypertension: Secondary | ICD-10-CM | POA: Diagnosis not present

## 2020-10-07 DIAGNOSIS — F32A Depression, unspecified: Secondary | ICD-10-CM

## 2020-10-07 MED ORDER — EPINEPHRINE 0.3 MG/0.3ML IJ SOAJ: 0.3000 mg | INTRAMUSCULAR | 0 refills | Status: DC | PRN

## 2020-10-07 NOTE — Assessment & Plan Note (Signed)
Continue Ambien nightly

## 2020-10-07 NOTE — Assessment & Plan Note (Signed)
No issues off meds °Will monitor °

## 2020-10-07 NOTE — Assessment & Plan Note (Signed)
S/p gastric sleeve Encouraged continue diet and exercise for weight loss

## 2020-10-07 NOTE — Assessment & Plan Note (Signed)
Currently not an issue Will monitor 

## 2020-10-07 NOTE — Assessment & Plan Note (Signed)
Stable off meds °Will monitor °

## 2020-10-07 NOTE — Patient Instructions (Signed)
Insomnia Insomnia is a sleep disorder that makes it difficult to fall asleep or stay asleep. Insomnia can cause fatigue, low energy, difficulty concentrating, moodswings, and poor performance at work or school. There are three different ways to classify insomnia: Difficulty falling asleep. Difficulty staying asleep. Waking up too early in the morning. Any type of insomnia can be long-term (chronic) or short-term (acute). Both are common. Short-term insomnia usually lasts for three months or less. Chronic insomnia occurs at least three times a week for longer than threemonths. What are the causes? Insomnia may be caused by another condition, situation, or substance, such as: Anxiety. Certain medicines. Gastroesophageal reflux disease (GERD) or other gastrointestinal conditions. Asthma or other breathing conditions. Restless legs syndrome, sleep apnea, or other sleep disorders. Chronic pain. Menopause. Stroke. Abuse of alcohol, tobacco, or illegal drugs. Mental health conditions, such as depression. Caffeine. Neurological disorders, such as Alzheimer's disease. An overactive thyroid (hyperthyroidism). Sometimes, the cause of insomnia may not be known. What increases the risk? Risk factors for insomnia include: Gender. Women are affected more often than men. Age. Insomnia is more common as you get older. Stress. Lack of exercise. Irregular work schedule or working night shifts. Traveling between different time zones. Certain medical and mental health conditions. What are the signs or symptoms? If you have insomnia, the main symptom is having trouble falling asleep or having trouble staying asleep. This may lead to other symptoms, such as: Feeling fatigued or having low energy. Feeling nervous about going to sleep. Not feeling rested in the morning. Having trouble concentrating. Feeling irritable, anxious, or depressed. How is this diagnosed? This condition may be diagnosed based  on: Your symptoms and medical history. Your health care provider may ask about: Your sleep habits. Any medical conditions you have. Your mental health. A physical exam. How is this treated? Treatment for insomnia depends on the cause. Treatment may focus on treating an underlying condition that is causing insomnia. Treatment may also include: Medicines to help you sleep. Counseling or therapy. Lifestyle adjustments to help you sleep better. Follow these instructions at home: Eating and drinking  Limit or avoid alcohol, caffeinated beverages, and cigarettes, especially close to bedtime. These can disrupt your sleep. Do not eat a large meal or eat spicy foods right before bedtime. This can lead to digestive discomfort that can make it hard for you to sleep.  Sleep habits  Keep a sleep diary to help you and your health care provider figure out what could be causing your insomnia. Write down: When you sleep. When you wake up during the night. How well you sleep. How rested you feel the next day. Any side effects of medicines you are taking. What you eat and drink. Make your bedroom a dark, comfortable place where it is easy to fall asleep. Put up shades or blackout curtains to block light from outside. Use a white noise machine to block noise. Keep the temperature cool. Limit screen use before bedtime. This includes: Watching TV. Using your smartphone, tablet, or computer. Stick to a routine that includes going to bed and waking up at the same times every day and night. This can help you fall asleep faster. Consider making a quiet activity, such as reading, part of your nighttime routine. Try to avoid taking naps during the day so that you sleep better at night. Get out of bed if you are still awake after 15 minutes of trying to sleep. Keep the lights down, but try reading or doing a quiet   activity. When you feel sleepy, go back to bed.  General instructions Take over-the-counter  and prescription medicines only as told by your health care provider. Exercise regularly, as told by your health care provider. Avoid exercise starting several hours before bedtime. Use relaxation techniques to manage stress. Ask your health care provider to suggest some techniques that may work well for you. These may include: Breathing exercises. Routines to release muscle tension. Visualizing peaceful scenes. Make sure that you drive carefully. Avoid driving if you feel very sleepy. Keep all follow-up visits as told by your health care provider. This is important. Contact a health care provider if: You are tired throughout the day. You have trouble in your daily routine due to sleepiness. You continue to have sleep problems, or your sleep problems get worse. Get help right away if: You have serious thoughts about hurting yourself or someone else. If you ever feel like you may hurt yourself or others, or have thoughts about taking your own life, get help right away. You can go to your nearest emergency department or call: Your local emergency services (911 in the [place] A suicide crisis helpline, such as the National Suicide Prevention Lifeline at 1-800-273-8255. This is open 24 hours a day. Summary Insomnia is a sleep disorder that makes it difficult to fall asleep or stay asleep. Insomnia can be long-term (chronic) or short-term (acute). Treatment for insomnia depends on the cause. Treatment may focus on treating an underlying condition that is causing insomnia. Keep a sleep diary to help you and your health care provider figure out what could be causing your insomnia. This information is not intended to replace advice given to you by your health care provider. Make sure you discuss any questions you have with your healthcare provider. Document Revised: 01/18/2020 Document Reviewed: 01/18/2020 Elsevier Patient Education  2022 [employer]  

## 2020-10-07 NOTE — Assessment & Plan Note (Signed)
Continue Adderall as needed 

## 2020-10-07 NOTE — Progress Notes (Signed)
Subjective:    Patient ID: Carrie Thompson, female    DOB: 05-26-1990, 30 y.o.   MRN: [id number]  HPI  Pt presents to the clinic today for follow up of chronic conditions.  Asthma: As a child.  She denies chronic cough or shortness of breath.  She is not using any inhalers.  There are no PFTs on file.  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  She is not taking any medications for this.  She is not following with neurology.  Anxiety and Depression: In remission, off meds.  She is not currently seeing a therapist.  She denies SI/HI.  ADD: She takes Adderall only as needed.  She is currently only working part-time.  Insomnia: She has trouble falling and staying asleep. She takes Ambien nightly with good relief of symptoms. She has never had a sleep study.  Chronic Hip Pain: X-ray from 04/2020 and MRI from 07/2020 reviewed.  She follows up with ortho for a 2nd opinion the July 29th.  She is not currently taking any medications for this.  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 60 tablet 0   EPINEPHrine (AUVI-Q) 0.3 mg/0.3 mL IJ SOAJ injection Inject 0.3 mg into the muscle as needed for anaphylaxis.     fluticasone (FLONASE) 50 MCG/ACT nasal spray Place 2 sprays into both nostrils daily. 16 g 6   levonorgestrel (MIRENA, 52 MG,) 20 MCG/24HR IUD Mirena 20 mcg/24 hours (7 yrs) 52 mg intrauterine device  Take 1 device by intrauterine route.     zolpidem (AMBIEN) 10 MG tablet Take 1 tablet (10 mg total) by mouth at bedtime as needed for sleep. 30 tablet 0   No current facility-administered  medications for this visit.    Allergies  Allergen Reactions   Shellfish Allergy Anaphylaxis   Iodine Other (See Comments)    Pt states that she avoids iodine because of her reaction to shellfish.      Family History  Problem Relation Age of Onset   Other Mother        varicose veins   Varicose Veins Mother    Mental illness Mother    Diabetes Father    Mental illness Father    Rheum arthritis Maternal Grandmother    Hypertension Maternal Grandfather     Social History   Socioeconomic History   Marital status: Married    Spouse name: Not on file   Number of children: Not on file   Years of education: Not on file   Highest education level: Not on file  Occupational History   Not on file  Tobacco Use   Smoking status: Never   Smokeless tobacco: Never  Vaping Use   Vaping Use: Never used  Substance and Sexual Activity   Alcohol use: No    Alcohol/week: 0.0 standard drinks   Drug use: No   Sexual activity: Yes    Birth control/protection: I.U.D.  Other Topics Concern   Not on file  Social History Narrative   Not  on file   Social Determinants of Health   Financial Resource Strain: Not on file  Food Insecurity: Not on file  Transportation Needs: Not on file  Physical Activity: Not on file  Stress: Not on file  Social Connections: Not on file  Intimate Partner Violence: Not on file     Constitutional: Denies fever, malaise, fatigue, headache or abrupt weight changes.  HEENT: Denies eye pain, eye redness, ear pain, ringing in the ears, wax buildup, runny nose, nasal congestion, bloody nose, or sore throat. Respiratory: Denies difficulty breathing, shortness of breath, cough or sputum production.   Cardiovascular: Denies chest pain, chest tightness, palpitations or swelling in the hands or feet.  Gastrointestinal: Denies abdominal pain, bloating, constipation, diarrhea or blood in the stool.  GU: Denies urgency, frequency, pain with urination, burning  sensation, blood in urine, odor or discharge. Musculoskeletal: Pt reports right hip pain. Denies decrease in range of motion, difficulty with gait, muscle pain or joint swelling.  Skin: Denies redness, rashes, lesions or ulcercations.  Neurological: Pt reports inattention. Denies dizziness, difficulty with memory, difficulty with speech or problems with balance and coordination.  Psych: Pt has a history of anxiety and depression. Denies SI/HI.  No other specific complaints in a complete review of systems (except as listed in HPI above).  Objective:   Physical Exam   BP 116/72 (BP Location: Right Arm, Patient Position: Sitting, Cuff Size: Large)   Pulse 64   Temp (!) 97.1 F (36.2 C) (Temporal)   Resp 17   Ht 5\' 7"  (1.702 m)   Wt 229 lb 6.4 oz (104.1 kg)   SpO2 100%   BMI 35.93 kg/m  Wt Readings from Last 3 Encounters:  10/07/20 229 lb 6.4 oz (104.1 kg)  08/06/20 237 lb 6.4 oz (107.7 kg)  07/17/20 241 lb (109.3 kg)    General: Appears her stated age, obese, in NAD. Skin: Warm, dry and intact. HEENT: Head: normal shape and size; Eyes: sclera white and EOMs intact;  Cardiovascular: Normal rate and rhythm. S1,S2 noted.  No murmur, rubs or gallops noted.  Pulmonary/Chest: Normal effort and positive vesicular breath sounds. No respiratory distress. No wheezes, rales or ronchi noted.  Musculoskeletal: No difficulty with gait.  Neurological: Alert and oriented.  Psychiatric: Mood and affect normal. Behavior is normal. Judgment and thought content normal.    BMET    Component Value Date/Time   NA 140 08/16/2019 0420   K 4.0 08/16/2019 0420   CL 106 08/16/2019 0420   CO2 27 08/16/2019 0420   GLUCOSE 134 (H) 08/16/2019 0420   BUN 11 08/16/2019 0420   CREATININE 0.77 08/16/2019 0420   CREATININE 0.86 10/14/2018 1459   CALCIUM 8.3 (L) 08/16/2019 0420   GFRNONAA >60 08/16/2019 0420   GFRAA >60 08/16/2019 0420    Lipid Panel     Component Value Date/Time   CHOL 119  06/03/2016 1455   TRIG 58.0 06/03/2016 1455   HDL 30.30 (L) 06/03/2016 1455   CHOLHDL 4 06/03/2016 1455   VLDL 11.6 06/03/2016 1455   LDLCALC 77 06/03/2016 1455    CBC    Component Value Date/Time   WBC 13.5 (H) 08/16/2019 0420   RBC 4.32 08/16/2019 0420   HGB 13.1 08/16/2019 0420   HCT 39.8 08/16/2019 0420   PLT 332 08/16/2019 0420   MCV 92.1 08/16/2019 0420   MCH 30.3 08/16/2019 0420   MCHC 32.9 08/16/2019 0420   RDW 12.2 08/16/2019 0420   LYMPHSABS 1.5 08/16/2019 0420  MONOABS 0.5 08/16/2019 0420   EOSABS 0.0 08/16/2019 0420   BASOSABS 0.0 08/16/2019 0420    Hgb A1C Lab Results  Component Value Date   HGBA1C 5.5 10/14/2018           Assessment & Plan:    Webb Silversmith, NP This visit occurred during the SARS-CoV-2 public health emergency.  Safety protocols were in place, including screening questions prior to the visit, additional usage of staff PPE, and extensive cleaning of exam room while observing appropriate contact time as indicated for disinfecting solutions.

## 2020-10-18 DIAGNOSIS — M25851 Other specified joint disorders, right hip: Secondary | ICD-10-CM | POA: Diagnosis not present

## 2020-10-18 DIAGNOSIS — M24151 Other articular cartilage disorders, right hip: Secondary | ICD-10-CM | POA: Diagnosis not present

## 2020-10-18 DIAGNOSIS — M21851 Other specified acquired deformities of right thigh: Secondary | ICD-10-CM | POA: Diagnosis not present

## 2020-10-24 DIAGNOSIS — M25851 Other specified joint disorders, right hip: Secondary | ICD-10-CM | POA: Insufficient documentation

## 2020-10-24 DIAGNOSIS — M21851 Other specified acquired deformities of right thigh: Secondary | ICD-10-CM | POA: Insufficient documentation

## 2020-10-24 DIAGNOSIS — M24151 Other articular cartilage disorders, right hip: Secondary | ICD-10-CM | POA: Insufficient documentation

## 2020-11-01 ENCOUNTER — Other Ambulatory Visit: Payer: Self-pay | Admitting: Internal Medicine

## 2020-11-01 NOTE — Telephone Encounter (Signed)
Requested medication (s) are due for refill today: yes   Requested medication (s) are on the active medication list: yes   Last refill:  10/04/2020  Future visit scheduled: no  Notes to clinic:  this refill cannot be delegated    Requested Prescriptions  Pending Prescriptions Disp Refills   zolpidem (AMBIEN) 10 MG tablet [Pharmacy Med Name: ZOLPIDEM TARTRATE 10 MG TABLET] 30 tablet 0    Sig: TAKE 1 TABLET BY MOUTH AT BEDTIME AS NEEDED FOR SLEEP.     Not Delegated - Psychiatry:  Anxiolytics/Hypnotics Failed - 11/01/2020  9:22 AM      Failed - This refill cannot be delegated      Failed - Urine Drug Screen completed in last 360 days      Passed - Valid encounter within last 6 months    Recent Outpatient Visits           3 weeks ago Primary insomnia   [facility] [place], Coralie Keens, NP

## 2020-11-13 ENCOUNTER — Telehealth: Payer: BC Managed Care – PPO | Admitting: Nurse Practitioner

## 2020-11-13 DIAGNOSIS — H60331 Swimmer's ear, right ear: Secondary | ICD-10-CM | POA: Diagnosis not present

## 2020-11-13 MED ORDER — NEOMYCIN-POLYMYXIN-HC 3.5-10000-1 OT SOLN: 4.0000 [drp] | Freq: Four times a day (QID) | OTIC | 0 refills | Status: DC

## 2020-11-13 NOTE — Progress Notes (Signed)
E Visit for Swimmer's Ear  We are sorry that you are not feeling well. Here is how we plan to help!  I have prescribed: Neomycin 0.35%, polymyxin B 10,000 units/mL, and hydrocortisone 0,5% otic solution 4 drops in affected ears four times a day for 7 days    In certain cases swimmer's ear may progress to a more serious bacterial infection of the middle or inner ear.  If you have a fever 102 and up and significantly worsening symptoms, this could indicate a more serious infection moving to the middle/inner and needs face to face evaluation in an office by a provider.  Your symptoms should improve over the next 3 days and should resolve in about 7 days.  HOME CARE:  Wash your hands frequently. Do not place the tip of the bottle on your ear or touch it with your fingers. You can take Acetominophen 650 mg every 4-6 hours as needed for pain.  If pain is severe or moderate, you can apply a heating pad (set on low) or hot water bottle (wrapped in a towel) to outer ear for 20 minutes.  This will also increase drainage. Avoid ear plugs Do not use Q-tips After showers, help the water run out by tilting your head to one side.  GET HELP RIGHT AWAY IF:  Fever is over 102.2 degrees. You develop progressive ear pain or hearing loss. Ear symptoms persist longer than 3 days after treatment.  MAKE SURE YOU:  Understand these instructions. Will watch your condition. Will get help right away if you are not doing well or get worse.  TO PREVENT SWIMMER'S EAR: Use a bathing cap or custom fitted swim molds to keep your ears dry. Towel off after swimming to dry your ears. Tilt your head or pull your earlobes to allow the water to escape your ear canal. If there is still water in your ears, consider using a hairdryer on the lowest setting.   Thank you for choosing an e-visit.  Your e-visit answers were reviewed by a board certified advanced clinical practitioner to complete your personal care plan.  Depending upon the condition, your plan could have included both over the counter or prescription medications.  Please review your pharmacy choice. Make sure the pharmacy is open so you can pick up prescription now. If there is a problem, you may contact your provider through [employer] and have the prescription routed to another pharmacy.  Your safety is important to [place]. If you have drug allergies check your prescription carefully.   For the next 24 hours you can use MyChart to ask questions about today's visit, request a non-urgent call back, or ask for a work or school excuse. You will get an email in the next two days asking about your experience. I hope that your e-visit has been valuable and will speed your recovery.   5-10 minutes spent reviewing and documenting in chart.

## 2020-11-14 ENCOUNTER — Other Ambulatory Visit: Payer: Self-pay | Admitting: Internal Medicine

## 2020-11-14 NOTE — Telephone Encounter (Signed)
   Notes to clinic:  Alternative Requested:PATIENT'S COST FOR AUVI-Q IS >$500 CAN YOU SEND IN FOR EPIPEN / GENERIC EPIPEN? WE CANNOT SUBSTITUTE THESE FOR AUVI-Q.  Requested Prescriptions  Pending Prescriptions Disp Refills   EPINEPHRINE 0.3 mg/0.3 mL IJ SOAJ injection [Pharmacy Med Name: EPINEPHRINE 0.3 MG AUTO-INJECT]  0     Immunology: Antidotes Passed - 11/14/2020 12:58 PM      Passed - Valid encounter within last 12 months    Recent Outpatient Visits           1 month ago Primary insomnia   [facility] [place], Coralie Keens, NP

## 2020-12-02 ENCOUNTER — Other Ambulatory Visit: Payer: Self-pay | Admitting: Internal Medicine

## 2020-12-03 NOTE — Telephone Encounter (Signed)
Requested medication (s) are due for refill today: Yes  Requested medication (s) are on the active medication list: Yes  Last refill:  11/03/20  Future visit scheduled: No  Notes to clinic:  See request.    Requested Prescriptions  Pending Prescriptions Disp Refills   zolpidem (AMBIEN) 10 MG tablet [Pharmacy Med Name: ZOLPIDEM TARTRATE 10 MG TABLET] 30 tablet 0    Sig: TAKE 1 TABLET BY MOUTH EVERY DAY AT BEDTIME AS NEEDED FOR SLEEP     Not Delegated - Psychiatry:  Anxiolytics/Hypnotics Failed - 12/02/2020  6:53 PM      Failed - This refill cannot be delegated      Failed - Urine Drug Screen completed in last 360 days      Passed - Valid encounter within last 6 months    Recent Outpatient Visits           1 month ago Primary insomnia   [facility] [place], Coralie Keens, NP

## 2021-01-01 ENCOUNTER — Other Ambulatory Visit: Payer: Self-pay | Admitting: Internal Medicine

## 2021-01-02 MED ORDER — ZOLPIDEM TARTRATE 10 MG PO TABS: 10.0000 mg | ORAL_TABLET | Freq: Every day | ORAL | 0 refills | Status: DC

## 2021-01-31 ENCOUNTER — Other Ambulatory Visit: Payer: Self-pay | Admitting: Internal Medicine

## 2021-02-03 DIAGNOSIS — M76891 Other specified enthesopathies of right lower limb, excluding foot: Secondary | ICD-10-CM | POA: Diagnosis not present

## 2021-02-03 DIAGNOSIS — M25851 Other specified joint disorders, right hip: Secondary | ICD-10-CM | POA: Diagnosis not present

## 2021-02-03 DIAGNOSIS — M24151 Other articular cartilage disorders, right hip: Secondary | ICD-10-CM | POA: Diagnosis not present

## 2021-02-03 MED ORDER — ZOLPIDEM TARTRATE 10 MG PO TABS: 10.0000 mg | ORAL_TABLET | Freq: Every day | ORAL | 0 refills | Status: DC

## 2021-02-20 DIAGNOSIS — Z903 Acquired absence of stomach [part of]: Secondary | ICD-10-CM | POA: Insufficient documentation

## 2021-02-20 DIAGNOSIS — Z975 Presence of (intrauterine) contraceptive device: Secondary | ICD-10-CM | POA: Insufficient documentation

## 2021-02-22 DIAGNOSIS — Z20822 Contact with and (suspected) exposure to covid-19: Secondary | ICD-10-CM | POA: Diagnosis not present

## 2021-02-22 DIAGNOSIS — M24151 Other articular cartilage disorders, right hip: Secondary | ICD-10-CM | POA: Diagnosis not present

## 2021-02-22 DIAGNOSIS — M21851 Other specified acquired deformities of right thigh: Secondary | ICD-10-CM | POA: Diagnosis not present

## 2021-02-22 DIAGNOSIS — M25851 Other specified joint disorders, right hip: Secondary | ICD-10-CM | POA: Diagnosis not present

## 2021-02-24 DIAGNOSIS — F32A Depression, unspecified: Secondary | ICD-10-CM | POA: Diagnosis not present

## 2021-02-24 DIAGNOSIS — E6609 Other obesity due to excess calories: Secondary | ICD-10-CM | POA: Diagnosis not present

## 2021-02-24 DIAGNOSIS — Q6589 Other specified congenital deformities of hip: Secondary | ICD-10-CM | POA: Diagnosis not present

## 2021-02-24 DIAGNOSIS — Z91041 Radiographic dye allergy status: Secondary | ICD-10-CM | POA: Diagnosis not present

## 2021-02-24 DIAGNOSIS — I959 Hypotension, unspecified: Secondary | ICD-10-CM | POA: Diagnosis not present

## 2021-02-24 DIAGNOSIS — M25851 Other specified joint disorders, right hip: Secondary | ICD-10-CM | POA: Diagnosis not present

## 2021-02-24 DIAGNOSIS — Z8616 Personal history of COVID-19: Secondary | ICD-10-CM | POA: Diagnosis not present

## 2021-02-24 DIAGNOSIS — I9581 Postprocedural hypotension: Secondary | ICD-10-CM | POA: Diagnosis not present

## 2021-02-24 DIAGNOSIS — G4700 Insomnia, unspecified: Secondary | ICD-10-CM | POA: Diagnosis not present

## 2021-02-24 DIAGNOSIS — G8918 Other acute postprocedural pain: Secondary | ICD-10-CM | POA: Diagnosis not present

## 2021-02-24 DIAGNOSIS — F5104 Psychophysiologic insomnia: Secondary | ICD-10-CM | POA: Diagnosis not present

## 2021-02-24 DIAGNOSIS — Z9884 Bariatric surgery status: Secondary | ICD-10-CM | POA: Diagnosis not present

## 2021-02-24 DIAGNOSIS — Z01818 Encounter for other preprocedural examination: Secondary | ICD-10-CM | POA: Diagnosis not present

## 2021-02-24 DIAGNOSIS — Z7901 Long term (current) use of anticoagulants: Secondary | ICD-10-CM | POA: Diagnosis not present

## 2021-02-24 DIAGNOSIS — Z79899 Other long term (current) drug therapy: Secondary | ICD-10-CM | POA: Diagnosis not present

## 2021-02-24 DIAGNOSIS — J45909 Unspecified asthma, uncomplicated: Secondary | ICD-10-CM | POA: Diagnosis not present

## 2021-02-24 DIAGNOSIS — M24151 Other articular cartilage disorders, right hip: Secondary | ICD-10-CM | POA: Diagnosis not present

## 2021-02-24 DIAGNOSIS — M21851 Other specified acquired deformities of right thigh: Secondary | ICD-10-CM | POA: Diagnosis not present

## 2021-02-24 DIAGNOSIS — Z975 Presence of (intrauterine) contraceptive device: Secondary | ICD-10-CM | POA: Diagnosis not present

## 2021-02-24 DIAGNOSIS — M25551 Pain in right hip: Secondary | ICD-10-CM | POA: Diagnosis not present

## 2021-02-24 DIAGNOSIS — G932 Benign intracranial hypertension: Secondary | ICD-10-CM | POA: Diagnosis not present

## 2021-02-24 DIAGNOSIS — Z91013 Allergy to seafood: Secondary | ICD-10-CM | POA: Diagnosis not present

## 2021-02-24 DIAGNOSIS — F419 Anxiety disorder, unspecified: Secondary | ICD-10-CM | POA: Diagnosis not present

## 2021-02-25 ENCOUNTER — Telehealth: Payer: Self-pay | Admitting: Internal Medicine

## 2021-02-25 NOTE — Telephone Encounter (Signed)
Carrie Thompson from [facility], caed states [name] sent orders for PT and she wants to know if [name] will follow this. She states sill will call patient to let him know to schedule an appt also

## 2021-02-26 NOTE — Telephone Encounter (Signed)
yes

## 2021-02-26 NOTE — Telephone Encounter (Signed)
I called and notified [facility] recommendation.

## 2021-02-28 DIAGNOSIS — F988 Other specified behavioral and emotional disorders with onset usually occurring in childhood and adolescence: Secondary | ICD-10-CM | POA: Diagnosis not present

## 2021-02-28 DIAGNOSIS — Z8616 Personal history of COVID-19: Secondary | ICD-10-CM | POA: Diagnosis not present

## 2021-02-28 DIAGNOSIS — Z7982 Long term (current) use of aspirin: Secondary | ICD-10-CM | POA: Diagnosis not present

## 2021-02-28 DIAGNOSIS — G4700 Insomnia, unspecified: Secondary | ICD-10-CM | POA: Diagnosis not present

## 2021-02-28 DIAGNOSIS — E6609 Other obesity due to excess calories: Secondary | ICD-10-CM | POA: Diagnosis not present

## 2021-02-28 DIAGNOSIS — G932 Benign intracranial hypertension: Secondary | ICD-10-CM | POA: Diagnosis not present

## 2021-02-28 DIAGNOSIS — J45909 Unspecified asthma, uncomplicated: Secondary | ICD-10-CM | POA: Diagnosis not present

## 2021-02-28 DIAGNOSIS — F419 Anxiety disorder, unspecified: Secondary | ICD-10-CM | POA: Diagnosis not present

## 2021-02-28 DIAGNOSIS — Z4789 Encounter for other orthopedic aftercare: Secondary | ICD-10-CM | POA: Diagnosis not present

## 2021-02-28 DIAGNOSIS — F32A Depression, unspecified: Secondary | ICD-10-CM | POA: Diagnosis not present

## 2021-02-28 DIAGNOSIS — Z6825 Body mass index (BMI) 25.0-25.9, adult: Secondary | ICD-10-CM | POA: Diagnosis not present

## 2021-02-28 DIAGNOSIS — Z7901 Long term (current) use of anticoagulants: Secondary | ICD-10-CM | POA: Diagnosis not present

## 2021-03-04 ENCOUNTER — Other Ambulatory Visit: Payer: Self-pay | Admitting: Internal Medicine

## 2021-03-04 DIAGNOSIS — Z8616 Personal history of COVID-19: Secondary | ICD-10-CM | POA: Diagnosis not present

## 2021-03-04 DIAGNOSIS — G4700 Insomnia, unspecified: Secondary | ICD-10-CM | POA: Diagnosis not present

## 2021-03-04 DIAGNOSIS — F988 Other specified behavioral and emotional disorders with onset usually occurring in childhood and adolescence: Secondary | ICD-10-CM | POA: Diagnosis not present

## 2021-03-04 DIAGNOSIS — Z4789 Encounter for other orthopedic aftercare: Secondary | ICD-10-CM | POA: Diagnosis not present

## 2021-03-04 DIAGNOSIS — Z6825 Body mass index (BMI) 25.0-25.9, adult: Secondary | ICD-10-CM | POA: Diagnosis not present

## 2021-03-04 DIAGNOSIS — E6609 Other obesity due to excess calories: Secondary | ICD-10-CM | POA: Diagnosis not present

## 2021-03-04 DIAGNOSIS — Z7901 Long term (current) use of anticoagulants: Secondary | ICD-10-CM | POA: Diagnosis not present

## 2021-03-04 DIAGNOSIS — F419 Anxiety disorder, unspecified: Secondary | ICD-10-CM | POA: Diagnosis not present

## 2021-03-04 DIAGNOSIS — G932 Benign intracranial hypertension: Secondary | ICD-10-CM | POA: Diagnosis not present

## 2021-03-04 DIAGNOSIS — J45909 Unspecified asthma, uncomplicated: Secondary | ICD-10-CM | POA: Diagnosis not present

## 2021-03-04 DIAGNOSIS — Z7982 Long term (current) use of aspirin: Secondary | ICD-10-CM | POA: Diagnosis not present

## 2021-03-04 DIAGNOSIS — F32A Depression, unspecified: Secondary | ICD-10-CM | POA: Diagnosis not present

## 2021-03-04 MED ORDER — AMPHETAMINE-DEXTROAMPHETAMINE 30 MG PO TABS: 1.0000 | ORAL_TABLET | Freq: Two times a day (BID) | ORAL | 0 refills | Status: DC

## 2021-03-04 NOTE — Telephone Encounter (Signed)
Requested medication (s) are due for refill today: yes  Requested medication (s) are on the active medication list: yes  Last refill:  02/03/21 #30  Future visit scheduled: yes  Notes to clinic:  med not delegated to NT to RF   Requested Prescriptions  Pending Prescriptions Disp Refills   zolpidem (AMBIEN) 10 MG tablet [Pharmacy Med Name: ZOLPIDEM TARTRATE 10 MG TABLET] 30 tablet 0    Sig: TAKE 1 TABLET BY MOUTH EVERYDAY AT BEDTIME     Not Delegated - Psychiatry:  Anxiolytics/Hypnotics Failed - 03/04/2021  8:35 AM      Failed - This refill cannot be delegated      Failed - Urine Drug Screen completed in last 360 days      Passed - Valid encounter within last 6 months    Recent Outpatient Visits           4 months ago Primary insomnia   [facility] [place], Coralie Keens, NP       Future Appointments             In 2 days [place], Coralie Keens, NP [facility], [facility]

## 2021-03-06 ENCOUNTER — Telehealth (INDEPENDENT_AMBULATORY_CARE_PROVIDER_SITE_OTHER): Payer: BC Managed Care – PPO | Admitting: Internal Medicine

## 2021-03-06 ENCOUNTER — Other Ambulatory Visit: Payer: Self-pay

## 2021-03-06 VITALS — BP 110/80

## 2021-03-06 DIAGNOSIS — Z6825 Body mass index (BMI) 25.0-25.9, adult: Secondary | ICD-10-CM | POA: Diagnosis not present

## 2021-03-06 DIAGNOSIS — Z7901 Long term (current) use of anticoagulants: Secondary | ICD-10-CM | POA: Diagnosis not present

## 2021-03-06 DIAGNOSIS — F988 Other specified behavioral and emotional disorders with onset usually occurring in childhood and adolescence: Secondary | ICD-10-CM | POA: Diagnosis not present

## 2021-03-06 DIAGNOSIS — E6609 Other obesity due to excess calories: Secondary | ICD-10-CM | POA: Diagnosis not present

## 2021-03-06 DIAGNOSIS — Z9889 Other specified postprocedural states: Secondary | ICD-10-CM

## 2021-03-06 DIAGNOSIS — M25851 Other specified joint disorders, right hip: Secondary | ICD-10-CM

## 2021-03-06 DIAGNOSIS — Z4789 Encounter for other orthopedic aftercare: Secondary | ICD-10-CM | POA: Diagnosis not present

## 2021-03-06 DIAGNOSIS — J45909 Unspecified asthma, uncomplicated: Secondary | ICD-10-CM | POA: Diagnosis not present

## 2021-03-06 DIAGNOSIS — F419 Anxiety disorder, unspecified: Secondary | ICD-10-CM | POA: Diagnosis not present

## 2021-03-06 DIAGNOSIS — Z7982 Long term (current) use of aspirin: Secondary | ICD-10-CM | POA: Diagnosis not present

## 2021-03-06 DIAGNOSIS — F32A Depression, unspecified: Secondary | ICD-10-CM | POA: Diagnosis not present

## 2021-03-06 DIAGNOSIS — G4700 Insomnia, unspecified: Secondary | ICD-10-CM | POA: Diagnosis not present

## 2021-03-06 DIAGNOSIS — G932 Benign intracranial hypertension: Secondary | ICD-10-CM | POA: Diagnosis not present

## 2021-03-06 DIAGNOSIS — Z8616 Personal history of COVID-19: Secondary | ICD-10-CM | POA: Diagnosis not present

## 2021-03-06 NOTE — Progress Notes (Signed)
Virtual Visit via Video Note  I connected with Carrie Thompson on 03/06/21 at  4:00 PM EST by a video enabled telemedicine application and verified that I am speaking with the correct person using two identifiers.  Location: Patient: Home Provider: Office  Person's participating in this video call: Webb Silversmith, NP and Johnsie Kindred   I discussed the limitations of evaluation and management by telemedicine and the availability of in person appointments. The patient expressed understanding and agreed to proceed.  History of Present Illness:  Pt wanting to follow up after recent hip surgery. 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 Bobby Rumpf. She is wearing a brace. She is NWB for 6-8 weeks. She is not in significant pain, not currently taking any pain medications. She has started physical therapy at home. She is on Eliquis for DVT prophylaxis. She has a follow up with her surgeon 03/11/21.   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 60 tablet 0   EPINEPHrine 0.3 mg/0.3 mL IJ SOAJ injection Inject 0.3 mg into the muscle as needed for anaphylaxis. 1 each 0   fluticasone (FLONASE) 50 MCG/ACT nasal spray Place 2 sprays into both nostrils daily. 16 g 6   levonorgestrel (MIRENA, 52 MG,) 20 MCG/24HR IUD Mirena 20 mcg/24 hours (7 yrs) 52 mg intrauterine device  Take 1 device by intrauterine route.     neomycin-polymyxin-hydrocortisone (CORTISPORIN) OTIC solution Place 4 drops into both ears 4 (four) times daily. 10 mL 0   zolpidem (AMBIEN) 10 MG tablet TAKE 1 TABLET BY MOUTH  EVERYDAY AT BEDTIME 30 tablet 0   No current facility-administered medications for this visit.    Allergies  Allergen Reactions   Shellfish Allergy Anaphylaxis   Iodine Other (See Comments)    Pt states that she avoids iodine because of her reaction to shellfish.      Family History  Problem Relation Age of Onset   Other Mother        varicose veins   Varicose Veins Mother    Mental illness Mother    Diabetes Father    Mental illness Father    Rheum arthritis Maternal Grandmother    Hypertension Maternal Grandfather     Social History   Socioeconomic History   Marital status: Married    Spouse name: Not on file   Number of children: Not on file   Years of education: Not on file   Highest education level: Not on file  Occupational History   Not on file  Tobacco Use   Smoking status: Never   Smokeless tobacco: Never  Vaping Use   Vaping Use: Never used  Substance and Sexual Activity   Alcohol use: No    Alcohol/week: 0.0 standard drinks   Drug use: No   Sexual activity: Yes    Birth control/protection: I.U.D.  Other Topics Concern   Not on file  Social History Narrative   Not on file   Social Determinants of Health   Financial Resource Strain: Not on  file  Food Insecurity: Not on file  Transportation Needs: Not on file  Physical Activity: Not on file  Stress: Not on file  Social Connections: Not on file  Intimate Partner Violence: Not on file     Constitutional: Denies fever, malaise, fatigue, headache or abrupt weight changes.  Respiratory: Denies difficulty breathing, shortness of breath, cough or sputum production.   Cardiovascular: Denies chest pain, chest tightness, palpitations or swelling in the hands or feet.  Musculoskeletal: Pt reports decreased range of motion and difficulty with gait. Denies muscle pain or joint pain and swelling.  Skin: Denies redness, rashes, lesions or ulcercations.  Neurological: Denies numbness, tingling, weakness or  problems with balance and coordination.   No other specific complaints in a complete review of systems (except as listed in HPI above).    Observations/Objective:  Wt Readings from Last 3 Encounters:  10/07/20 229 lb 6.4 oz (104.1 kg)  08/06/20 237 lb 6.4 oz (107.7 kg)  07/17/20 241 lb (109.3 kg)    General: Appears her stated age, obese in NAD. Pulmonary/Chest: Normal effort. No respiratory distress.  Musculoskeletal: Gait not visualized. Neurological: Alert and oriented.   BMET    Component Value Date/Time   NA 140 08/16/2019 0420   K 4.0 08/16/2019 0420   CL 106 08/16/2019 0420   CO2 27 08/16/2019 0420   GLUCOSE 134 (H) 08/16/2019 0420   BUN 11 08/16/2019 0420   CREATININE 0.77 08/16/2019 0420   CREATININE 0.86 10/14/2018 1459   CALCIUM 8.3 (L) 08/16/2019 0420   GFRNONAA >60 08/16/2019 0420   GFRAA >60 08/16/2019 0420    Lipid Panel     Component Value Date/Time   CHOL 119 06/03/2016 1455   TRIG 58.0 06/03/2016 1455   HDL 30.30 (L) 06/03/2016 1455   CHOLHDL 4 06/03/2016 1455   VLDL 11.6 06/03/2016 1455   LDLCALC 77 06/03/2016 1455    CBC    Component Value Date/Time   WBC 13.5 (H) 08/16/2019 0420   RBC 4.32 08/16/2019 0420   HGB 13.1 08/16/2019 0420   HCT 39.8 08/16/2019 0420   PLT 332 08/16/2019 0420   MCV 92.1 08/16/2019 0420   MCH 30.3 08/16/2019 0420   MCHC 32.9 08/16/2019 0420   RDW 12.2 08/16/2019 0420   LYMPHSABS 1.5 08/16/2019 0420   MONOABS 0.5 08/16/2019 0420   EOSABS 0.0 08/16/2019 0420   BASOSABS 0.0 08/16/2019 0420    Hgb A1C Lab Results  Component Value Date   HGBA1C 5.5 10/14/2018       Assessment and Plan:  Surgical Follow Up for Osteotomy Right Hip for Hip Impingement:  She is doing well Continue Eliquis for DVT prophylaxis She will continue to work with PT, remains NWB She will follow up with surgeon as previously scheduled  Return precautions discussed Follow Up Instructions:    I discussed the assessment and  treatment plan with the patient. The patient was provided an opportunity to ask questions and all were answered. The patient agreed with the plan and demonstrated an understanding of the instructions.   The patient was advised to call back or seek an in-person evaluation if the symptoms worsen or if the condition fails to improve as anticipated.   Webb Silversmith, NP

## 2021-03-07 ENCOUNTER — Encounter: Payer: Self-pay | Admitting: Internal Medicine

## 2021-03-07 ENCOUNTER — Encounter (HOSPITAL_COMMUNITY): Payer: Self-pay | Admitting: *Deleted

## 2021-03-07 NOTE — Patient Instructions (Signed)
Hip Pain The hip is the joint between the upper legs and the lower pelvis. The bones, cartilage, tendons, and muscles of your hip joint support your body and allow you to move around. Hip pain can range from a minor ache to severe pain in one or both of your hips. The pain may be felt on the inside of the hip joint near the groin, or on the outside near the buttocks and upper thigh. You may also have swelling or stiffness in your hip area. Follow these instructions at home: Managing pain, stiffness, and swelling   If directed, put ice on the painful area. To do this: Put ice in a plastic bag. Place a towel between your skin and the bag. Leave the ice on for 20 minutes, 2-3 times a day. If directed, apply heat to the affected area as often as told by your health care provider. Use the heat source that your health care provider recommends, such as a moist heat pack or a heating pad. Place a towel between your skin and the heat source. Leave the heat on for 20-30 minutes. Remove the heat if your skin turns bright red. This is especially important if you are unable to feel pain, heat, or cold. You may have a greater risk of getting burned. Activity Do exercises as told by your health care provider. Avoid activities that cause pain. General instructions  Take over-the-counter and prescription medicines only as told by your health care provider. Keep a journal of your symptoms. Write down: How often you have hip pain. The location of your pain. What the pain feels like. What makes the pain worse. Sleep with a pillow between your legs on your most comfortable side. Keep all follow-up visits as told by your health care provider. This is important. Contact a health care provider if: You cannot put weight on your leg. Your pain or swelling continues or gets worse after one week. It gets harder to walk. You have a fever. Get help right away if: You fall. You have a sudden increase in pain and  swelling in your hip. Your hip is red or swollen or very tender to touch. Summary Hip pain can range from a minor ache to severe pain in one or both of your hips. The pain may be felt on the inside of the hip joint near the groin, or on the outside near the buttocks and upper thigh. Avoid activities that cause pain. Write down how often you have hip pain, the location of the pain, what makes it worse, and what it feels like. This information is not intended to replace advice given to you by your health care provider. Make sure you discuss any questions you have with your health care provider. Document Revised: 07/25/2018 Document Reviewed: 07/25/2018 Elsevier Patient Education  2022 [employer] Inc[employer]  

## 2021-03-11 DIAGNOSIS — Z9889 Other specified postprocedural states: Secondary | ICD-10-CM | POA: Diagnosis not present

## 2021-03-31 DIAGNOSIS — M2560 Stiffness of unspecified joint, not elsewhere classified: Secondary | ICD-10-CM | POA: Diagnosis not present

## 2021-03-31 DIAGNOSIS — Z9889 Other specified postprocedural states: Secondary | ICD-10-CM | POA: Diagnosis not present

## 2021-03-31 DIAGNOSIS — R531 Weakness: Secondary | ICD-10-CM | POA: Diagnosis not present

## 2021-04-02 DIAGNOSIS — M2560 Stiffness of unspecified joint, not elsewhere classified: Secondary | ICD-10-CM | POA: Diagnosis not present

## 2021-04-02 DIAGNOSIS — Z9889 Other specified postprocedural states: Secondary | ICD-10-CM | POA: Diagnosis not present

## 2021-04-02 DIAGNOSIS — R531 Weakness: Secondary | ICD-10-CM | POA: Diagnosis not present

## 2021-04-07 DIAGNOSIS — Z9889 Other specified postprocedural states: Secondary | ICD-10-CM | POA: Diagnosis not present

## 2021-04-07 DIAGNOSIS — M2560 Stiffness of unspecified joint, not elsewhere classified: Secondary | ICD-10-CM | POA: Diagnosis not present

## 2021-04-07 DIAGNOSIS — R531 Weakness: Secondary | ICD-10-CM | POA: Diagnosis not present

## 2021-04-09 ENCOUNTER — Other Ambulatory Visit: Payer: Self-pay | Admitting: Internal Medicine

## 2021-04-09 MED ORDER — ZOLPIDEM TARTRATE 10 MG PO TABS: 10.0000 mg | ORAL_TABLET | Freq: Every evening | ORAL | 0 refills | Status: DC | PRN

## 2021-04-14 DIAGNOSIS — M2560 Stiffness of unspecified joint, not elsewhere classified: Secondary | ICD-10-CM | POA: Diagnosis not present

## 2021-04-14 DIAGNOSIS — R531 Weakness: Secondary | ICD-10-CM | POA: Diagnosis not present

## 2021-04-14 DIAGNOSIS — Z9889 Other specified postprocedural states: Secondary | ICD-10-CM | POA: Diagnosis not present

## 2021-04-16 DIAGNOSIS — Z9889 Other specified postprocedural states: Secondary | ICD-10-CM | POA: Diagnosis not present

## 2021-04-16 DIAGNOSIS — M2560 Stiffness of unspecified joint, not elsewhere classified: Secondary | ICD-10-CM | POA: Diagnosis not present

## 2021-04-16 DIAGNOSIS — R531 Weakness: Secondary | ICD-10-CM | POA: Diagnosis not present

## 2021-04-18 DIAGNOSIS — Z9889 Other specified postprocedural states: Secondary | ICD-10-CM | POA: Diagnosis not present

## 2021-04-21 DIAGNOSIS — Z9889 Other specified postprocedural states: Secondary | ICD-10-CM | POA: Diagnosis not present

## 2021-04-21 DIAGNOSIS — R531 Weakness: Secondary | ICD-10-CM | POA: Diagnosis not present

## 2021-04-21 DIAGNOSIS — M2560 Stiffness of unspecified joint, not elsewhere classified: Secondary | ICD-10-CM | POA: Diagnosis not present

## 2021-04-23 DIAGNOSIS — M2560 Stiffness of unspecified joint, not elsewhere classified: Secondary | ICD-10-CM | POA: Diagnosis not present

## 2021-04-23 DIAGNOSIS — R531 Weakness: Secondary | ICD-10-CM | POA: Diagnosis not present

## 2021-04-23 DIAGNOSIS — Z9889 Other specified postprocedural states: Secondary | ICD-10-CM | POA: Diagnosis not present

## 2021-04-28 DIAGNOSIS — R531 Weakness: Secondary | ICD-10-CM | POA: Diagnosis not present

## 2021-04-28 DIAGNOSIS — M2560 Stiffness of unspecified joint, not elsewhere classified: Secondary | ICD-10-CM | POA: Diagnosis not present

## 2021-04-28 DIAGNOSIS — Z9889 Other specified postprocedural states: Secondary | ICD-10-CM | POA: Diagnosis not present

## 2021-04-30 DIAGNOSIS — Z9889 Other specified postprocedural states: Secondary | ICD-10-CM | POA: Diagnosis not present

## 2021-04-30 DIAGNOSIS — R531 Weakness: Secondary | ICD-10-CM | POA: Diagnosis not present

## 2021-04-30 DIAGNOSIS — M2560 Stiffness of unspecified joint, not elsewhere classified: Secondary | ICD-10-CM | POA: Diagnosis not present

## 2021-05-07 DIAGNOSIS — R531 Weakness: Secondary | ICD-10-CM | POA: Diagnosis not present

## 2021-05-07 DIAGNOSIS — M2560 Stiffness of unspecified joint, not elsewhere classified: Secondary | ICD-10-CM | POA: Diagnosis not present

## 2021-05-07 DIAGNOSIS — Z9889 Other specified postprocedural states: Secondary | ICD-10-CM | POA: Diagnosis not present

## 2021-05-11 ENCOUNTER — Other Ambulatory Visit: Payer: Self-pay | Admitting: Internal Medicine

## 2021-05-12 MED ORDER — ZOLPIDEM TARTRATE 10 MG PO TABS: 10.0000 mg | ORAL_TABLET | Freq: Every evening | ORAL | 0 refills | Status: DC | PRN

## 2021-05-12 MED ORDER — AMPHETAMINE-DEXTROAMPHETAMINE 30 MG PO TABS: 1.0000 | ORAL_TABLET | Freq: Two times a day (BID) | ORAL | 0 refills | Status: DC

## 2021-05-14 DIAGNOSIS — M2560 Stiffness of unspecified joint, not elsewhere classified: Secondary | ICD-10-CM | POA: Diagnosis not present

## 2021-05-14 DIAGNOSIS — Z9889 Other specified postprocedural states: Secondary | ICD-10-CM | POA: Diagnosis not present

## 2021-05-14 DIAGNOSIS — R531 Weakness: Secondary | ICD-10-CM | POA: Diagnosis not present

## 2021-05-16 DIAGNOSIS — M2560 Stiffness of unspecified joint, not elsewhere classified: Secondary | ICD-10-CM | POA: Diagnosis not present

## 2021-05-16 DIAGNOSIS — Z9889 Other specified postprocedural states: Secondary | ICD-10-CM | POA: Diagnosis not present

## 2021-05-16 DIAGNOSIS — R531 Weakness: Secondary | ICD-10-CM | POA: Diagnosis not present

## 2021-05-19 DIAGNOSIS — Z9889 Other specified postprocedural states: Secondary | ICD-10-CM | POA: Diagnosis not present

## 2021-05-19 DIAGNOSIS — M2560 Stiffness of unspecified joint, not elsewhere classified: Secondary | ICD-10-CM | POA: Diagnosis not present

## 2021-05-19 DIAGNOSIS — R531 Weakness: Secondary | ICD-10-CM | POA: Diagnosis not present

## 2021-05-26 DIAGNOSIS — M2560 Stiffness of unspecified joint, not elsewhere classified: Secondary | ICD-10-CM | POA: Diagnosis not present

## 2021-05-26 DIAGNOSIS — R531 Weakness: Secondary | ICD-10-CM | POA: Diagnosis not present

## 2021-05-26 DIAGNOSIS — Z9889 Other specified postprocedural states: Secondary | ICD-10-CM | POA: Diagnosis not present

## 2021-05-27 DIAGNOSIS — Z9889 Other specified postprocedural states: Secondary | ICD-10-CM | POA: Diagnosis not present

## 2021-05-28 DIAGNOSIS — M2560 Stiffness of unspecified joint, not elsewhere classified: Secondary | ICD-10-CM | POA: Diagnosis not present

## 2021-05-28 DIAGNOSIS — R531 Weakness: Secondary | ICD-10-CM | POA: Diagnosis not present

## 2021-05-28 DIAGNOSIS — Z9889 Other specified postprocedural states: Secondary | ICD-10-CM | POA: Diagnosis not present

## 2021-06-02 DIAGNOSIS — M2560 Stiffness of unspecified joint, not elsewhere classified: Secondary | ICD-10-CM | POA: Diagnosis not present

## 2021-06-02 DIAGNOSIS — Z9889 Other specified postprocedural states: Secondary | ICD-10-CM | POA: Diagnosis not present

## 2021-06-02 DIAGNOSIS — R531 Weakness: Secondary | ICD-10-CM | POA: Diagnosis not present

## 2021-06-04 DIAGNOSIS — R531 Weakness: Secondary | ICD-10-CM | POA: Diagnosis not present

## 2021-06-04 DIAGNOSIS — M2560 Stiffness of unspecified joint, not elsewhere classified: Secondary | ICD-10-CM | POA: Diagnosis not present

## 2021-06-04 DIAGNOSIS — Z9889 Other specified postprocedural states: Secondary | ICD-10-CM | POA: Diagnosis not present

## 2021-06-12 ENCOUNTER — Other Ambulatory Visit: Payer: Self-pay | Admitting: Internal Medicine

## 2021-06-13 MED ORDER — ZOLPIDEM TARTRATE 10 MG PO TABS: 10.0000 mg | ORAL_TABLET | Freq: Every evening | ORAL | 0 refills | Status: DC | PRN

## 2021-06-16 DIAGNOSIS — Z9889 Other specified postprocedural states: Secondary | ICD-10-CM | POA: Diagnosis not present

## 2021-06-16 DIAGNOSIS — M2560 Stiffness of unspecified joint, not elsewhere classified: Secondary | ICD-10-CM | POA: Diagnosis not present

## 2021-06-16 DIAGNOSIS — R531 Weakness: Secondary | ICD-10-CM | POA: Diagnosis not present

## 2021-06-25 DIAGNOSIS — Z9889 Other specified postprocedural states: Secondary | ICD-10-CM | POA: Diagnosis not present

## 2021-06-25 DIAGNOSIS — R531 Weakness: Secondary | ICD-10-CM | POA: Diagnosis not present

## 2021-06-25 DIAGNOSIS — M2560 Stiffness of unspecified joint, not elsewhere classified: Secondary | ICD-10-CM | POA: Diagnosis not present

## 2021-06-30 DIAGNOSIS — M2560 Stiffness of unspecified joint, not elsewhere classified: Secondary | ICD-10-CM | POA: Diagnosis not present

## 2021-06-30 DIAGNOSIS — Z9889 Other specified postprocedural states: Secondary | ICD-10-CM | POA: Diagnosis not present

## 2021-06-30 DIAGNOSIS — R531 Weakness: Secondary | ICD-10-CM | POA: Diagnosis not present

## 2021-07-07 DIAGNOSIS — Z9889 Other specified postprocedural states: Secondary | ICD-10-CM | POA: Diagnosis not present

## 2021-07-07 DIAGNOSIS — R531 Weakness: Secondary | ICD-10-CM | POA: Diagnosis not present

## 2021-07-07 DIAGNOSIS — M2560 Stiffness of unspecified joint, not elsewhere classified: Secondary | ICD-10-CM | POA: Diagnosis not present

## 2021-07-14 ENCOUNTER — Other Ambulatory Visit: Payer: Self-pay | Admitting: Internal Medicine

## 2021-07-14 MED ORDER — ZOLPIDEM TARTRATE 10 MG PO TABS: 10.0000 mg | ORAL_TABLET | Freq: Every evening | ORAL | 0 refills | Status: DC | PRN

## 2021-07-14 MED ORDER — AMPHETAMINE-DEXTROAMPHETAMINE 30 MG PO TABS: 1.0000 | ORAL_TABLET | Freq: Two times a day (BID) | ORAL | 0 refills | Status: DC

## 2021-08-13 ENCOUNTER — Other Ambulatory Visit: Payer: Self-pay | Admitting: Internal Medicine

## 2021-08-13 MED ORDER — ZOLPIDEM TARTRATE 10 MG PO TABS: 10.0000 mg | ORAL_TABLET | Freq: Every evening | ORAL | 0 refills | Status: DC | PRN

## 2021-09-05 DIAGNOSIS — Z9889 Other specified postprocedural states: Secondary | ICD-10-CM | POA: Diagnosis not present

## 2021-09-05 DIAGNOSIS — M25552 Pain in left hip: Secondary | ICD-10-CM | POA: Diagnosis not present

## 2021-09-05 DIAGNOSIS — R937 Abnormal findings on diagnostic imaging of other parts of musculoskeletal system: Secondary | ICD-10-CM | POA: Diagnosis not present

## 2021-09-11 ENCOUNTER — Other Ambulatory Visit: Payer: Self-pay | Admitting: Internal Medicine

## 2021-09-11 MED ORDER — AMPHETAMINE-DEXTROAMPHETAMINE 30 MG PO TABS: 1.0000 | ORAL_TABLET | Freq: Two times a day (BID) | ORAL | 0 refills | Status: DC

## 2021-09-11 MED ORDER — ZOLPIDEM TARTRATE 10 MG PO TABS: 10.0000 mg | ORAL_TABLET | Freq: Every evening | ORAL | 0 refills | Status: DC | PRN

## 2021-10-14 ENCOUNTER — Other Ambulatory Visit: Payer: Self-pay | Admitting: Internal Medicine

## 2021-10-15 ENCOUNTER — Other Ambulatory Visit: Payer: Self-pay | Admitting: Internal Medicine

## 2021-10-15 NOTE — Telephone Encounter (Signed)
Medication Refill - Medication: amphetamine-dextroamphetamine (ADDERALL) 30 MG tablet and zolpidem (AMBIEN) 10 MG tablet  Has the patient contacted their pharmacy? Yes.   Pt told to contact provider  Preferred Pharmacy (with phone number or street name):  [id number] [place]:  [phone] Fax:  [phone]    Has the patient been seen for an appointment in the last year OR does the patient have an upcoming appointment? Yes.    Agent: Please be advised that RX refills may take up to 3 business days. We ask that you follow-up with your pharmacy.

## 2021-10-16 MED ORDER — AMPHETAMINE-DEXTROAMPHETAMINE 30 MG PO TABS: 1.0000 | ORAL_TABLET | Freq: Two times a day (BID) | ORAL | 0 refills | Status: DC

## 2021-10-16 NOTE — Telephone Encounter (Signed)
Requested medication (s) are due for refill today: yes  Requested medication (s) are on the active medication list: yes  Last refill:  09/11/21  Future visit scheduled: yes  Notes to clinic:  med not delegated to NT to RF   Requested Prescriptions  Pending Prescriptions Disp Refills   amphetamine-dextroamphetamine (ADDERALL) 30 MG tablet 60 tablet 0    Sig: Take 1 tablet by mouth 2 (two) times daily.     Not Delegated - Psychiatry:  Stimulants/ADHD Failed - 10/15/2021  5:24 PM      Failed - This refill cannot be delegated      Failed - Urine Drug Screen completed in last 360 days      Failed - Valid encounter within last 6 months    Recent Outpatient Visits           7 months ago Status post osteotomy   [place], Coralie Keens, NP   1 year ago Primary insomnia   [facility] [place], Coralie Keens, NP       Future Appointments             In 1 week Jearld Fenton, NP [place] BP in normal range    BP Readings from Last 1 Encounters:  03/06/21 110/80         Passed - Last Heart Rate in normal range    Pulse Readings from Last 1 Encounters:  10/07/20 64

## 2021-10-17 ENCOUNTER — Ambulatory Visit: Payer: Self-pay

## 2021-10-17 ENCOUNTER — Other Ambulatory Visit: Payer: Self-pay | Admitting: Internal Medicine

## 2021-10-17 NOTE — Telephone Encounter (Signed)
  Chief Complaint: med refill Symptoms: unable to sleep without Ambien Frequency: today Pertinent Negatives: NA Disposition: '[]'$ ED /'[]'$ Urgent Care (no appt availability in office) / '[]'$ Appointment(In office/virtual)/ '[]'$  [facility]/ '[]'$ Home Care/ '[]'$ Refused Recommended Disposition /'[]'$ [facility]/ '[x]'$  Follow-up with PCP Additional Notes: pt called, she is asking why medication wasn't sent in when her Addderall was and she requested them together. I advised pt it was denied d/t needing an appt and she states she scheduled an appt 10/28/21. Pt called yesterday to fu on refill for both medications. I advised her that this was NT fault and Adderrall was selected but the Ambien was not. I explained to pt that I was going to reach out to [place] in secure chat and see if she would respond and send message HP as well even tho office has closed for the day. Advised pt if this doesn't work she can go to UC or ED and she said she wasn't doing that because she felt it was drug seeking and abusing and she is just needing a refill. Advised her I will fu with her once I get a response.   Summary: Medication Mangement   Patient states her zolpidem (AMBIEN) 10 MG tablet was not re-filled. Patient states she is completely out and can not sleep with out it.  Patient unsure what she should do. Please advise       Reason for Disposition  Caller requesting a CONTROLLED substance prescription refill (e.g., narcotics, ADHD medicines)  Answer Assessment - Initial Assessment Questions 1. DRUG NAME: "What medicine do you need to have refilled?"     Ambien 2. REFILLS REMAINING: "How many refills are remaining?" (Note: The label on the medicine or pill bottle will show how many refills are remaining. If there are no refills remaining, then a renewal may be needed.)     0  4. PRESCRIBING HCP: "Who prescribed it?" Reason: If prescribed by specialist, call should be referred to that group.     Rollene Fare, NP 5.  SYMPTOMS: "Do you have any symptoms?"     Unable to sleep without medication  Protocols used: Medication Refill and Renewal Call-A-AH

## 2021-10-17 NOTE — Telephone Encounter (Signed)
Refill request for Ambien  Requested medication (s) are due for refill today: yes  Requested medication (s) are on the active medication list: yes  Last refill:  09/11/21 #30/0  Future visit scheduled: yes scheduled appt for 10/28/21  Notes to clinic:  Unable to refill per protocol, cannot delegate.  Requested Prescriptions  Refused Prescriptions Disp Refills   zolpidem (AMBIEN) 10 MG tablet 30 tablet 0    Sig: Take 1 tablet (10 mg total) by mouth at bedtime as needed for sleep.     Not Delegated - Psychiatry:  Anxiolytics/Hypnotics Failed - 10/14/2021  5:52 PM      Failed - This refill cannot be delegated      Failed - Urine Drug Screen completed in last 360 days      Failed - Valid encounter within last 6 months    Recent Outpatient Visits           7 months ago Status post osteotomy   [facility] [place], Coralie Keens, NP   1 year ago Primary insomnia   [facility] [place], Coralie Keens, NP       Future Appointments             In 1 week Garnette Gunner, Coralie Keens, NP [facility]

## 2021-10-20 ENCOUNTER — Other Ambulatory Visit: Payer: Self-pay | Admitting: Internal Medicine

## 2021-10-20 MED ORDER — ZOLPIDEM TARTRATE 10 MG PO TABS: 10.0000 mg | ORAL_TABLET | Freq: Every evening | ORAL | 0 refills | Status: DC | PRN

## 2021-10-20 NOTE — Telephone Encounter (Signed)
Adderral sent in 7/27. Ambien sent in 7/31.  KP

## 2021-10-23 DIAGNOSIS — M25552 Pain in left hip: Secondary | ICD-10-CM | POA: Diagnosis not present

## 2021-10-23 DIAGNOSIS — M24152 Other articular cartilage disorders, left hip: Secondary | ICD-10-CM | POA: Diagnosis not present

## 2021-10-28 ENCOUNTER — Ambulatory Visit (INDEPENDENT_AMBULATORY_CARE_PROVIDER_SITE_OTHER): Payer: BC Managed Care – PPO | Admitting: Internal Medicine

## 2021-10-28 ENCOUNTER — Encounter: Payer: Self-pay | Admitting: Internal Medicine

## 2021-10-28 VITALS — BP 126/82 | HR 56 | Temp 96.9°F | Ht 68.0 in | Wt 218.0 lb

## 2021-10-28 DIAGNOSIS — Z0001 Encounter for general adult medical examination with abnormal findings: Secondary | ICD-10-CM

## 2021-10-28 DIAGNOSIS — E6609 Other obesity due to excess calories: Secondary | ICD-10-CM | POA: Diagnosis not present

## 2021-10-28 DIAGNOSIS — Z79899 Other long term (current) drug therapy: Secondary | ICD-10-CM

## 2021-10-28 DIAGNOSIS — Z6833 Body mass index (BMI) 33.0-33.9, adult: Secondary | ICD-10-CM

## 2021-10-28 NOTE — Patient Instructions (Signed)

## 2021-10-28 NOTE — Assessment & Plan Note (Signed)
Encourage diet and exercise for weight loss 

## 2021-10-28 NOTE — Progress Notes (Signed)
Subjective:    Patient ID: Carrie Thompson, female    DOB: Feb 26, 1991, 31 y.o.   MRN: [id number]  HPI  Patient presents to clinic today for her annual exam.    Flu: never Tetanus: 04/2012 Covid: Rande Brunt Pap smear: [facility] Dentist: as needed  Diet: She does eat meat. She consume some fruits and veggies. She does eat some fried foods. She drinks mostly water and coffee. 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 60 tablet 0   apixaban (ELIQUIS) 2.5 MG TABS tablet Take 2.5 mg by mouth 2 (two) times daily.     EPINEPHrine 0.3 mg/0.3 mL IJ SOAJ injection Inject 0.3 mg into the muscle as needed for anaphylaxis. 1 each 0   fluticasone (FLONASE) 50 MCG/ACT nasal spray Place 2 sprays into both nostrils daily. (Patient not taking: Reported on 03/06/2021) 16 g 6   gabapentin (NEURONTIN) 300 MG capsule Take 300 mg by mouth 3 (three) times daily.     levonorgestrel (MIRENA, 52 MG,) 20 MCG/24HR IUD Mirena 20 mcg/24 hours (7 yrs) 52 mg intrauterine device  Take 1 device by intrauterine route.     levonorgestrel (MIRENA, 52 MG,) 20 MCG/DAY IUD Mirena 20 mcg/24 hours (8 yrs) 52 mg intrauterine device  Take 1 device by intrauterine route.     tiZANidine (ZANAFLEX) 4 MG tablet Take 4 mg by mouth 3 (three) times daily as needed.     zolpidem (AMBIEN) 10 MG tablet Take 1 tablet (10 mg total) by mouth at bedtime as needed for sleep. 30 tablet 0   No current facility-administered medications for this visit.    Allergies  Allergen Reactions   Shellfish Allergy Anaphylaxis   Iodine  Other (See Comments)    Pt states that she avoids iodine because of her reaction to shellfish.      Family History  Problem Relation Age of Onset   Other Mother        varicose veins   Varicose Veins Mother    Mental illness Mother    Diabetes Father    Mental illness Father    Rheum arthritis Maternal Grandmother    Hypertension Maternal Grandfather     Social History   Socioeconomic History   Marital status: Married    Spouse name: Not on file   Number of children: Not on file   Years of education: Not on file   Highest education level: Not on file  Occupational History   Not on file  Tobacco Use   Smoking status: Never   Smokeless tobacco: Never  Vaping Use   Vaping Use: Never used  Substance and Sexual Activity   Alcohol use: No    Alcohol/week: 0.0 standard drinks of alcohol   Drug use: No   Sexual activity: Yes    Birth control/protection: I.U.D.  Other Topics Concern   Not on file  Social History Narrative   Not on file   Social Determinants of Health   Financial Resource Strain:  Not on file  Food Insecurity: Not on file  Transportation Needs: Not on file  Physical Activity: Not on file  Stress: Not on file  Social Connections: Not on file  Intimate Partner Violence: Not on file     Constitutional: Denies fever, malaise, fatigue, headache or abrupt weight changes.  HEENT: Denies eye pain, eye redness, ear pain, ringing in the ears, wax buildup, runny nose, nasal congestion, bloody nose, or sore throat. Respiratory: Denies difficulty breathing, shortness of breath, cough or sputum production.   Cardiovascular: Denies chest pain, chest tightness, palpitations or swelling in the hands or feet.  Gastrointestinal: Denies abdominal pain, bloating, constipation, diarrhea or blood in the stool.  GU: Denies urgency, frequency, pain with urination, burning sensation, blood in urine, odor or discharge. Musculoskeletal: Denies decrease in range of motion,  difficulty with gait, muscle pain or joint pain and swelling.  Skin: Denies redness, rashes, lesions or ulcercations.  Neurological: Patient reports inattention, insomnia.  Denies dizziness, difficulty with memory, difficulty with speech or problems with balance and coordination.  Psych: Patient has a history of anxiety and depression.  Denies SI/HI.  No other specific complaints in a complete review of systems (except as listed in HPI above).  Objective:   Physical Exam  BP 126/82 (BP Location: Right Arm, Patient Position: Sitting, Cuff Size: Normal)   Pulse (!) 56   Temp (!) 96.9 F (36.1 C) (Temporal)   Ht '5\' 8"'$  (1.727 m)   Wt 218 lb (98.9 kg)   SpO2 99%   BMI 33.15 kg/m   Wt Readings from Last 3 Encounters:  10/07/20 229 lb 6.4 oz (104.1 kg)  08/06/20 237 lb 6.4 oz (107.7 kg)  07/17/20 241 lb (109.3 kg)    General: Appears her stated age, obese, in NAD. Skin: Warm, dry and intact.  HEENT: Head: normal shape and size; Eyes: sclera white, no icterus, conjunctiva pink, PERRLA and EOMs intact;  Neck:  Neck supple, trachea midline. No masses, lumps or thyromegaly present.  Cardiovascular: Bradycardic with normal rhythm. S1,S2 noted.  No murmur, rubs or gallops noted. No JVD or BLE edema.  Pulmonary/Chest: Normal effort and positive vesicular breath sounds. No respiratory distress. No wheezes, rales or ronchi noted.  Abdomen:  Normal bowel sounds.  Musculoskeletal: Strength 5/5 BUE/BLE.  No difficulty with gait.  Neurological: Alert and oriented. Cranial nerves II-XII grossly intact. Coordination normal.  Psychiatric: Mood and affect normal. Behavior is normal. Judgment and thought content normal.    BMET    Component Value Date/Time   NA 140 08/16/2019 0420   K 4.0 08/16/2019 0420   CL 106 08/16/2019 0420   CO2 27 08/16/2019 0420   GLUCOSE 134 (H) 08/16/2019 0420   BUN 11 08/16/2019 0420   CREATININE 0.77 08/16/2019 0420   CREATININE 0.86 10/14/2018 1459   CALCIUM 8.3  (L) 08/16/2019 0420   GFRNONAA >60 08/16/2019 0420   GFRAA >60 08/16/2019 0420    Lipid Panel     Component Value Date/Time   CHOL 119 06/03/2016 1455   TRIG 58.0 06/03/2016 1455   HDL 30.30 (L) 06/03/2016 1455   CHOLHDL 4 06/03/2016 1455   VLDL 11.6 06/03/2016 1455   LDLCALC 77 06/03/2016 1455    CBC    Component Value Date/Time   WBC 13.5 (H) 08/16/2019 0420   RBC 4.32 08/16/2019 0420   HGB 13.1 08/16/2019 0420   HCT 39.8 08/16/2019 0420   PLT 332 08/16/2019 0420   MCV 92.1 08/16/2019 0420  MCH 30.3 08/16/2019 0420   MCHC 32.9 08/16/2019 0420   RDW 12.2 08/16/2019 0420   LYMPHSABS 1.5 08/16/2019 0420   MONOABS 0.5 08/16/2019 0420   EOSABS 0.0 08/16/2019 0420   BASOSABS 0.0 08/16/2019 0420    Hgb A1C Lab Results  Component Value Date   HGBA1C 5.5 10/14/2018           Assessment & Plan:   Preventative Health Maintenance:  Encouraged her to get a flu shot in the fall Tetanus UTD Encouraged her to get a COVID booster Pap smear UTD, will request copy Encouraged her to consume a balanced diet and exercise regimen Advised her to see an eye doctor and dentist annually She declines labs today  RTC in 6 months for follow-up of chronic conditions Webb Silversmith, NP

## 2021-10-29 LAB — DM TEMPLATE

## 2021-10-29 LAB — DRUG MONITORING, PANEL 8 WITH CONFIRMATION, URINE
6 Acetylmorphine: NEGATIVE ng/mL (ref <10)
Alcohol Metabolites: NEGATIVE ng/mL (ref <500)
Amphetamines: NEGATIVE ng/mL (ref <500)
Benzodiazepines: NEGATIVE ng/mL (ref <100)
Buprenorphine, Urine: NEGATIVE ng/mL (ref <5)
Cocaine Metabolite: NEGATIVE ng/mL (ref <150)
Creatinine: 23.9 mg/dL (ref >=20.0)
MDMA: NEGATIVE ng/mL (ref <500)
Marijuana Metabolite: NEGATIVE ng/mL (ref <20)
Opiates: NEGATIVE ng/mL (ref <100)
Oxidant: NEGATIVE ug/mL (ref <200)
Oxycodone: NEGATIVE ng/mL (ref <100)
pH: 6.3 (ref 4.5–9.0)

## 2021-11-17 ENCOUNTER — Other Ambulatory Visit: Payer: Self-pay | Admitting: Internal Medicine

## 2021-11-17 MED ORDER — ZOLPIDEM TARTRATE 10 MG PO TABS: 10.0000 mg | ORAL_TABLET | Freq: Every evening | ORAL | 0 refills | Status: DC | PRN

## 2021-12-16 ENCOUNTER — Other Ambulatory Visit: Payer: Self-pay | Admitting: Internal Medicine

## 2021-12-17 MED ORDER — ZOLPIDEM TARTRATE 10 MG PO TABS: 10.0000 mg | ORAL_TABLET | Freq: Every evening | ORAL | 0 refills | Status: DC | PRN

## 2021-12-17 MED ORDER — AMPHETAMINE-DEXTROAMPHETAMINE 30 MG PO TABS: 1.0000 | ORAL_TABLET | Freq: Two times a day (BID) | ORAL | 0 refills | Status: DC

## 2022-01-18 ENCOUNTER — Other Ambulatory Visit: Payer: Self-pay | Admitting: Internal Medicine

## 2022-01-19 MED ORDER — ZOLPIDEM TARTRATE 10 MG PO TABS: 10.0000 mg | ORAL_TABLET | Freq: Every evening | ORAL | 0 refills | Status: DC | PRN

## 2022-02-06 ENCOUNTER — Encounter: Payer: Self-pay | Admitting: Internal Medicine

## 2022-02-06 ENCOUNTER — Ambulatory Visit (INDEPENDENT_AMBULATORY_CARE_PROVIDER_SITE_OTHER): Payer: BC Managed Care – PPO | Admitting: Internal Medicine

## 2022-02-06 VITALS — BP 128/84 | HR 72 | Temp 96.9°F | Wt 217.0 lb

## 2022-02-06 DIAGNOSIS — T753XXA Motion sickness, initial encounter: Secondary | ICD-10-CM

## 2022-02-06 DIAGNOSIS — R0789 Other chest pain: Secondary | ICD-10-CM

## 2022-02-06 DIAGNOSIS — D171 Benign lipomatous neoplasm of skin and subcutaneous tissue of trunk: Secondary | ICD-10-CM | POA: Diagnosis not present

## 2022-02-06 MED ORDER — SCOPOLAMINE 1 MG/3DAYS TD PT72: 1.0000 | MEDICATED_PATCH | TRANSDERMAL | 0 refills | Status: DC

## 2022-02-06 NOTE — Progress Notes (Signed)
Subjective:    Patient ID: Carrie Thompson, female    DOB: [birth year], 31 y.o.   MRN: [id number]  HPI  Patient presents to clinic today with complaint of a lump on her back.  She noticed this 1 month ago. She does feel like it has gotten bigger in size. She reports she thought it was a mucsle, but it caused discomfort with massage. She has not noticed any drainage from the area.  She is going on a cruise for 8 days. She would like a RX for scopolamine patches  She also reports chest tightness and a burning sensation in her chest. She recently did a clinical trial to test breathing in ozone pollution. She had to do breathing test and failed the breathing test. She was told she needed to be checked for asthma. She denies chronic cough or SOB. She denies reflux symptoms.   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 60 tablet 0   EPINEPHrine 0.3 mg/0.3 mL IJ SOAJ injection Inject 0.3 mg into the muscle as needed for anaphylaxis. 1 each 0   levonorgestrel (MIRENA, 52 MG,) 20 MCG/24HR IUD Mirena 20 mcg/24 hours (7 yrs) 52 mg intrauterine device  Take 1 device by intrauterine route.     levonorgestrel (MIRENA, 52 MG,) 20 MCG/DAY IUD Mirena 20 mcg/24 hours (8 yrs) 52 mg intrauterine device  Take 1 device by intrauterine route.     zolpidem (AMBIEN) 10 MG tablet Take 1 tablet (10 mg total) by mouth at bedtime as needed for sleep. 30 tablet 0   No current facility-administered medications for this visit.    Allergies  Allergen Reactions   Shellfish Allergy Anaphylaxis   Iodine Other (See Comments)    Pt states  that she avoids iodine because of her reaction to shellfish.      Family History  Problem Relation Age of Onset   Other Mother        varicose veins   Varicose Veins Mother    Mental illness Mother    Diabetes Father    Mental illness Father    Rheum arthritis Maternal Grandmother    Hypertension Maternal Grandfather     Social History   Socioeconomic History   Marital status: Married    Spouse name: Not on file   Number of children: Not on file   Years of education: Not on file   Highest education level: Not on file  Occupational History   Not on file  Tobacco Use   Smoking status: Never   Smokeless tobacco: Never  Vaping Use   Vaping Use: Never used  Substance and Sexual Activity   Alcohol use: No    Alcohol/week: 0.0 standard drinks of alcohol   Drug use: No   Sexual activity: Yes    Birth control/protection: I.U.D.  Other Topics Concern   Not on file  Social History Narrative   Not on file   Social Determinants of Health   Financial Resource Strain: Not on file  Food Insecurity: Not  on file  Transportation Needs: Not on file  Physical Activity: Not on file  Stress: Not on file  Social Connections: Not on file  Intimate Partner Violence: Not on file     Constitutional: Denies fever, malaise, fatigue, headache or abrupt weight changes.  HEENT: Denies eye pain, eye redness, ear pain, ringing in the ears, wax buildup, runny nose, nasal congestion, bloody nose, or sore throat. Respiratory: Denies difficulty breathing, shortness of breath, cough or sputum production.   Cardiovascular: Pt reports chest tightness, burning sensation in chest. Denies chest pain, palpitations or swelling in the hands or feet.  Gastrointestinal: Denies abdominal pain, bloating, constipation, diarrhea or blood in the stool.  GU: Denies urgency, frequency, pain with urination, burning sensation, blood in urine, odor or discharge. Musculoskeletal: Denies decrease in range of motion,  difficulty with gait, muscle pain or joint pain and swelling.  Skin: Patient reports lump of back.  Denies redness, rashes, lesions or ulcercations.  Neurological: Denies dizziness, difficulty with memory, difficulty with speech or problems with balance and coordination.  Psych: Denies anxiety, depression, SI/HI.  No other specific complaints in a complete review of systems (except as listed in HPI above).  Objective:   Physical Exam  BP 128/84 (BP Location: Right Arm, Patient Position: Sitting, Cuff Size: Normal)   Pulse 72   Temp (!) 96.9 F (36.1 C) (Temporal)   Wt 217 lb (98.4 kg)   SpO2 99%   BMI 32.99 kg/m   Wt Readings from Last 3 Encounters:  10/28/21 218 lb (98.9 kg)  10/07/20 229 lb 6.4 oz (104.1 kg)  08/06/20 237 lb 6.4 oz (107.7 kg)    General: Appears her  stated age, obese, in NAD. Skin: Warm, dry and intact. 5 cm lipoma noted of left upper back in the subscapular region. HEENT: Head: normal shape and size; Eyes: sclera white, no icterus, conjunctiva pink, PERRLA and EOMs intact;  Cardiovascular: Normal rate and rhythm. S1,S2 noted.  No murmur, rubs or gallops noted.  Pulmonary/Chest: Normal effort and positive vesicular breath sounds. No respiratory distress. No wheezes, rales or ronchi noted.  Musculoskeletal: No difficulty with gait.  Neurological: Alert and oriented.   BMET    Component Value Date/Time   NA 140 08/16/2019 0420   K 4.0 08/16/2019 0420   CL 106 08/16/2019 0420   CO2 27 08/16/2019 0420   GLUCOSE 134 (H) 08/16/2019 0420   BUN 11 08/16/2019 0420   CREATININE 0.77 08/16/2019 0420   CREATININE 0.86 10/14/2018 1459   CALCIUM 8.3 (L) 08/16/2019 0420   GFRNONAA >60 08/16/2019 0420   GFRAA >60 08/16/2019 0420    Lipid Panel     Component Value Date/Time   CHOL 119 06/03/2016 1455   TRIG 58.0 06/03/2016 1455   HDL 30.30 (L) 06/03/2016 1455   CHOLHDL 4 06/03/2016 1455   VLDL 11.6 06/03/2016 1455   LDLCALC 77 06/03/2016 1455    CBC     Component Value Date/Time   WBC 13.5 (H) 08/16/2019 0420   RBC 4.32 08/16/2019 0420   HGB 13.1 08/16/2019 0420   HCT 39.8 08/16/2019 0420   PLT 332 08/16/2019 0420   MCV 92.1 08/16/2019 0420   MCH 30.3 08/16/2019 0420   MCHC 32.9 08/16/2019 0420   RDW 12.2 08/16/2019 0420   LYMPHSABS 1.5 08/16/2019 0420   MONOABS 0.5 08/16/2019 0420   EOSABS 0.0 08/16/2019 0420   BASOSABS 0.0 08/16/2019 0420    Hgb A1C Lab Results  Component Value Date  HGBA1C 5.5 10/14/2018           Assessment & Plan:   Motion Sickness:  Rx for Scopolamine patches  Lipoma of Left Upper Back:  Referral to general surgery for further evaluation for surgical removal  Chest Tightness, Burning Sensation in Chest:  Concern for reactive airway disease Referral to pulmonology for PFTs  RTC in 3 months for follow-up of chronic conditions Webb Silversmith, NP

## 2022-02-06 NOTE — Patient Instructions (Signed)
Lipoma  A lipoma is a noncancerous (benign) tumor that is made up of fat cells. This is a very common type of soft-tissue growth. Lipomas are usually found under the skin (subcutaneous). They may occur in any tissue of the body that contains fat. Common areas for lipomas to appear include the back, arms, shoulders, buttocks, and thighs. Lipomas grow slowly, and they are usually painless. Most lipomas do not cause problems and do not require treatment. What are the causes? The cause of this condition is not known. What increases the risk? You are more likely to develop this condition if: You are 40-60 years old. You have a family history of lipomas. What are the signs or symptoms? A lipoma usually appears as a small, round bump under the skin. In most cases, the lump will: Feel soft or rubbery. Not cause pain or other symptoms. However, if a lipoma is located in an area where it pushes on nerves, it can become painful or cause other symptoms. How is this diagnosed? A lipoma can usually be diagnosed with a physical exam. You may also have tests to confirm the diagnosis and to rule out other conditions. Tests may include: Imaging tests, such as a CT scan or an MRI. Removal of a tissue sample to be looked at under a microscope (biopsy). How is this treated? Treatment for this condition depends on the size of the lipoma and whether it is causing any symptoms. For small lipomas that are not causing problems, no treatment is needed. If a lipoma is bigger or it causes problems, surgery may be done to remove the lipoma. Lipomas can also be removed to improve appearance. Most often, the procedure is done after applying a medicine that numbs the area (local anesthetic). Liposuction may be done to reduce the size of the lipoma before it is removed through surgery, or it may be done to remove the lipoma. Lipomas are removed with this method to limit incision size and scarring. A liposuction tube is  inserted through a small incision into the lipoma, and the contents of the lipoma are removed through the tube with suction. Follow these instructions at home: Watch your lipoma for any changes. Keep all follow-up visits. This is important. Where to find more information OrthoInfo: orthoinfo.aaos.org Contact a health care provider if: Your lipoma becomes larger or hard. Your lipoma becomes painful, red, or increasingly swollen. These could be signs of infection or a more serious condition. Get help right away if: You develop tingling or numbness in an area near the lipoma. This could indicate that the lipoma is causing nerve damage. Summary A lipoma is a noncancerous tumor that is made up of fat cells. Most lipomas do not cause problems and do not require treatment. If a lipoma is bigger or it causes problems, surgery may be done to remove the lipoma. Contact a health care provider if your lipoma becomes larger or hard, or if it becomes painful, red, or increasingly swollen. These could be signs of infection or a more serious condition. This information is not intended to replace advice given to you by your health care provider. Make sure you discuss any questions you have with your health care provider. Document Revised: 03/28/2021 Document Reviewed: 03/28/2021 Elsevier Patient Education  2023 [employer]  

## 2022-02-10 ENCOUNTER — Ambulatory Visit (INDEPENDENT_AMBULATORY_CARE_PROVIDER_SITE_OTHER): Payer: BC Managed Care – PPO | Admitting: Surgery

## 2022-02-10 ENCOUNTER — Encounter: Payer: Self-pay | Admitting: Surgery

## 2022-02-10 VITALS — BP 135/85 | HR 58 | Temp 98.1°F | Ht 68.0 in | Wt 218.4 lb

## 2022-02-10 DIAGNOSIS — M7989 Other specified soft tissue disorders: Secondary | ICD-10-CM

## 2022-02-10 DIAGNOSIS — R222 Localized swelling, mass and lump, trunk: Secondary | ICD-10-CM

## 2022-02-10 NOTE — Progress Notes (Unsigned)
Patient ID: Carrie Thompson, female   DOB: [birth year], 31 y.o.   MRN: [id number]  Chief Complaint: 3-week history of lump on medial left back  History of Present Illness Carrie Thompson is a 31 y.o. female with she denies any history of drainage, the pain is a 3 out of 10.  She insists it was not there a month ago.  She denies any fevers or chills.  She has an upcoming trip and will have significant sun exposure, and wants to defer excision of any lesion until after that.  Past Medical History Past Medical History:  Diagnosis Date   Abnormal Pap smear    Anxiety    Chicken pox    Depression    Hx of varicella    Seizures (HCC)    hx pseudotumor treated in 2011 with meds, never had seizure   Vaginal Pap smear, abnormal       Past Surgical History:  Procedure Laterality Date   LAPAROSCOPIC GASTRIC SLEEVE RESECTION N/A 08/15/2019   Procedure: LAPAROSCOPIC GASTRIC SLEEVE RESECTION, Upper Endo, ERAS Pathway;  Surgeon: Clovis Riley, MD;  Location: [facility];  Service: General;  Laterality: N/A;    Allergies  Allergen Reactions   Shellfish Allergy Anaphylaxis   Iodine Other (See Comments)    Pt states that she avoids iodine because of her reaction to shellfish.      Current Outpatient Medications  Medication Sig Dispense Refill   amphetamine-dextroamphetamine (ADDERALL) 30 MG tablet Take 1 tablet by mouth 2 (two) times daily. 60 tablet 0   EPINEPHrine 0.3 mg/0.3 mL IJ SOAJ injection Inject 0.3 mg into the muscle as needed for anaphylaxis. 1 each 0   levonorgestrel (MIRENA, 52 MG,) 20 MCG/DAY IUD Mirena 20 mcg/24 hours (8 yrs) 52 mg intrauterine device  Take 1 device by intrauterine route.     scopolamine (TRANSDERM-SCOP) 1 MG/3DAYS Place 1 patch (1.5 mg total) onto the skin every 3 (three) days. For motion sickness. Start 2 hr before onset symptoms, up to 12 hr before 10 patch 0   zolpidem (AMBIEN) 10 MG tablet Take 1 tablet (10 mg total) by mouth at bedtime as needed for sleep. 30  tablet 0   No current facility-administered medications for this visit.    Family History Family History  Problem Relation Age of Onset   Other Mother        varicose veins   Varicose Veins Mother    Mental illness Mother    Diabetes Father    Mental illness Father    Rheum arthritis Maternal Grandmother    Hypertension Maternal Grandfather       Social History Social History   Tobacco Use   Smoking status: Never   Smokeless tobacco: Never  Vaping Use   Vaping Use: Never used  Substance Use Topics   Alcohol use: No    Alcohol/week: 0.0 standard drinks of alcohol   Drug use: No        Review of Systems  Constitutional: Negative.   HENT: Negative.    Eyes: Negative.   Respiratory: Negative.    Cardiovascular: Negative.   Gastrointestinal: Negative.   Genitourinary: Negative.   Skin: Negative.   Neurological: Negative.   Psychiatric/Behavioral: Negative.       Physical Exam Blood pressure 135/85, pulse (!) 58, temperature 98.1 F (36.7 C), temperature source Oral, height '5\' 8"'$  (1.727 m), weight 218 lb 6.4 oz (99.1 kg). Last Weight  Most recent update: 02/10/2022  3:51 PM  Weight  99.1 kg (218 lb 6.4 oz)             CONSTITUTIONAL: Well developed, and nourished, appropriately responsive and aware without distress. ***  EYES: Sclera non-icteric.   EARS, NOSE, MOUTH AND THROAT: Mask worn.  *** The oropharynx is clear. Oral mucosa is pink and moist.  Dentition: ***   Hearing is intact to voice.  NECK: Trachea is midline, and there is no jugular venous distension.  LYMPH NODES:  Lymph nodes in the neck are not appreciated. RESPIRATORY:  Lungs are clear, and breath sounds are equal bilaterally. Normal respiratory effort without pathologic use of accessory muscles. CARDIOVASCULAR: Heart is regular in rate and rhythm.  Well perfused.  GI: The abdomen is *** soft, nontender, and nondistended. There were no palpable masses. I did not appreciate  hepatosplenomegaly. There were normal bowel sounds. GU: *** MUSCULOSKELETAL:  Symmetrical muscle tone appreciated in all four extremities.    SKIN: Skin turgor is normal. No pathologic skin lesions appreciated.  NEUROLOGIC:  Motor and sensation appear grossly normal.  Cranial nerves are grossly without defect. PSYCH:  Alert and oriented to person, place and time. Affect is appropriate for situation.  Data Reviewed I have personally reviewed what is currently available of the patient's imaging, recent labs and medical records.   Labs:     Latest Ref Rng & Units 08/16/2019    4:20 AM 08/08/2019    3:38 PM 10/14/2018    2:59 PM  CBC  WBC 4.0 - 10.5 K/uL 13.5  10.3  9.9   Hemoglobin 12.0 - 15.0 g/dL 13.1  13.0  14.0   Hematocrit 36.0 - 46.0 % 39.8  39.5  40.0   Platelets 150 - 400 K/uL 332  309  315       Latest Ref Rng & Units 08/16/2019    4:20 AM 08/08/2019    3:38 PM 10/14/2018    2:59 PM  CMP  Glucose 70 - 99 mg/dL 134  129  106   BUN 6 - 20 mg/dL '11  14  12   '$ Creatinine 0.44 - 1.00 mg/dL 0.77  0.88  0.86   Sodium 135 - 145 mmol/L 140  139  138   Potassium 3.5 - 5.1 mmol/L 4.0  3.6  4.3   Chloride 98 - 111 mmol/L 106  104  103   CO2 22 - 32 mmol/L '27  25  26   '$ Calcium 8.9 - 10.3 mg/dL 8.3  8.6  9.0   Total Protein 6.5 - 8.1 g/dL 6.6  6.6  6.4   Total Bilirubin 0.3 - 1.2 mg/dL 0.9  0.9  0.4   Alkaline Phos 38 - 126 U/L 78  82    AST 15 - 41 U/L '21  20  19   '$ ALT 0 - 44 U/L '27  23  21    '$ *** {Labs :18171}  Imaging: Radiological images reviewed:  *** Within last 24 hrs: No results found.  Assessment    *** Patient Active Problem List   Diagnosis Date Noted   Insomnia 10/07/2020   Class 1 obesity due to excess calories with body mass index (BMI) of 33.0 to 33.9 in adult 10/07/2020   ADD (attention deficit disorder) 01/06/2016   Anxiety and depression 10/10/2014   Pseudotumor cerebri 10/10/2014    Plan    ***  Face-to-face time spent with the patient and  accompanying care providers(if present) was *** minutes, with more than 50% of the time  spent counseling, educating, and coordinating care of the patient.    These notes generated with voice recognition software. I apologize for typographical errors.  Ronny Bacon M.D., FACS 02/10/2022, 4:03 PM

## 2022-02-10 NOTE — Patient Instructions (Signed)
If you have any concerns or questions, please feel free to call our office. See follow up appointment below.   Lipoma  A lipoma is a noncancerous (benign) tumor that is made up of fat cells. This is a very common type of soft-tissue growth. Lipomas are usually found under the skin (subcutaneous). They may occur in any tissue of the body that contains fat. Common areas for lipomas to appear include the back, arms, shoulders, buttocks, and thighs. Lipomas grow slowly, and they are usually painless. Most lipomas do not cause problems and do not require treatment. What are the causes? The cause of this condition is not known. What increases the risk? You are more likely to develop this condition if: You are 40-60 years old. You have a family history of lipomas. What are the signs or symptoms? A lipoma usually appears as a small, round bump under the skin. In most cases, the lump will: Feel soft or rubbery. Not cause pain or other symptoms. However, if a lipoma is located in an area where it pushes on nerves, it can become painful or cause other symptoms. How is this diagnosed? A lipoma can usually be diagnosed with a physical exam. You may also have tests to confirm the diagnosis and to rule out other conditions. Tests may include: Imaging tests, such as a CT scan or an MRI. Removal of a tissue sample to be looked at under a microscope (biopsy). How is this treated? Treatment for this condition depends on the size of the lipoma and whether it is causing any symptoms. For small lipomas that are not causing problems, no treatment is needed. If a lipoma is bigger or it causes problems, surgery may be done to remove the lipoma. Lipomas can also be removed to improve appearance. Most often, the procedure is done after applying a medicine that numbs the area (local anesthetic). Liposuction may be done to reduce the size of the lipoma before it is removed through surgery, or it may be done to remove  the lipoma. Lipomas are removed with this method to limit incision size and scarring. A liposuction tube is inserted through a small incision into the lipoma, and the contents of the lipoma are removed through the tube with suction. Follow these instructions at home: Watch your lipoma for any changes. Keep all follow-up visits. This is important. Where to find more information OrthoInfo: orthoinfo.aaos.org Contact a health care provider if: Your lipoma becomes larger or hard. Your lipoma becomes painful, red, or increasingly swollen. These could be signs of infection or a more serious condition. Get help right away if: You develop tingling or numbness in an area near the lipoma. This could indicate that the lipoma is causing nerve damage. Summary A lipoma is a noncancerous tumor that is made up of fat cells. Most lipomas do not cause problems and do not require treatment. If a lipoma is bigger or it causes problems, surgery may be done to remove the lipoma. Contact a health care provider if your lipoma becomes larger or hard, or if it becomes painful, red, or increasingly swollen. These could be signs of infection or a more serious condition. This information is not intended to replace advice given to you by your health care provider. Make sure you discuss any questions you have with your health care provider. Document Revised: 03/28/2021 Document Reviewed: 03/28/2021 Elsevier Patient Education  2023 Elsevier Inc.  

## 2022-02-16 ENCOUNTER — Other Ambulatory Visit: Payer: Self-pay | Admitting: Internal Medicine

## 2022-02-16 MED ORDER — ZOLPIDEM TARTRATE 10 MG PO TABS: 10.0000 mg | ORAL_TABLET | Freq: Every evening | ORAL | 0 refills | Status: DC | PRN

## 2022-03-02 ENCOUNTER — Institutional Professional Consult (permissible substitution): Payer: BC Managed Care – PPO | Admitting: Student in an Organized Health Care Education/Training Program

## 2022-03-03 ENCOUNTER — Encounter: Payer: Self-pay | Admitting: Surgery

## 2022-03-03 ENCOUNTER — Other Ambulatory Visit: Payer: Self-pay

## 2022-03-03 ENCOUNTER — Ambulatory Visit (INDEPENDENT_AMBULATORY_CARE_PROVIDER_SITE_OTHER): Payer: BC Managed Care – PPO | Admitting: Surgery

## 2022-03-03 VITALS — BP 120/86 | HR 78 | Temp 98.1°F | Ht 68.0 in | Wt 216.0 lb

## 2022-03-03 DIAGNOSIS — D171 Benign lipomatous neoplasm of skin and subcutaneous tissue of trunk: Secondary | ICD-10-CM

## 2022-03-03 DIAGNOSIS — M7989 Other specified soft tissue disorders: Secondary | ICD-10-CM

## 2022-03-03 NOTE — Patient Instructions (Addendum)
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 Please see information below regarding this type of tumor.  You are free to shower tomorrow. Do not scrub over the area.  You have glue on your skin and sutures under the skin. The glue will come off on it's own in 10-14 days. You may shower normally until this occurs but do not submerge.  Please use Tylenol or Ibuprofen for pain as needed. You may use ice to the area 3-4 times today and tomorrow for any achiness.   We will see you back in 2 weeks to ensure that this has healed and to review the final pathology. Please see your appointment below. You may continue your regular activities right away but if you are having pain while doing something, stop what you are doing and try this activity once again in 3 days. Please call our office with any questions or concerns prior to your appointment.   Lipoma Removal Lipoma removal is a surgical procedure to remove a noncancerous (benign) tumor that is made up of fat cells (lipoma). Most lipomas are small and painless and do not require treatment. They can form in many areas of the body but are most common under the skin of the back, shoulders, arms, and thighs. You may need lipoma removal if you have a lipoma that is large, growing, or causing discomfort. Lipoma removal may also be done for cosmetic reasons. Tell a health care provider about: Any allergies you have. All medicines you are taking, including vitamins, herbs, eye drops, creams, and over-the-counter medicines. Any problems you or family members have had with anesthetic medicines. Any blood disorders you have. Any surgeries you have had. Any medical conditions you have. Whether you are pregnant or may be pregnant. What are the risks? Generally, this is a safe procedure. However, problems may occur, including: Infection. Bleeding. Allergic reactions to medicines. Damage to nerves or blood vessels near the lipoma. Scarring.  Medicines Ask  your health care provider about: Changing or stopping your regular medicines. This is especially important if you are taking diabetes medicines or blood thinners. Taking medicines such as aspirin and ibuprofen. These medicines can thin your blood. Do not take these medicines before your procedure if your health care provider instructs you not to. You may be given antibiotic medicine to help prevent infection. General instructions Ask your health care provider how your surgical site will be marked or identified. You will have a physical exam. Your health care provider will check the size of the lipoma and whether it can be moved easily.  What happens during the procedure? To reduce your risk of infection: Your health care team will wash or sanitize their hands. Your skin will be washed with soap. You will be given the following: A medicine to numb the area (local anesthetic). An incision will be made over the lipoma or very near the lipoma. The incision may be made in a natural skin line or crease. Tissues, nerves, and blood vessels near the lipoma will be moved out of the way. The lipoma and the capsule that surrounds it will be separated from the surrounding tissues. The lipoma will be removed. The incision may be closed with stitches (sutures). A bandage (dressing) will be placed over the incision.

## 2022-03-03 NOTE — Progress Notes (Signed)
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    Surgeon: Ronny Bacon, M.D., FACS  Anesthesia: Local   Findings: Thought to be greater than 4 cm, measured largest diameter 3.3 cm  Estimated Blood Loss: 3 mL         Specimens: Consistent with benign with mature lipoma.  Discarded.          Complications: none              Procedure Details  The patient was evaluated, the benefits, complications, treatment options, and expected outcomes were discussed with the patient. The risks of bleeding, infection, recurrence of symptoms, failure to resolve symptoms, unanticipated injury, any of which could require further surgery were reviewed with the patient. The likelihood of improving the patient's symptoms with return to their baseline status is expected.  The patient and/or family concurred with the proposed plan, giving informed consent.  The patient was taken to our procedure room, identified and the procedure verified.    The patient was positioned in the prone position and the left parascapular area was prepped with Chloraprep and draped in the sterile fashion.  A Time Out was held and the above information confirmed.  Local infiltration 1% lidocaine with epinephrine is applied to the medial parascapular skin line, oriented parallel to the spinal access.  Incision was then made through this area and carried down through subcutaneous tissues, eventually incising the deep fascia, I was then able to release the well-defined lipoma within this fascial plane.  It was then excised from the deep muscular fascia, and removed as complete specimen.  Hemostasis obtained with pressure, there was no vascular bleeding whatsoever.  Incision was then closed with interrupted dermal sutures of 3-0 Vicryl, followed by running 4-0 Monocryl subcuticular. Multiple cold of Dermabond applied.  Your incision was closed with  Dermabond.  It is best to keep it clean and dry, it will tolerate a brief shower, but do not soak it or apply any creams or lotions to the incisions.  The Dermabond should gradually flake off over time.  Keep it open to air so you can evaluate your incisions.  Dermabond assists the underlying sutures to keep your incision closed and protected from infection.  Should you develop some drainage from your incision, some drops of drainage would be okay but if it persists continue to put keep a dry dressing over it.

## 2022-03-10 DIAGNOSIS — M24152 Other articular cartilage disorders, left hip: Secondary | ICD-10-CM | POA: Diagnosis not present

## 2022-03-10 DIAGNOSIS — M25552 Pain in left hip: Secondary | ICD-10-CM | POA: Diagnosis not present

## 2022-03-10 DIAGNOSIS — Z9889 Other specified postprocedural states: Secondary | ICD-10-CM | POA: Diagnosis not present

## 2022-03-10 DIAGNOSIS — M25852 Other specified joint disorders, left hip: Secondary | ICD-10-CM | POA: Diagnosis not present

## 2022-03-10 DIAGNOSIS — M21852 Other specified acquired deformities of left thigh: Secondary | ICD-10-CM | POA: Diagnosis not present

## 2022-03-11 NOTE — Progress Notes (Unsigned)
[facility] POST-OP OFFICE VISIT  03/12/2022  HPI: Carrie Thompson is a 31 y.o. female 9 days s/p excision of left parascapular lipoma.  She reports she is doing well.  She is avoided weight lifting in the interim.  Vital signs: BP 123/81   Pulse (!) 54   Temp 98 F (36.7 C) (Oral)   Ht '5\' 8"'$  (1.727 m)   Wt 217 lb (98.4 kg)   BMI 32.99 kg/m    Physical Exam: Constitutional: Appears well  Skin: Dermabond peeling away, incision clean dry and intact.  Slightly raised from the surrounding skin and soft tissues but not unexpected.  Assessment/Plan: This is a 31 y.o. female 9 days s/p excision of lipoma.  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  Protect from sun.  Follow-up as needed.   Ronny Bacon M.D., FACS 03/12/2022, 1:36 PM

## 2022-03-12 ENCOUNTER — Encounter: Payer: Self-pay | Admitting: Surgery

## 2022-03-12 ENCOUNTER — Other Ambulatory Visit: Payer: Self-pay

## 2022-03-12 ENCOUNTER — Encounter: Payer: BC Managed Care – PPO | Admitting: Surgery

## 2022-03-12 ENCOUNTER — Ambulatory Visit (INDEPENDENT_AMBULATORY_CARE_PROVIDER_SITE_OTHER): Payer: BC Managed Care – PPO | Admitting: Surgery

## 2022-03-12 VITALS — BP 123/81 | HR 54 | Temp 98.0°F | Ht 68.0 in | Wt 217.0 lb

## 2022-03-12 DIAGNOSIS — D171 Benign lipomatous neoplasm of skin and subcutaneous tissue of trunk: Secondary | ICD-10-CM

## 2022-03-12 DIAGNOSIS — Z09 Encounter for follow-up examination after completed treatment for conditions other than malignant neoplasm: Secondary | ICD-10-CM

## 2022-03-12 DIAGNOSIS — M7989 Other specified soft tissue disorders: Secondary | ICD-10-CM

## 2022-03-12 NOTE — Patient Instructions (Signed)
Please call the office with any questions or concerns.

## 2022-03-19 ENCOUNTER — Other Ambulatory Visit: Payer: Self-pay | Admitting: Internal Medicine

## 2022-03-19 IMAGING — MR MR HIP*R* W/CM
4 of 6 series · 26 of 40 positions shown · IV contrast (agent unspecified)
Comparison: X-ray 05/15/2020

CLINICAL DATA: Chronic right hip pain

EXAM:
MRI OF THE RIGHT HIP WITH CONTRAST (MR Arthrogram)
TECHNIQUE: Multiplanar, multisequence MR imaging of the hip was performed
immediately following contrast injection into the hip joint under
fluoroscopic guidance. No intravenous contrast was administered.

[Series 5: T1 · coronal · 4.0mm · 0.56mm/px · 5 of 25 slices shown]
[im 1/25]
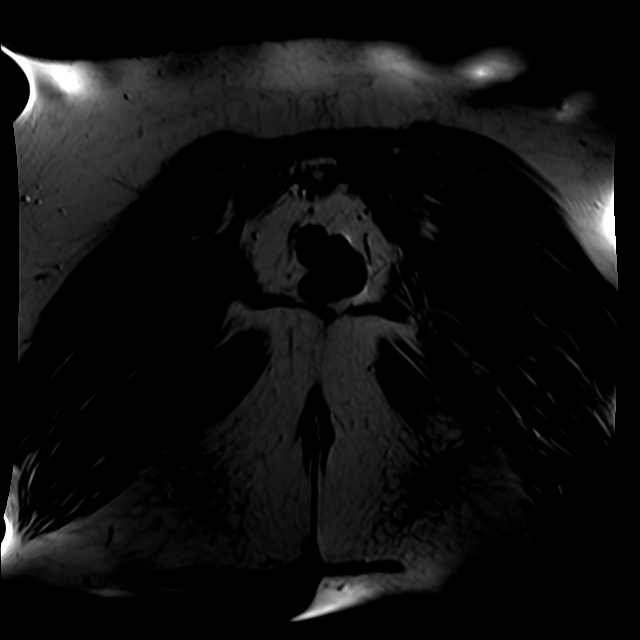
[im 7/25]
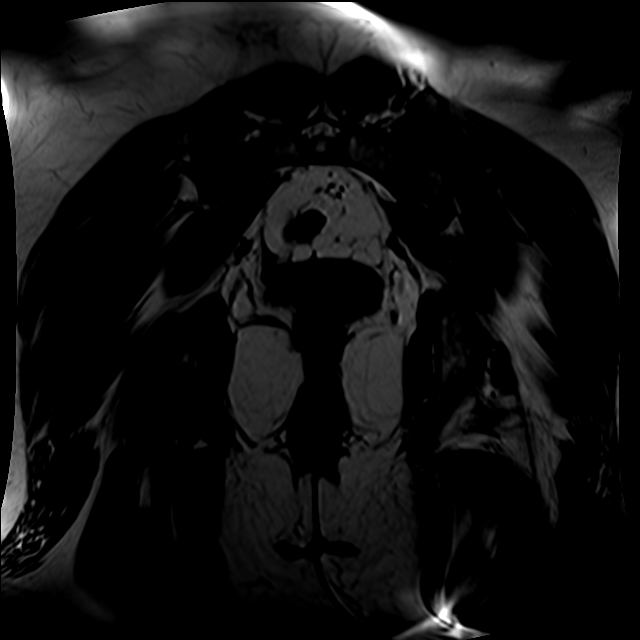
[im 13/25]
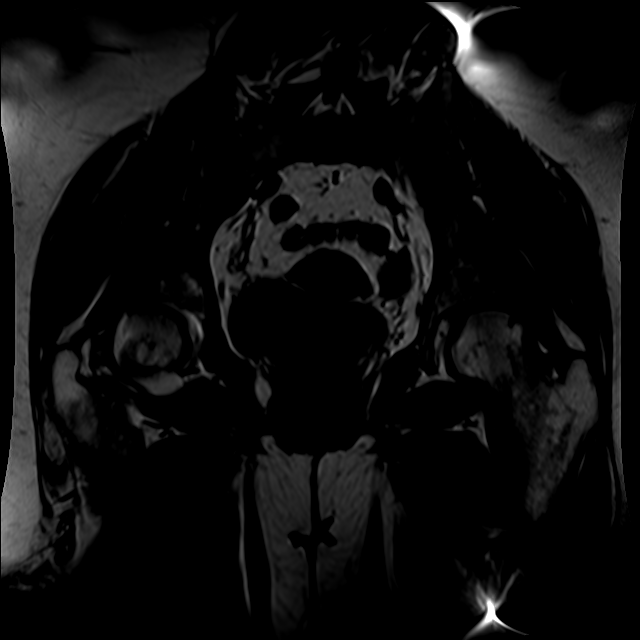
[im 19/25]
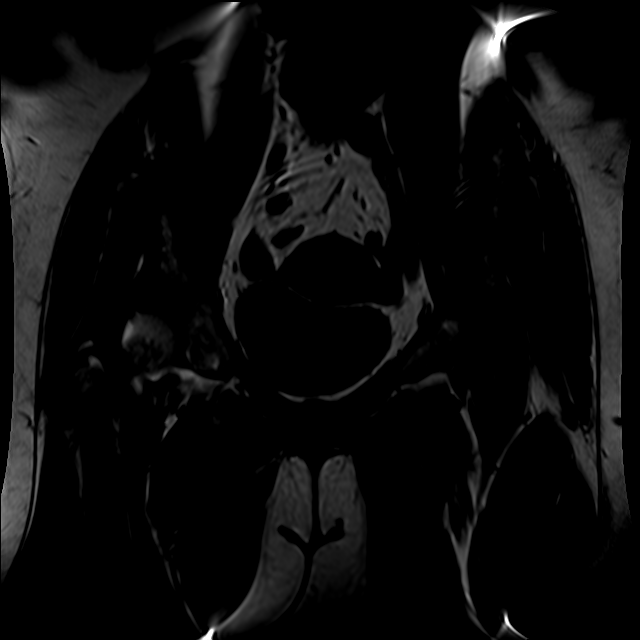
[im 25/25]
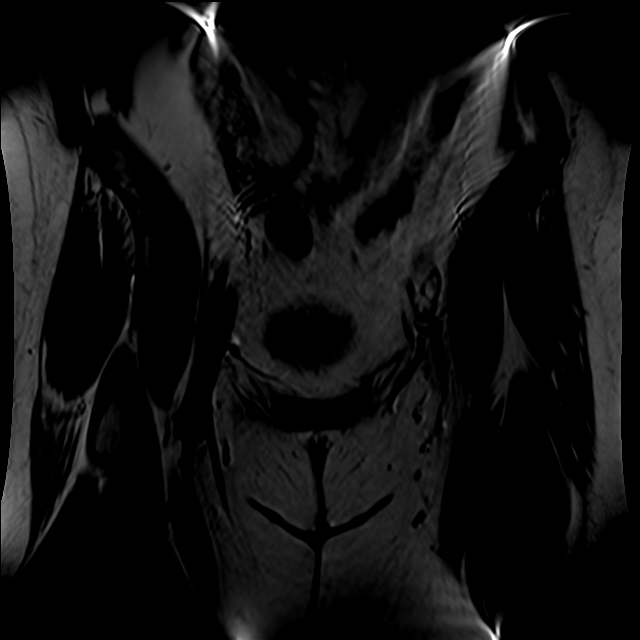

[Series 6: T2 fat-sat · coronal · 4.0mm · 1.12mm/px · 6 of 25 slices shown (1 of 2)]
[im 1/25]
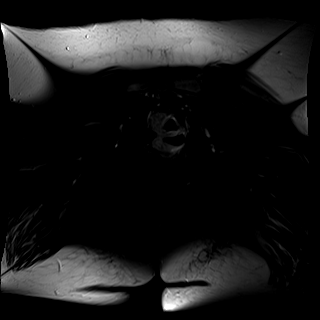
[im 5/25]
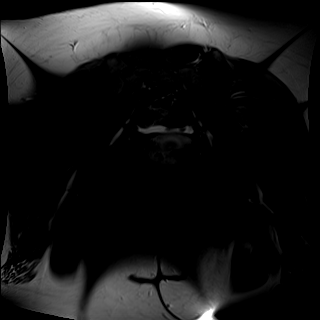
[im 10/25]
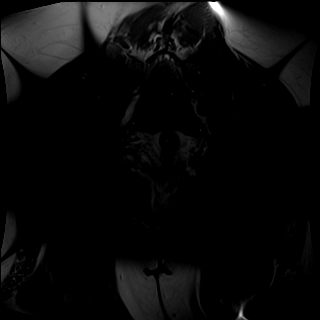
[im 15/25]
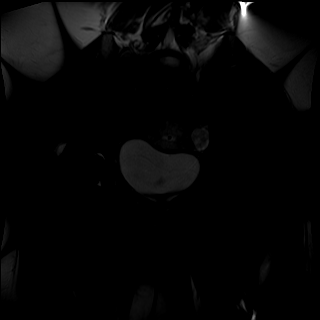
[im 20/25]
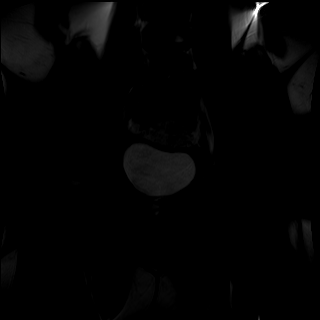
[im 25/25]
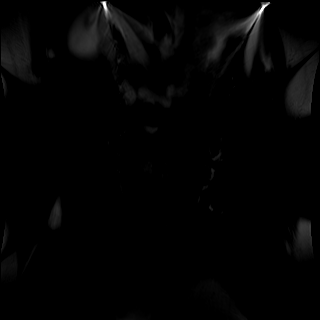

[Series 7: T2 fat-sat · axial · 4.0mm · 0.74mm/px · z∈[-175,-5]mm · 8 of 35 slices shown (2 of 2)]
[im 1/35]
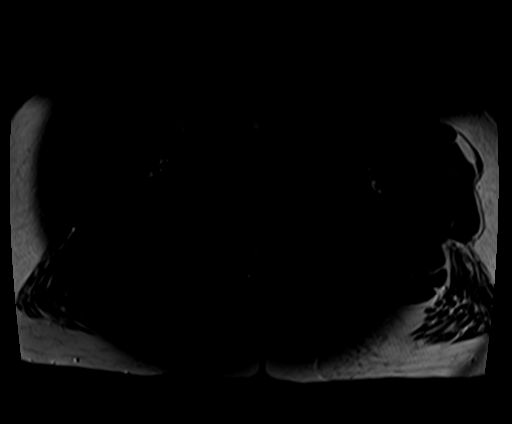
[im 5/35]
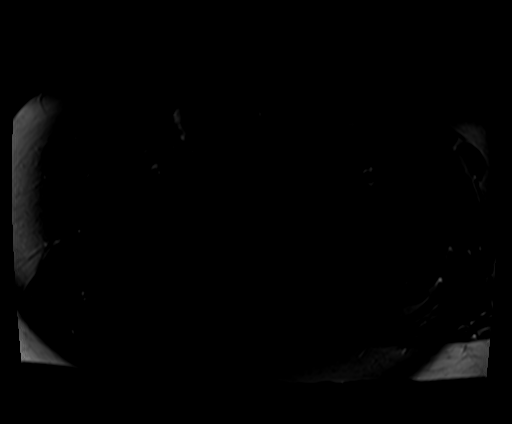
[im 10/35]
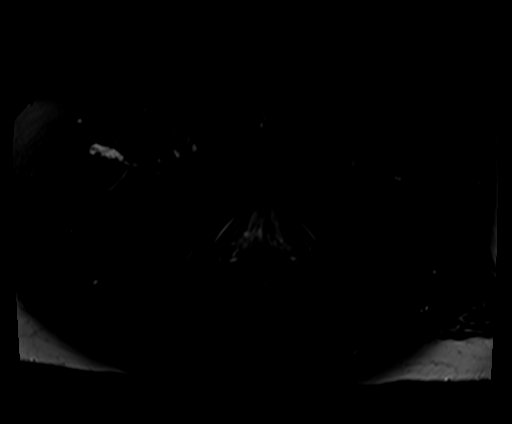
[im 15/35]
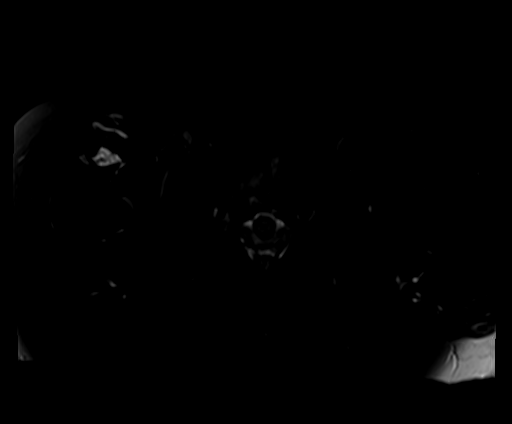
[im 20/35]
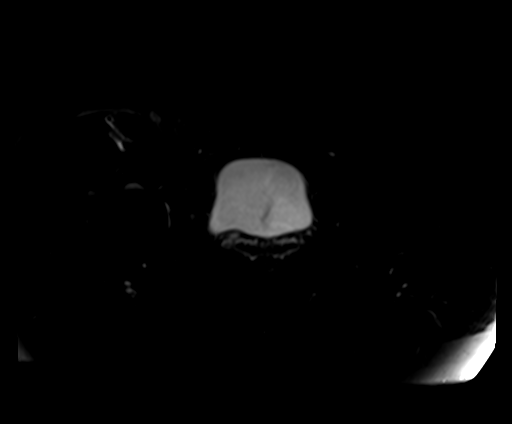
[im 25/35]
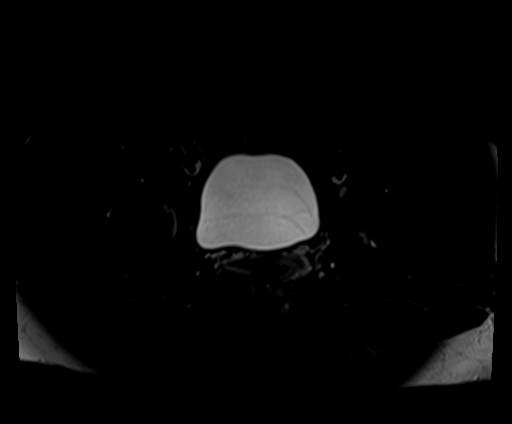
[im 30/35]
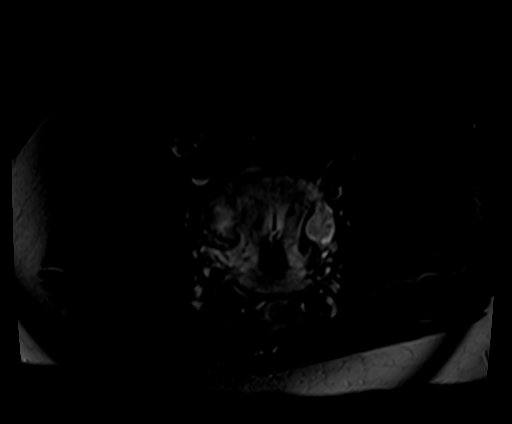
[im 35/35]
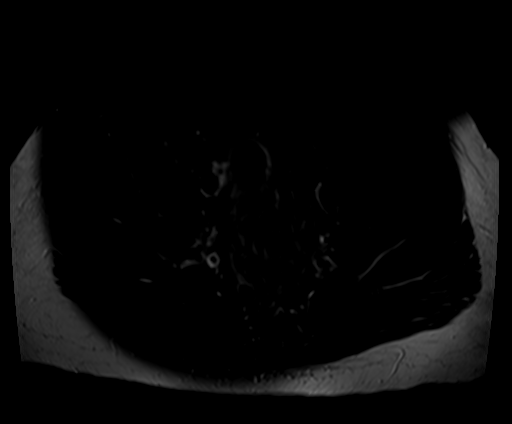

[Series 8: T1 fat-sat · axial · 4.0mm · 0.70mm/px · z∈[-189,-68]mm · 7 of 31 slices shown]
[im 1/31]
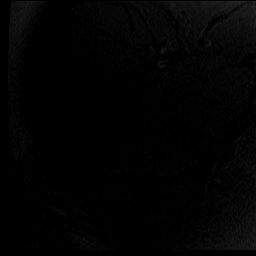
[im 6/31]
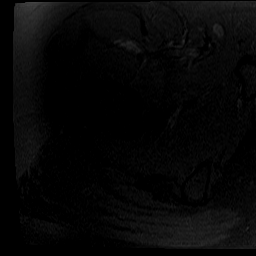
[im 11/31]
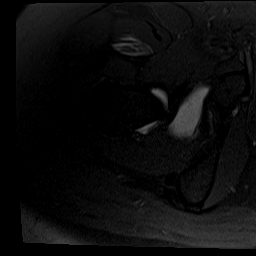
[im 16/31]
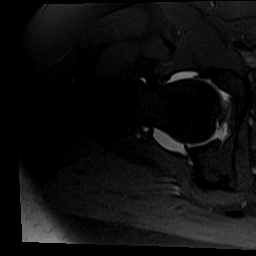
[im 21/31]
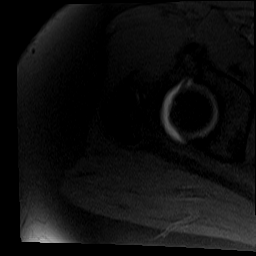
[im 26/31]
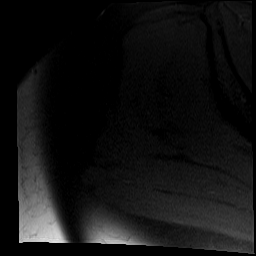
[im 31/31]
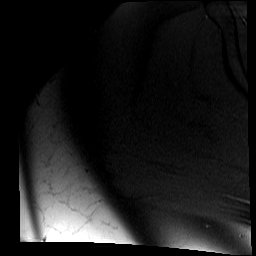

[26 of 40 positions shown; findings below may reference images not displayed]

FINDINGS: Bones: No acute fracture. No dislocation. No femoral head avascular
necrosis. Normal offset of the femoral head-neck junction. Increased
T2 signal within the subchondral bone of the parasymphyseal aspects
of the bilateral pubic bones (series 7, image 23) pubic symphysis
intact without diastasis. No subchondral fracture. SI joints appear
intact and unremarkable. No marrow replacing bone lesion.

Articular cartilage and labrum

Articular cartilage: No chondral defect. No subchondral marrow
signal changes.

Labrum: High-grade tearing of the superior labrum including
full-thickness component at the anterosuperior labrum (series 10,
images 21-23; series 8, image 13). Lobulated T2 hyperintense
structure along the lateral margin of the superior acetabulum
measuring 1.7 x 0.9 x 1.5 cm, likely paralabral cyst (series 6,
images 15-16).

Joint or bursal effusion

Joint effusion: Joint is well distended with injected contrast.

Bursae: No abnormal bursal fluid collection.

Muscles and tendons

Muscles and tendons: The gluteal, hamstring, iliopsoas, rectus
femoris, and adductor tendons appear intact without tear or
significant tendinosis. Normal muscle bulk and signal intensity
without edema, atrophy, or fatty infiltration.

Other findings

Miscellaneous: No inguinal lymphadenopathy. IUD is noted within the
endometrial canal. No acute findings within the pelvis.
IMPRESSION: 1. High-grade tearing of the superior labrum with adjacent 1.7 cm
paralabral cyst.
2. No cartilage abnormality of the right hip.
3. Increased T2 signal within the parasymphyseal aspects of the
bilateral pubic bones, nonspecific but may reflect osteitis pubis.

## 2022-03-20 MED ORDER — ZOLPIDEM TARTRATE 10 MG PO TABS: 10.0000 mg | ORAL_TABLET | Freq: Every evening | ORAL | 0 refills | Status: DC | PRN

## 2022-03-31 ENCOUNTER — Encounter: Payer: Self-pay | Admitting: Internal Medicine

## 2022-04-03 DIAGNOSIS — J45909 Unspecified asthma, uncomplicated: Secondary | ICD-10-CM | POA: Diagnosis not present

## 2022-04-03 DIAGNOSIS — Z975 Presence of (intrauterine) contraceptive device: Secondary | ICD-10-CM | POA: Diagnosis not present

## 2022-04-03 DIAGNOSIS — Z903 Acquired absence of stomach [part of]: Secondary | ICD-10-CM | POA: Diagnosis not present

## 2022-04-03 DIAGNOSIS — F419 Anxiety disorder, unspecified: Secondary | ICD-10-CM | POA: Diagnosis not present

## 2022-04-03 DIAGNOSIS — F32A Depression, unspecified: Secondary | ICD-10-CM | POA: Diagnosis not present

## 2022-04-03 DIAGNOSIS — G932 Benign intracranial hypertension: Secondary | ICD-10-CM | POA: Diagnosis not present

## 2022-04-03 DIAGNOSIS — Q6589 Other specified congenital deformities of hip: Secondary | ICD-10-CM | POA: Diagnosis not present

## 2022-04-03 DIAGNOSIS — Z01818 Encounter for other preprocedural examination: Secondary | ICD-10-CM | POA: Diagnosis not present

## 2022-04-06 ENCOUNTER — Ambulatory Visit: Payer: BC Managed Care – PPO | Admitting: Student in an Organized Health Care Education/Training Program

## 2022-04-06 ENCOUNTER — Encounter: Payer: Self-pay | Admitting: Student in an Organized Health Care Education/Training Program

## 2022-04-06 VITALS — BP 120/68 | HR 60 | Temp 98.0°F | Ht 68.0 in | Wt 220.0 lb

## 2022-04-06 DIAGNOSIS — J309 Allergic rhinitis, unspecified: Secondary | ICD-10-CM | POA: Diagnosis not present

## 2022-04-06 DIAGNOSIS — J45909 Unspecified asthma, uncomplicated: Secondary | ICD-10-CM | POA: Diagnosis not present

## 2022-04-06 DIAGNOSIS — R942 Abnormal results of pulmonary function studies: Secondary | ICD-10-CM

## 2022-04-06 MED ORDER — ALBUTEROL SULFATE HFA 108 (90 BASE) MCG/ACT IN AERS: 2.0000 | INHALATION_SPRAY | Freq: Four times a day (QID) | RESPIRATORY_TRACT | 2 refills | Status: AC | PRN

## 2022-04-06 MED ORDER — FLUTICASONE PROPIONATE 50 MCG/ACT NA SUSP: 1.0000 | Freq: Every day | NASAL | 11 refills | Status: AC

## 2022-04-06 MED ORDER — DESLORATADINE 5 MG PO TABS: 5.0000 mg | ORAL_TABLET | Freq: Every day | ORAL | 11 refills | Status: AC

## 2022-04-06 NOTE — Patient Instructions (Signed)
Today, I ordered blood work. You can get them draw at your preferred [facility] draw station. The nearest one to [facility] is at nearby [place] ([address], [place], [place] [address]).

## 2022-04-06 NOTE — Progress Notes (Signed)
Synopsis: Referred in for positive MIC by Jearld Fenton, NP  Assessment & Plan:   #Positive methacholine challenge #History of childhood asthma #Allergic Rhinitis  32 year old healthy female presenting for the evaluation of a methacholine with associated allergies and a history of distant asthma as a child. While many patients will experience a positive methacholine challenge test without ever developing asthma, Carrie Thompson's symptoms of allergies, shortness of breath, and chest tightness are concerning for a mild asthma variant. I will obtain a pulmonary function test to evaluate spirometry, and will order an allergen panel and CBC w/ differential to assess for a T2 helper cell type response. I will send a prescription for a second generation anti-histamine as well as intra-nasal steroids to aid in her nasal congestion. Finally, I will prescribe albuterol PRN while we await the results from the PFT's.  - Allergen Panel (27) + IGE - CBC with Differential/Platelet - desloratadine (CLARINEX) 5 MG tablet; Take 1 tablet (5 mg total) by mouth daily.  Dispense: 30 tablet; Refill: 11 - fluticasone (FLONASE) 50 MCG/ACT nasal spray; Place 1 spray into both nostrils daily.  Dispense: 18.2 mL; Refill: 11 - albuterol (VENTOLIN HFA) 108 (90 Base) MCG/ACT inhaler; Inhale 2 puffs into the lungs every 6 (six) hours as needed for wheezing or shortness of breath.  Dispense: 8 g; Refill: 2 - Pulmonary Function Test [facility] Only; Future   Return in about 3 months (around 07/06/2022).  I spent 60 minutes caring for this patient today, including preparing to see the patient, obtaining a medical history , reviewing a separately obtained history, performing a medically appropriate examination and/or evaluation, counseling and educating the patient/family/caregiver, ordering medications, tests, or procedures, documenting clinical information in the electronic health record, and independently interpreting results (not  separately reported/billed) and communicating results to the patient/family/caregiver  Armando Reichert, MD [facility] 04/06/2022 2:36 PM    End of visit medications:  Meds ordered this encounter  Medications   desloratadine (CLARINEX) 5 MG tablet    Sig: Take 1 tablet (5 mg total) by mouth daily.    Dispense:  30 tablet    Refill:  11   fluticasone (FLONASE) 50 MCG/ACT nasal spray    Sig: Place 1 spray into both nostrils daily.    Dispense:  18.2 mL    Refill:  11   albuterol (VENTOLIN HFA) 108 (90 Base) MCG/ACT inhaler    Sig: Inhale 2 puffs into the lungs every 6 (six) hours as needed for wheezing or shortness of breath.    Dispense:  8 g    Refill:  2     Current Outpatient Medications:    albuterol (VENTOLIN HFA) 108 (90 Base) MCG/ACT inhaler, Inhale 2 puffs into the lungs every 6 (six) hours as needed for wheezing or shortness of breath., Disp: 8 g, Rfl: 2   amphetamine-dextroamphetamine (ADDERALL) 30 MG tablet, Take 1 tablet by mouth 2 (two) times daily., Disp: 60 tablet, Rfl: 0   desloratadine (CLARINEX) 5 MG tablet, Take 1 tablet (5 mg total) by mouth daily., Disp: 30 tablet, Rfl: 11   EPINEPHrine 0.3 mg/0.3 mL IJ SOAJ injection, Inject 0.3 mg into the muscle as needed for anaphylaxis., Disp: 1 each, Rfl: 0   fluticasone (FLONASE) 50 MCG/ACT nasal spray, Place 1 spray into both nostrils daily., Disp: 18.2 mL, Rfl: 11   levonorgestrel (MIRENA, 52 MG,) 20 MCG/DAY IUD, Mirena 20 mcg/24 hours (8 yrs) 52 mg intrauterine device  Take 1 device by  intrauterine route., Disp: , Rfl:    scopolamine (TRANSDERM-SCOP) 1 MG/3DAYS, Place 1 patch (1.5 mg total) onto the skin every 3 (three) days. For motion sickness. Start 2 hr before onset symptoms, up to 12 hr before, Disp: 10 patch, Rfl: 0   zolpidem (AMBIEN) 10 MG tablet, Take 1 tablet (10 mg total) by mouth at bedtime as needed for sleep., Disp: 30 tablet, Rfl: 0   Subjective:   PATIENT ID: Carrie Thompson GENDER:  female DOB: [birth year], MRN: [id number]  Chief Complaint  Patient presents with   pulmonary consult    Hx of allergies. Voice hoariness and chest tightness during allergy flare.       HPI  Carrie Thompson is a 32 year old female presenting to clinic for evaluation after a positive methacholine challenge test.  Patient was part of a study at [facility] for healthy volunteers part of which entailed  methacholine challenge test. The patient underwent the test on January 19, 2022 which turned out to be positive (decrease in FEV1 by 17% with the 0.25 mg/mL dose and decrease by 26.3% with the 1 mg/mL dose).  Patient reports feeling chest tightness during the test that resolved after 2 administrations of albuterol.  She reports being healthy overall but does tell me that she was diagnosed with an asthma exacerbation when she was 32 years of age after being exposed to burning leaves. She consistently experiences allergies that manifest in the form of a runny nose, scratchy eyes, and a scratch throat. She sometimes feels chest tightness especially when the allergies are bad and is unsure if she has heard wheezing.  She does have some limitation in terms of exercise secondary to shortness of breath.  She does not report a history of feeling short of breath when exposed to cold weather.  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  She has not been on any inhalers and denies having worsening of shortness of breath around the time she was pregnant.  She has had allergies all her life and tells me that she was tested for those and was found to have some allergies in the past that she cannot recall.  She has tried [facility] many years ago and that did not sit well with her due to its odor.  She is a non-smoker and denies any vaping.  Her mother does smoke but she does not have much exposure to that.  Her house is mostly hardwood that they renovated 9 years ago and did not find any signs of water  damage.  The house does not have a basement.  She has 2 dogs and has had pets throughout her life.  She has 2 children.  Family history is negative for asthma.  She works at a gym  Ancillary information including prior medications, full medical/surgical/family/social histories, and PFTs (when available) are listed below and have been reviewed.   Review of Systems  Constitutional:  Negative for chills, fever and malaise/fatigue.  Respiratory:  Negative for cough, sputum production, shortness of breath and wheezing.   Cardiovascular:  Negative for chest pain and palpitations.     Objective:   Vitals:   04/06/22 1402  BP: 120/68  Pulse: 60  Temp: 98 F (36.7 C)  TempSrc: Temporal  SpO2: 98%  Weight: 220 lb (99.8 kg)  Height: '5\' 8"'$  (1.727 m)   98% on RA BMI Readings from Last 3 Encounters:  04/06/22 33.45 kg/m  03/12/22 32.99 kg/m  03/03/22 32.84 kg/m  Wt Readings from Last 3 Encounters:  04/06/22 220 lb (99.8 kg)  03/12/22 217 lb (98.4 kg)  03/03/22 216 lb (98 kg)    Physical Exam Constitutional:      General: She is not in acute distress.    Appearance: Normal appearance. She is normal weight. She is not ill-appearing.  HENT:     Mouth/Throat:     Mouth: Mucous membranes are moist.  Cardiovascular:     Rate and Rhythm: Normal rate and regular rhythm.  Pulmonary:     Effort: Pulmonary effort is normal.     Breath sounds: Normal breath sounds. No wheezing, rhonchi or rales.  Musculoskeletal:     Cervical back: Normal range of motion.  Skin:    General: Skin is warm.  Neurological:     General: No focal deficit present.     Mental Status: She is alert and oriented to person, place, and time. Mental status is at baseline.       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    Vaginal Pap smear, abnormal      Family History  Problem Relation Age of Onset   Other Mother        varicose veins   Varicose Veins Mother    Mental illness Mother    Diabetes Father    Mental illness Father    Rheum arthritis Maternal Grandmother    Hypertension Maternal Grandfather      Past Surgical History:  Procedure Laterality Date   LAPAROSCOPIC GASTRIC SLEEVE RESECTION N/A 08/15/2019   Procedure: LAPAROSCOPIC GASTRIC SLEEVE RESECTION, Upper Endo, ERAS Pathway;  Surgeon: Clovis Riley, MD;  Location: [facility];  Service: General;  Laterality: N/A;    Social History   Socioeconomic History   Marital status: Married    Spouse name: Not on file   Number of children: Not on file   Years of education: Not on file   Highest education level: Not on file  Occupational History   Not on file  Tobacco Use   Smoking status: Never   Smokeless tobacco: Never  Vaping Use   Vaping Use: Never used  Substance and Sexual Activity   Alcohol use: No    Alcohol/week: 0.0 standard drinks of alcohol   Drug use: No   Sexual activity: Yes    Birth control/protection: I.U.D.  Other Topics Concern   Not on file  Social History Narrative   Not on file   Social Determinants of Health   Financial Resource Strain: Not on file  Food Insecurity: Not on file  Transportation Needs: Not on file  Physical Activity: Not on file  Stress: Not on file  Social Connections: Not on file  Intimate Partner Violence: Not on file     Allergies  Allergen Reactions   Shellfish Allergy Anaphylaxis   Iodine Other (See Comments)    Pt states that she avoids iodine because of her reaction to shellfish.     Povidone-Iodine Other (See Comments)    Reports she avoids due to concern for anaphylaxis     CBC    Component Value Date/Time   WBC 13.5 (H) 08/16/2019 0420   RBC 4.32 08/16/2019 0420   HGB 13.1 08/16/2019 0420  HCT 39.8 08/16/2019 0420   PLT 332 08/16/2019 0420   MCV 92.1  08/16/2019 0420   MCH 30.3 08/16/2019 0420   MCHC 32.9 08/16/2019 0420   RDW 12.2 08/16/2019 0420   LYMPHSABS 1.5 08/16/2019 0420   MONOABS 0.5 08/16/2019 0420   EOSABS 0.0 08/16/2019 0420   BASOSABS 0.0 08/16/2019 0420    Pulmonary Functions Testing Results:     No data to display          Outpatient Medications Prior to Visit  Medication Sig Dispense Refill   amphetamine-dextroamphetamine (ADDERALL) 30 MG tablet Take 1 tablet by mouth 2 (two) times daily. 60 tablet 0   EPINEPHrine 0.3 mg/0.3 mL IJ SOAJ injection Inject 0.3 mg into the muscle as needed for anaphylaxis. 1 each 0   levonorgestrel (MIRENA, 52 MG,) 20 MCG/DAY IUD Mirena 20 mcg/24 hours (8 yrs) 52 mg intrauterine device  Take 1 device by intrauterine route.     scopolamine (TRANSDERM-SCOP) 1 MG/3DAYS Place 1 patch (1.5 mg total) onto the skin every 3 (three) days. For motion sickness. Start 2 hr before onset symptoms, up to 12 hr before 10 patch 0   zolpidem (AMBIEN) 10 MG tablet Take 1 tablet (10 mg total) by mouth at bedtime as needed for sleep. 30 tablet 0   No facility-administered medications prior to visit.

## 2022-04-08 LAB — ALLERGEN PANEL (27) + IGE
Alternaria Alternata IgE: <0.1 kU/L
Aspergillus Fumigatus IgE: <0.1 kU/L
Bahia Grass IgE: <0.1 kU/L
Bermuda Grass IgE: <0.1 kU/L
Cat Dander IgE: <0.1 kU/L
Cedar, Mountain IgE: <0.1 kU/L
Cladosporium Herbarum IgE: <0.1 kU/L
Cocklebur IgE: <0.1 kU/L
Cockroach, American IgE: <0.1 kU/L
Common Silver Birch IgE: 0.15 kU/L — AB
D Farinae IgE: 1.07 kU/L — AB
D Pteronyssinus IgE: 0.53 kU/L — AB
Dog Dander IgE: <0.1 kU/L
Elm, American IgE: <0.1 kU/L
Hickory, White IgE: <0.1 kU/L
IgE (Immunoglobulin E), Serum: 11 IU/mL (ref 6–495)
Johnson Grass IgE: <0.1 kU/L
Kentucky Bluegrass IgE: <0.1 kU/L
Maple/Box Elder IgE: <0.1 kU/L
Mucor Racemosus IgE: <0.1 kU/L
Oak, White IgE: 0.14 kU/L — AB
Penicillium Chrysogen IgE: <0.1 kU/L
Pigweed, Rough IgE: <0.1 kU/L
Plantain, English IgE: <0.1 kU/L
Ragweed, Short IgE: <0.1 kU/L
Setomelanomma Rostrat: <0.1 kU/L
Timothy Grass IgE: <0.1 kU/L
White Mulberry IgE: <0.1 kU/L

## 2022-04-08 LAB — CBC WITH DIFFERENTIAL/PLATELET
Basophils Absolute: 0.1 10*3/uL (ref 0.0–0.2)
Basos: 1 %
EOS (ABSOLUTE): 0.1 10*3/uL (ref 0.0–0.4)
Eos: 2 %
Hematocrit: 41.3 % (ref 34.0–46.6)
Hemoglobin: 13.8 g/dL (ref 11.1–15.9)
Immature Grans (Abs): 0 10*3/uL (ref 0.0–0.1)
Immature Granulocytes: 0 %
Lymphocytes Absolute: 2.9 10*3/uL (ref 0.7–3.1)
Lymphs: 32 %
MCH: 31.5 pg (ref 26.6–33.0)
MCHC: 33.4 g/dL (ref 31.5–35.7)
MCV: 94 fL (ref 79–97)
Monocytes Absolute: 0.3 10*3/uL (ref 0.1–0.9)
Monocytes: 4 %
Neutrophils Absolute: 5.8 10*3/uL (ref 1.4–7.0)
Neutrophils: 61 %
Platelets: 240 10*3/uL (ref 150–450)
RBC: 4.38 x10E6/uL (ref 3.77–5.28)
RDW: 12.2 % (ref 11.7–15.4)
WBC: 9.2 10*3/uL (ref 3.4–10.8)

## 2022-04-20 DIAGNOSIS — Z91013 Allergy to seafood: Secondary | ICD-10-CM | POA: Diagnosis not present

## 2022-04-20 DIAGNOSIS — S73192A Other sprain of left hip, initial encounter: Secondary | ICD-10-CM | POA: Diagnosis not present

## 2022-04-20 DIAGNOSIS — J45909 Unspecified asthma, uncomplicated: Secondary | ICD-10-CM | POA: Diagnosis not present

## 2022-04-20 DIAGNOSIS — D72829 Elevated white blood cell count, unspecified: Secondary | ICD-10-CM | POA: Diagnosis not present

## 2022-04-20 DIAGNOSIS — Z8616 Personal history of COVID-19: Secondary | ICD-10-CM | POA: Diagnosis not present

## 2022-04-20 DIAGNOSIS — G8918 Other acute postprocedural pain: Secondary | ICD-10-CM | POA: Diagnosis not present

## 2022-04-20 DIAGNOSIS — M24152 Other articular cartilage disorders, left hip: Secondary | ICD-10-CM | POA: Diagnosis not present

## 2022-04-20 DIAGNOSIS — F419 Anxiety disorder, unspecified: Secondary | ICD-10-CM | POA: Diagnosis not present

## 2022-04-20 DIAGNOSIS — T8484XA Pain due to internal orthopedic prosthetic devices, implants and grafts, initial encounter: Secondary | ICD-10-CM | POA: Diagnosis not present

## 2022-04-20 DIAGNOSIS — Z87892 Personal history of anaphylaxis: Secondary | ICD-10-CM | POA: Diagnosis not present

## 2022-04-20 DIAGNOSIS — Z91041 Radiographic dye allergy status: Secondary | ICD-10-CM | POA: Diagnosis not present

## 2022-04-20 DIAGNOSIS — Z975 Presence of (intrauterine) contraceptive device: Secondary | ICD-10-CM | POA: Diagnosis not present

## 2022-04-20 DIAGNOSIS — Q6589 Other specified congenital deformities of hip: Secondary | ICD-10-CM | POA: Diagnosis not present

## 2022-04-20 DIAGNOSIS — Z79899 Other long term (current) drug therapy: Secondary | ICD-10-CM | POA: Diagnosis not present

## 2022-04-20 DIAGNOSIS — M25852 Other specified joint disorders, left hip: Secondary | ICD-10-CM | POA: Diagnosis not present

## 2022-04-20 DIAGNOSIS — F32A Depression, unspecified: Secondary | ICD-10-CM | POA: Diagnosis not present

## 2022-04-20 DIAGNOSIS — E876 Hypokalemia: Secondary | ICD-10-CM | POA: Diagnosis not present

## 2022-04-20 DIAGNOSIS — Z9884 Bariatric surgery status: Secondary | ICD-10-CM | POA: Diagnosis not present

## 2022-04-20 DIAGNOSIS — G4700 Insomnia, unspecified: Secondary | ICD-10-CM | POA: Diagnosis not present

## 2022-04-23 ENCOUNTER — Telehealth: Payer: Self-pay

## 2022-04-23 HISTORY — PX: HIP SURGERY: SHX245

## 2022-04-23 NOTE — Patient Outreach (Signed)
  Care Coordination [facility] Note Transition Care Management Follow-up Telephone Call Date of discharge and from where: [facility] 04/22/22 How have you been since you were released from the hospital? "I am doing good, I had surgery on my left hip on Monday.  Pain well controlled". Any questions or concerns? No  Items Reviewed: Did the pt receive and understand the discharge instructions provided? Yes  Medications obtained and verified? Yes  Other? No  Any new allergies since your discharge? No  Dietary orders reviewed? No Do you have support at home? Yes   Home Care and Equipment/Supplies: Were home health services ordered? no If so, what is the name of the agency? N/a  Has the agency set up a time to come to the patient's home? no Were any new equipment or medical supplies ordered?  Yes: wheelchair What is the name of the medical supply agency? [employer] Were you able to get the supplies/equipment? yes Do you have any questions related to the use of the equipment or supplies? No  Functional Questionnaire: (I = Independent and D = Dependent) ADLs: Needs assistance  Bathing/Dressing- needs assistance  Meal Prep- Needs assistance  Eating- I  Maintaining continence- I  Transferring/Ambulation- needs assistance  Managing Meds- I  Follow up appointments reviewed:  PCP Hospital f/u appt confirmed? No   Specialist Hospital f/u appt confirmed? Yes  Scheduled to see Dr. Bobby Rumpf (ortho at [facility] on 05/08/22 @ 11:00. Are transportation arrangements needed? No  If their condition worsens, is the pt aware to call PCP or go to the Emergency Dept.? Yes Was the patient provided with contact information for the PCP's office or ED? Yes Was to pt encouraged to call back with questions or concerns? Yes  SDOH assessments and interventions completed:   Yes SDOH Interventions Today    Flowsheet Row Most Recent Value  SDOH Interventions   Food Insecurity Interventions Intervention Not Indicated  Housing  Interventions Intervention Not Indicated       Care Coordination Interventions:  No Care Coordination interventions needed at this time.   Encounter Outcome:  Pt. Visit Completed

## 2022-04-24 ENCOUNTER — Other Ambulatory Visit: Payer: Self-pay | Admitting: Internal Medicine

## 2022-04-24 MED ORDER — ZOLPIDEM TARTRATE 10 MG PO TABS: 10.0000 mg | ORAL_TABLET | Freq: Every evening | ORAL | 0 refills | Status: DC | PRN

## 2022-05-08 DIAGNOSIS — Z4789 Encounter for other orthopedic aftercare: Secondary | ICD-10-CM | POA: Diagnosis not present

## 2022-05-21 DIAGNOSIS — Q6589 Other specified congenital deformities of hip: Secondary | ICD-10-CM | POA: Diagnosis not present

## 2022-05-24 ENCOUNTER — Other Ambulatory Visit: Payer: Self-pay | Admitting: Internal Medicine

## 2022-05-27 ENCOUNTER — Ambulatory Visit (INDEPENDENT_AMBULATORY_CARE_PROVIDER_SITE_OTHER): Payer: BC Managed Care – PPO | Admitting: Internal Medicine

## 2022-05-27 ENCOUNTER — Other Ambulatory Visit: Payer: Self-pay | Admitting: Internal Medicine

## 2022-05-27 ENCOUNTER — Encounter: Payer: Self-pay | Admitting: Internal Medicine

## 2022-05-27 VITALS — BP 136/82 | HR 62 | Temp 96.8°F | Wt 219.0 lb

## 2022-05-27 DIAGNOSIS — M25551 Pain in right hip: Secondary | ICD-10-CM

## 2022-05-27 DIAGNOSIS — Z6833 Body mass index (BMI) 33.0-33.9, adult: Secondary | ICD-10-CM

## 2022-05-27 DIAGNOSIS — G8929 Other chronic pain: Secondary | ICD-10-CM

## 2022-05-27 DIAGNOSIS — E6609 Other obesity due to excess calories: Secondary | ICD-10-CM | POA: Diagnosis not present

## 2022-05-27 DIAGNOSIS — F419 Anxiety disorder, unspecified: Secondary | ICD-10-CM

## 2022-05-27 DIAGNOSIS — J4520 Mild intermittent asthma, uncomplicated: Secondary | ICD-10-CM

## 2022-05-27 DIAGNOSIS — F988 Other specified behavioral and emotional disorders with onset usually occurring in childhood and adolescence: Secondary | ICD-10-CM

## 2022-05-27 DIAGNOSIS — G932 Benign intracranial hypertension: Secondary | ICD-10-CM

## 2022-05-27 DIAGNOSIS — F5101 Primary insomnia: Secondary | ICD-10-CM

## 2022-05-27 DIAGNOSIS — F32A Depression, unspecified: Secondary | ICD-10-CM

## 2022-05-27 DIAGNOSIS — M25552 Pain in left hip: Secondary | ICD-10-CM

## 2022-05-27 NOTE — Assessment & Plan Note (Signed)
Continue Adderall as needed

## 2022-05-27 NOTE — Assessment & Plan Note (Signed)
Continue Ambien. 

## 2022-05-27 NOTE — Assessment & Plan Note (Signed)
Prior surgery on the right and recent surgery 5 weeks ago on the left She will start PT in 1 week

## 2022-05-27 NOTE — Assessment & Plan Note (Signed)
Encourage diet and exercise for weight loss 

## 2022-05-27 NOTE — Assessment & Plan Note (Signed)
Currently not an issue off meds

## 2022-05-27 NOTE — Progress Notes (Signed)
Subjective:    Patient ID: Carrie Thompson, female    DOB: 18-Sep-1990, 32 y.o.   MRN: [id number]  HPI  Patient presents to clinic today for follow-up of chronic conditions.  Asthma: As a child.  She denies cough or shortness of breath.  She is not using any inhalers at this time.  There are no PFTs on file.  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  She is not taking any medications for this.  She does not follow with neurology.  Anxiety and Depression: In remission.  She is not currently taking any medications for this.  She is not seeing a therapist.  She denies SI/HI.  ADD: She reports mainly inattention.  She takes Adderall only as needed.  She does not follow with psychiatry.  Insomnia: She has trouble falling and staying asleep.  She takes Ambien as prescribed with good relief of symptoms.  There is no sleep study on file.  Chronic Hip Pain: Status post suregery 5 weeks ago. She recently had the screws taken out of her right hip and labral repair, broke her pelvis to fix the dysplasia and put screws in. She is not currently taking any medication for this. She has not started PT yet.  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 8 g 2   amphetamine-dextroamphetamine (ADDERALL) 30 MG tablet Take 1 tablet by mouth 2 (two) times daily. 60 tablet 0   desloratadine (CLARINEX) 5 MG tablet Take 1 tablet (5 mg total) by mouth daily. 30 tablet 11   EPINEPHrine 0.3 mg/0.3 mL IJ SOAJ injection Inject 0.3 mg into the muscle as needed for  anaphylaxis. 1 each 0   fluticasone (FLONASE) 50 MCG/ACT nasal spray Place 1 spray into both nostrils daily. 18.2 mL 11   levonorgestrel (MIRENA, 52 MG,) 20 MCG/DAY IUD Mirena 20 mcg/24 hours (8 yrs) 52 mg intrauterine device  Take 1 device by intrauterine route.     scopolamine (TRANSDERM-SCOP) 1 MG/3DAYS Place 1 patch (1.5 mg total) onto the skin every 3 (three) days. For motion sickness. Start 2 hr before onset symptoms, up to 12 hr before 10 patch 0   zolpidem (AMBIEN) 10 MG tablet Take 1 tablet (10 mg total) by mouth at bedtime as needed for sleep. 30 tablet 0   No current facility-administered medications for this visit.    Allergies  Allergen Reactions   Shellfish Allergy Anaphylaxis   Iodine Other (See Comments)    Pt states that she avoids iodine because of her reaction to shellfish.     Povidone-Iodine Other (See Comments)    Reports she avoids due to concern for anaphylaxis    Family History  Problem Relation Age of Onset   Other Mother        varicose veins   Varicose Veins Mother    Mental illness Mother    Diabetes Father    Mental illness Father    Rheum arthritis Maternal Grandmother  Hypertension Maternal Grandfather     Social History   Socioeconomic History   Marital status: Married    Spouse name: Not on file   Number of children: Not on file   Years of education: Not on file   Highest education level: Not on file  Occupational History   Not on file  Tobacco Use   Smoking status: Never   Smokeless tobacco: Never  Vaping Use   Vaping Use: Never used  Substance and Sexual Activity   Alcohol use: No    Alcohol/week: 0.0 standard drinks of alcohol   Drug use: No   Sexual activity: Yes    Birth control/protection: I.U.D.  Other Topics Concern   Not on file  Social History Narrative   Not on file   Social Determinants of Health   Financial Resource Strain: Not on file  Food Insecurity: No Food Insecurity (04/23/2022)   Hunger Vital Sign     Worried About Running Out of Food in the Last Year: Never true    Ran Out of Food in the Last Year: Never true  Transportation Needs: Not on file  Physical Activity: Not on file  Stress: Not on file  Social Connections: Not on file  Intimate Partner Violence: Not on file     Constitutional: Denies fever, malaise, fatigue, headache or abrupt weight changes.  HEENT: Denies eye pain, eye redness, ear pain, ringing in the ears, wax buildup, runny nose, nasal congestion, bloody nose, or sore throat. Respiratory: Denies difficulty breathing, shortness of breath, cough or sputum production.   Cardiovascular: Denies chest pain, chest tightness, palpitations or swelling in the hands or feet.  Gastrointestinal: Denies abdominal pain, bloating, constipation, diarrhea or blood in the stool.  GU: Denies urgency, frequency, pain with urination, burning sensation, blood in urine, odor or discharge. Musculoskeletal: Pt reports some difficulty with gait, recent hip surgery. Denies decrease in range of motion,  muscle pain or joint pain and swelling.  Skin: Denies redness, rashes, lesions or ulcercations.  Neurological: Patient reports insomnia, inattention.  Denies dizziness, difficulty with memory, difficulty with speech or problems with balance and coordination.  Psych: Patient has a history of anxiety and depression.  Denies anxiety, depression, SI/HI.  No other specific complaints in a complete review of systems (except as listed in HPI above).  Objective:   Physical Exam  BP 136/82 (BP Location: Right Arm, Patient Position: Sitting, Cuff Size: Normal)   Pulse 62   Temp (!) 96.8 F (36 C) (Temporal)   Wt 219 lb (99.3 kg)   SpO2 99%   BMI 33.30 kg/m   Wt Readings from Last 3 Encounters:  04/06/22 220 lb (99.8 kg)  03/12/22 217 lb (98.4 kg)  03/03/22 216 lb (98 kg)    General: Appears her stated age, obese, in NAD. Skin: Warm, dry and intact.  HEENT: Head: normal shape and size; Eyes:  sclera white, no icterus, conjunctiva pink, PERRLA and EOMs intact;  Cardiovascular: Normal rate and rhythm. S1,S2 noted.  No murmur, rubs or gallops noted.  Pulmonary/Chest: Normal effort and positive vesicular breath sounds. No respiratory distress. No wheezes, rales or ronchi noted.  Musculoskeletal: Walking with crutches. Neurological: Alert and oriented. Coordination normal.  Psychiatric: Mood and affect normal. Behavior is normal. Judgment and thought content normal.     BMET    Component Value Date/Time   NA 140 08/16/2019 0420   K 4.0 08/16/2019 0420   CL 106 08/16/2019 0420   CO2 27 08/16/2019 0420  GLUCOSE 134 (H) 08/16/2019 0420   BUN 11 08/16/2019 0420   CREATININE 0.77 08/16/2019 0420   CREATININE 0.86 10/14/2018 1459   CALCIUM 8.3 (L) 08/16/2019 0420   GFRNONAA >60 08/16/2019 0420   GFRAA >60 08/16/2019 0420    Lipid Panel     Component Value Date/Time   CHOL 119 06/03/2016 1455   TRIG 58.0 06/03/2016 1455   HDL 30.30 (L) 06/03/2016 1455   CHOLHDL 4 06/03/2016 1455   VLDL 11.6 06/03/2016 1455   LDLCALC 77 06/03/2016 1455    CBC    Component Value Date/Time   WBC 9.2 04/06/2022 1525   WBC 13.5 (H) 08/16/2019 0420   RBC 4.38 04/06/2022 1525   RBC 4.32 08/16/2019 0420   HGB 13.8 04/06/2022 1525   HCT 41.3 04/06/2022 1525   PLT 240 04/06/2022 1525   MCV 94 04/06/2022 1525   MCH 31.5 04/06/2022 1525   MCH 30.3 08/16/2019 0420   MCHC 33.4 04/06/2022 1525   MCHC 32.9 08/16/2019 0420   RDW 12.2 04/06/2022 1525   LYMPHSABS 2.9 04/06/2022 1525   MONOABS 0.5 08/16/2019 0420   EOSABS 0.1 04/06/2022 1525   BASOSABS 0.1 04/06/2022 1525    Hgb A1C Lab Results  Component Value Date   HGBA1C 5.5 10/14/2018            Assessment & Plan:     RTC in 6 months for your annual exam Webb Silversmith, NP

## 2022-05-27 NOTE — Patient Instructions (Signed)
Insomnia Insomnia is a sleep disorder that makes it difficult to fall asleep or stay asleep. Insomnia can cause fatigue, low energy, difficulty concentrating, mood swings, and poor performance at work or school. There are three different ways to classify insomnia: Difficulty falling asleep. Difficulty staying asleep. Waking up too early in the morning. Any type of insomnia can be long-term (chronic) or short-term (acute). Both are common. Short-term insomnia usually lasts for 3 months or less. Chronic insomnia occurs at least three times a week for longer than 3 months. What are the causes? Insomnia may be caused by another condition, situation, or substance, such as: Having certain mental health conditions, such as anxiety and depression. Using caffeine, alcohol, tobacco, or drugs. Having gastrointestinal conditions, such as gastroesophageal reflux disease (GERD). Having certain medical conditions. These include: Asthma. Alzheimer's disease. Stroke. Chronic pain. An overactive thyroid gland (hyperthyroidism). Other sleep disorders, such as restless legs syndrome and sleep apnea. Menopause. Sometimes, the cause of insomnia may not be known. What increases the risk? Risk factors for insomnia include: Gender. Females are affected more often than males. Age. Insomnia is more common as people get older. Stress and certain medical and mental health conditions. Lack of exercise. Having an irregular work schedule. This may include working night shifts and traveling between different time zones. What are the signs or symptoms? If you have insomnia, the main symptom is having trouble falling asleep or having trouble staying asleep. This may lead to other symptoms, such as: Feeling tired or having low energy. Feeling nervous about going to sleep. Not feeling rested in the morning. Having trouble concentrating. Feeling irritable, anxious, or depressed. How is this diagnosed? This condition  may be diagnosed based on: Your symptoms and medical history. Your health care provider may ask about: Your sleep habits. Any medical conditions you have. Your mental health. A physical exam. How is this treated? Treatment for insomnia depends on the cause. Treatment may focus on treating an underlying condition that is causing the insomnia. Treatment may also include: Medicines to help you sleep. Counseling or therapy. Lifestyle adjustments to help you sleep better. Follow these instructions at home: Eating and drinking  Limit or avoid alcohol, caffeinated beverages, and products that contain nicotine and tobacco, especially close to bedtime. These can disrupt your sleep. Do not eat a large meal or eat spicy foods right before bedtime. This can lead to digestive discomfort that can make it hard for you to sleep. Sleep habits  Keep a sleep diary to help you and your health care provider figure out what could be causing your insomnia. Write down: When you sleep. When you wake up during the night. How well you sleep and how rested you feel the next day. Any side effects of medicines you are taking. What you eat and drink. Make your bedroom a dark, comfortable place where it is easy to fall asleep. Put up shades or blackout curtains to block light from outside. Use a white noise machine to block noise. Keep the temperature cool. Limit screen use before bedtime. This includes: Not watching TV. Not using your smartphone, tablet, or computer. Stick to a routine that includes going to bed and waking up at the same times every day and night. This can help you fall asleep faster. Consider making a quiet activity, such as reading, part of your nighttime routine. Try to avoid taking naps during the day so that you sleep better at night. Get out of bed if you are still awake after   15 minutes of trying to sleep. Keep the lights down, but try reading or doing a quiet activity. When you feel  sleepy, go back to bed. General instructions Take over-the-counter and prescription medicines only as told by your health care provider. Exercise regularly as told by your health care provider. However, avoid exercising in the hours right before bedtime. Use relaxation techniques to manage stress. Ask your health care provider to suggest some techniques that may work well for you. These may include: Breathing exercises. Routines to release muscle tension. Visualizing peaceful scenes. Make sure that you drive carefully. Do not drive if you feel very sleepy. Keep all follow-up visits. This is important. Contact a health care provider if: You are tired throughout the day. You have trouble in your daily routine due to sleepiness. You continue to have sleep problems, or your sleep problems get worse. Get help right away if: You have thoughts about hurting yourself or someone else. Get help right away if you feel like you may hurt yourself or others, or have thoughts about taking your own life. Go to your nearest emergency room or: Call 911. Call the National Suicide Prevention Lifeline at 1-800-273-8255 or 988. This is open 24 hours a day. Text the Crisis Text Line at 741741. Summary Insomnia is a sleep disorder that makes it difficult to fall asleep or stay asleep. Insomnia can be long-term (chronic) or short-term (acute). Treatment for insomnia depends on the cause. Treatment may focus on treating an underlying condition that is causing the insomnia. Keep a sleep diary to help you and your health care provider figure out what could be causing your insomnia. This information is not intended to replace advice given to you by your health care provider. Make sure you discuss any questions you have with your health care provider. Document Revised: 02/17/2021 Document Reviewed: 02/17/2021 Elsevier Patient Education  2023 Elsevier Inc.  

## 2022-05-28 MED ORDER — ZOLPIDEM TARTRATE 10 MG PO TABS: 10.0000 mg | ORAL_TABLET | Freq: Every evening | ORAL | 0 refills | Status: DC | PRN

## 2022-06-12 DIAGNOSIS — Z4789 Encounter for other orthopedic aftercare: Secondary | ICD-10-CM | POA: Diagnosis not present

## 2022-06-22 DIAGNOSIS — M6281 Muscle weakness (generalized): Secondary | ICD-10-CM | POA: Diagnosis not present

## 2022-06-22 DIAGNOSIS — M25552 Pain in left hip: Secondary | ICD-10-CM | POA: Diagnosis not present

## 2022-06-22 DIAGNOSIS — M25652 Stiffness of left hip, not elsewhere classified: Secondary | ICD-10-CM | POA: Diagnosis not present

## 2022-06-22 DIAGNOSIS — R262 Difficulty in walking, not elsewhere classified: Secondary | ICD-10-CM | POA: Diagnosis not present

## 2022-06-24 ENCOUNTER — Other Ambulatory Visit: Payer: Self-pay | Admitting: Internal Medicine

## 2022-06-26 DIAGNOSIS — M25552 Pain in left hip: Secondary | ICD-10-CM | POA: Diagnosis not present

## 2022-06-26 DIAGNOSIS — M25652 Stiffness of left hip, not elsewhere classified: Secondary | ICD-10-CM | POA: Diagnosis not present

## 2022-06-26 DIAGNOSIS — R262 Difficulty in walking, not elsewhere classified: Secondary | ICD-10-CM | POA: Diagnosis not present

## 2022-06-26 DIAGNOSIS — M6281 Muscle weakness (generalized): Secondary | ICD-10-CM | POA: Diagnosis not present

## 2022-06-26 MED ORDER — ZOLPIDEM TARTRATE 10 MG PO TABS: 10.0000 mg | ORAL_TABLET | Freq: Every evening | ORAL | 0 refills | Status: DC | PRN

## 2022-06-29 DIAGNOSIS — M25552 Pain in left hip: Secondary | ICD-10-CM | POA: Diagnosis not present

## 2022-06-29 DIAGNOSIS — M6281 Muscle weakness (generalized): Secondary | ICD-10-CM | POA: Diagnosis not present

## 2022-06-29 DIAGNOSIS — M25652 Stiffness of left hip, not elsewhere classified: Secondary | ICD-10-CM | POA: Diagnosis not present

## 2022-06-29 DIAGNOSIS — R262 Difficulty in walking, not elsewhere classified: Secondary | ICD-10-CM | POA: Diagnosis not present

## 2022-07-03 DIAGNOSIS — M25552 Pain in left hip: Secondary | ICD-10-CM | POA: Diagnosis not present

## 2022-07-03 DIAGNOSIS — M6281 Muscle weakness (generalized): Secondary | ICD-10-CM | POA: Diagnosis not present

## 2022-07-03 DIAGNOSIS — R262 Difficulty in walking, not elsewhere classified: Secondary | ICD-10-CM | POA: Diagnosis not present

## 2022-07-03 DIAGNOSIS — M25652 Stiffness of left hip, not elsewhere classified: Secondary | ICD-10-CM | POA: Diagnosis not present

## 2022-07-06 DIAGNOSIS — M25652 Stiffness of left hip, not elsewhere classified: Secondary | ICD-10-CM | POA: Diagnosis not present

## 2022-07-06 DIAGNOSIS — M6281 Muscle weakness (generalized): Secondary | ICD-10-CM | POA: Diagnosis not present

## 2022-07-06 DIAGNOSIS — M25552 Pain in left hip: Secondary | ICD-10-CM | POA: Diagnosis not present

## 2022-07-06 DIAGNOSIS — R262 Difficulty in walking, not elsewhere classified: Secondary | ICD-10-CM | POA: Diagnosis not present

## 2022-07-10 DIAGNOSIS — R262 Difficulty in walking, not elsewhere classified: Secondary | ICD-10-CM | POA: Diagnosis not present

## 2022-07-10 DIAGNOSIS — M25652 Stiffness of left hip, not elsewhere classified: Secondary | ICD-10-CM | POA: Diagnosis not present

## 2022-07-10 DIAGNOSIS — M25552 Pain in left hip: Secondary | ICD-10-CM | POA: Diagnosis not present

## 2022-07-10 DIAGNOSIS — M6281 Muscle weakness (generalized): Secondary | ICD-10-CM | POA: Diagnosis not present

## 2022-07-15 DIAGNOSIS — R262 Difficulty in walking, not elsewhere classified: Secondary | ICD-10-CM | POA: Diagnosis not present

## 2022-07-15 DIAGNOSIS — M6281 Muscle weakness (generalized): Secondary | ICD-10-CM | POA: Diagnosis not present

## 2022-07-15 DIAGNOSIS — M25652 Stiffness of left hip, not elsewhere classified: Secondary | ICD-10-CM | POA: Diagnosis not present

## 2022-07-15 DIAGNOSIS — M25552 Pain in left hip: Secondary | ICD-10-CM | POA: Diagnosis not present

## 2022-07-17 DIAGNOSIS — R262 Difficulty in walking, not elsewhere classified: Secondary | ICD-10-CM | POA: Diagnosis not present

## 2022-07-17 DIAGNOSIS — M25552 Pain in left hip: Secondary | ICD-10-CM | POA: Diagnosis not present

## 2022-07-17 DIAGNOSIS — M25652 Stiffness of left hip, not elsewhere classified: Secondary | ICD-10-CM | POA: Diagnosis not present

## 2022-07-17 DIAGNOSIS — M6281 Muscle weakness (generalized): Secondary | ICD-10-CM | POA: Diagnosis not present

## 2022-07-21 DIAGNOSIS — M6281 Muscle weakness (generalized): Secondary | ICD-10-CM | POA: Diagnosis not present

## 2022-07-21 DIAGNOSIS — R262 Difficulty in walking, not elsewhere classified: Secondary | ICD-10-CM | POA: Diagnosis not present

## 2022-07-21 DIAGNOSIS — Q6589 Other specified congenital deformities of hip: Secondary | ICD-10-CM | POA: Diagnosis not present

## 2022-07-21 DIAGNOSIS — M25552 Pain in left hip: Secondary | ICD-10-CM | POA: Diagnosis not present

## 2022-07-21 DIAGNOSIS — M25652 Stiffness of left hip, not elsewhere classified: Secondary | ICD-10-CM | POA: Diagnosis not present

## 2022-07-23 ENCOUNTER — Other Ambulatory Visit: Payer: Self-pay | Admitting: Internal Medicine

## 2022-07-24 DIAGNOSIS — M25652 Stiffness of left hip, not elsewhere classified: Secondary | ICD-10-CM | POA: Diagnosis not present

## 2022-07-24 DIAGNOSIS — M6281 Muscle weakness (generalized): Secondary | ICD-10-CM | POA: Diagnosis not present

## 2022-07-24 DIAGNOSIS — R262 Difficulty in walking, not elsewhere classified: Secondary | ICD-10-CM | POA: Diagnosis not present

## 2022-07-24 DIAGNOSIS — M25552 Pain in left hip: Secondary | ICD-10-CM | POA: Diagnosis not present

## 2022-07-24 MED ORDER — ZOLPIDEM TARTRATE 10 MG PO TABS: 10.0000 mg | ORAL_TABLET | Freq: Every evening | ORAL | 0 refills | Status: DC | PRN

## 2022-07-24 MED ORDER — EPINEPHRINE 0.3 MG/0.3ML IJ SOAJ: 0.3000 mg | INTRAMUSCULAR | 0 refills | Status: DC | PRN

## 2022-07-30 DIAGNOSIS — R262 Difficulty in walking, not elsewhere classified: Secondary | ICD-10-CM | POA: Diagnosis not present

## 2022-07-30 DIAGNOSIS — M25552 Pain in left hip: Secondary | ICD-10-CM | POA: Diagnosis not present

## 2022-07-30 DIAGNOSIS — M25652 Stiffness of left hip, not elsewhere classified: Secondary | ICD-10-CM | POA: Diagnosis not present

## 2022-07-30 DIAGNOSIS — M6281 Muscle weakness (generalized): Secondary | ICD-10-CM | POA: Diagnosis not present

## 2022-07-31 DIAGNOSIS — Z4789 Encounter for other orthopedic aftercare: Secondary | ICD-10-CM | POA: Diagnosis not present

## 2022-07-31 DIAGNOSIS — Z9889 Other specified postprocedural states: Secondary | ICD-10-CM | POA: Diagnosis not present

## 2022-08-03 DIAGNOSIS — M25652 Stiffness of left hip, not elsewhere classified: Secondary | ICD-10-CM | POA: Diagnosis not present

## 2022-08-03 DIAGNOSIS — R262 Difficulty in walking, not elsewhere classified: Secondary | ICD-10-CM | POA: Diagnosis not present

## 2022-08-03 DIAGNOSIS — M25552 Pain in left hip: Secondary | ICD-10-CM | POA: Diagnosis not present

## 2022-08-03 DIAGNOSIS — M6281 Muscle weakness (generalized): Secondary | ICD-10-CM | POA: Diagnosis not present

## 2022-08-10 DIAGNOSIS — M25552 Pain in left hip: Secondary | ICD-10-CM | POA: Diagnosis not present

## 2022-08-10 DIAGNOSIS — R262 Difficulty in walking, not elsewhere classified: Secondary | ICD-10-CM | POA: Diagnosis not present

## 2022-08-10 DIAGNOSIS — M25652 Stiffness of left hip, not elsewhere classified: Secondary | ICD-10-CM | POA: Diagnosis not present

## 2022-08-10 DIAGNOSIS — M6281 Muscle weakness (generalized): Secondary | ICD-10-CM | POA: Diagnosis not present

## 2022-08-14 DIAGNOSIS — R262 Difficulty in walking, not elsewhere classified: Secondary | ICD-10-CM | POA: Diagnosis not present

## 2022-08-14 DIAGNOSIS — M25652 Stiffness of left hip, not elsewhere classified: Secondary | ICD-10-CM | POA: Diagnosis not present

## 2022-08-14 DIAGNOSIS — M25552 Pain in left hip: Secondary | ICD-10-CM | POA: Diagnosis not present

## 2022-08-14 DIAGNOSIS — M6281 Muscle weakness (generalized): Secondary | ICD-10-CM | POA: Diagnosis not present

## 2022-08-18 DIAGNOSIS — M6281 Muscle weakness (generalized): Secondary | ICD-10-CM | POA: Diagnosis not present

## 2022-08-18 DIAGNOSIS — M25652 Stiffness of left hip, not elsewhere classified: Secondary | ICD-10-CM | POA: Diagnosis not present

## 2022-08-18 DIAGNOSIS — M25552 Pain in left hip: Secondary | ICD-10-CM | POA: Diagnosis not present

## 2022-08-18 DIAGNOSIS — R262 Difficulty in walking, not elsewhere classified: Secondary | ICD-10-CM | POA: Diagnosis not present

## 2022-08-21 DIAGNOSIS — Q6589 Other specified congenital deformities of hip: Secondary | ICD-10-CM | POA: Diagnosis not present

## 2022-08-23 ENCOUNTER — Other Ambulatory Visit: Payer: Self-pay | Admitting: Internal Medicine

## 2022-08-24 MED ORDER — ZOLPIDEM TARTRATE 10 MG PO TABS: 10.0000 mg | ORAL_TABLET | Freq: Every evening | ORAL | 0 refills | Status: DC | PRN

## 2022-08-31 DIAGNOSIS — R262 Difficulty in walking, not elsewhere classified: Secondary | ICD-10-CM | POA: Diagnosis not present

## 2022-08-31 DIAGNOSIS — M25652 Stiffness of left hip, not elsewhere classified: Secondary | ICD-10-CM | POA: Diagnosis not present

## 2022-08-31 DIAGNOSIS — M6281 Muscle weakness (generalized): Secondary | ICD-10-CM | POA: Diagnosis not present

## 2022-08-31 DIAGNOSIS — M25552 Pain in left hip: Secondary | ICD-10-CM | POA: Diagnosis not present

## 2022-09-20 DIAGNOSIS — Q6589 Other specified congenital deformities of hip: Secondary | ICD-10-CM | POA: Diagnosis not present

## 2022-09-27 ENCOUNTER — Other Ambulatory Visit: Payer: Self-pay | Admitting: Internal Medicine

## 2022-09-28 MED ORDER — ZOLPIDEM TARTRATE 10 MG PO TABS: 10.0000 mg | ORAL_TABLET | Freq: Every evening | ORAL | 0 refills | Status: DC | PRN

## 2022-10-01 ENCOUNTER — Encounter: Payer: Self-pay | Admitting: Surgery

## 2022-10-01 ENCOUNTER — Ambulatory Visit (INDEPENDENT_AMBULATORY_CARE_PROVIDER_SITE_OTHER): Payer: BC Managed Care – PPO | Admitting: Surgery

## 2022-10-01 VITALS — BP 134/70 | HR 62 | Temp 98.0°F | Ht 68.0 in | Wt 223.0 lb

## 2022-10-01 DIAGNOSIS — R2232 Localized swelling, mass and lump, left upper limb: Secondary | ICD-10-CM

## 2022-10-01 DIAGNOSIS — D2362 Other benign neoplasm of skin of left upper limb, including shoulder: Secondary | ICD-10-CM | POA: Diagnosis not present

## 2022-10-01 NOTE — Progress Notes (Signed)
Patient ID: Carrie Thompson, female   DOB: 06-23-1990, 32 y.o.   MRN: [id number]  Chief Complaint: Bump on left upper arm for a year and a half.  History of Present Illness Carrie Thompson is a 32 y.o. female with uncertain etiology of a reddened, tender, indurated bump on the upper lateral left arm.  No significant change in size or its appearance over time.  No drainage.  Past Medical History Past Medical History:  Diagnosis Date   Abnormal Pap smear    Anxiety    Chicken pox    Depression    Hx of varicella    Seizures (HCC)    hx pseudotumor treated in 2011 with meds, never had seizure   Vaginal Pap smear, abnormal       Past Surgical History:  Procedure Laterality Date   HIP SURGERY Left 04/2022   LAPAROSCOPIC GASTRIC SLEEVE RESECTION N/A 08/15/2019   Procedure: LAPAROSCOPIC GASTRIC SLEEVE RESECTION, Upper Endo, ERAS Pathway;  Surgeon: Berna Bue, MD;  Location: [facility];  Service: General;  Laterality: N/A;    Allergies  Allergen Reactions   Shellfish Allergy Anaphylaxis   Iodine Other (See Comments)    Pt states that she avoids iodine because of her reaction to shellfish.     Povidone-Iodine Other (See Comments)    Reports she avoids due to concern for anaphylaxis    Current Outpatient Medications  Medication Sig Dispense Refill   albuterol (VENTOLIN HFA) 108 (90 Base) MCG/ACT inhaler Inhale 2 puffs into the lungs every 6 (six) hours as needed for wheezing or shortness of breath. 8 g 2   amphetamine-dextroamphetamine (ADDERALL) 30 MG tablet Take 1 tablet by mouth 2 (two) times daily. 60 tablet 0   desloratadine (CLARINEX) 5 MG tablet Take 1 tablet (5 mg total) by mouth daily. 30 tablet 11   EPINEPHrine 0.3 mg/0.3 mL IJ SOAJ injection Inject 0.3 mg into the muscle as needed for anaphylaxis. 1 each 0   fluticasone (FLONASE) 50 MCG/ACT nasal spray Place 1 spray into both nostrils daily. 18.2 mL 11   levonorgestrel (MIRENA, 52 MG,) 20 MCG/DAY IUD Mirena 20 mcg/24 hours  (8 yrs) 52 mg intrauterine device  Take 1 device by intrauterine route.     zolpidem (AMBIEN) 10 MG tablet Take 1 tablet (10 mg total) by mouth at bedtime as needed for sleep. 30 tablet 0   No current facility-administered medications for this visit.    Family History Family History  Problem Relation Age of Onset   Other Mother        varicose veins   Varicose Veins Mother    Mental illness Mother    Diabetes Father    Mental illness Father    Rheum arthritis Maternal Grandmother    Hypertension Maternal Grandfather       Social History Social History   Tobacco Use   Smoking status: Never    Passive exposure: Never   Smokeless tobacco: Never  Vaping Use   Vaping status: Never Used  Substance Use Topics   Alcohol use: No    Alcohol/week: 0.0 standard drinks of alcohol   Drug use: No        Review of Systems  All other systems reviewed and are negative.    Physical Exam Blood pressure 134/70, pulse 62, temperature 98 F (36.7 C), height 5\' 8"  (1.727 m), weight 223 lb (101.2 kg), SpO2 98%. Last Weight  Most recent update: 10/01/2022  2:31 PM    Weight  101.2 kg (223 lb)             CONSTITUTIONAL: Well developed, and nourished, appropriately responsive and aware without distress.   EYES: Sclera non-icteric.   EARS, NOSE, MOUTH AND THROAT:  The oropharynx is clear. Oral mucosa is pink and moist.    Hearing is intact to voice.  NECK: Trachea is midline, and there is no jugular venous distension.  LYMPH NODES:  Lymph nodes in the neck are not appreciated. RESPIRATORY:  Lungs are clear, and breath sounds are equal bilaterally. Normal respiratory effort without pathologic use of accessory muscles. CARDIOVASCULAR: Well perfused.  GI: The abdomen is  soft, nontender, and nondistended.  MUSCULOSKELETAL:  Symmetrical muscle tone appreciated in all four extremities.    SKIN: Skin turgor is normal. No pathologic skin lesions appreciated.   There is a 1.4 cm  hyperemic circular, with a central raised punctum appearing firm dermal lesion of the left lateral upper arm. No obvious drainage has ever occurred, does not feel cystic.  No known foreign body.  NEUROLOGIC:  Motor and sensation appear grossly normal.  Cranial nerves are grossly without defect. PSYCH:  Alert and oriented to person, place and time. Affect is appropriate for situation.  Data Reviewed I have personally reviewed what is currently available of the patient's imaging, recent labs and medical records.   Labs:     Latest Ref Rng & Units 04/06/2022    3:25 PM 08/16/2019    4:20 AM 08/08/2019    3:38 PM  CBC  WBC 3.4 - 10.8 x10E3/uL 9.2  13.5  10.3   Hemoglobin 11.1 - 15.9 g/dL 40.9  81.1  91.4   Hematocrit 34.0 - 46.6 % 41.3  39.8  39.5   Platelets 150 - 450 x10E3/uL 240  332  309       Latest Ref Rng & Units 08/16/2019    4:20 AM 08/08/2019    3:38 PM 10/14/2018    2:59 PM  CMP  Glucose 70 - 99 mg/dL 782  956  213   BUN 6 - 20 mg/dL 11  14  12    Creatinine 0.44 - 1.00 mg/dL 0.86  5.78  4.69   Sodium 135 - 145 mmol/L 140  139  138   Potassium 3.5 - 5.1 mmol/L 4.0  3.6  4.3   Chloride 98 - 111 mmol/L 106  104  103   CO2 22 - 32 mmol/L 27  25  26    Calcium 8.9 - 10.3 mg/dL 8.3  8.6  9.0   Total Protein 6.5 - 8.1 g/dL 6.6  6.6  6.4   Total Bilirubin 0.3 - 1.2 mg/dL 0.9  0.9  0.4   Alkaline Phos 38 - 126 U/L 78  82    AST 15 - 41 U/L 21  20  19    ALT 0 - 44 U/L 27  23  21        Imaging: Radiological images reviewed:   Within last 24 hrs: No results found.  Assessment    Skin lesion upper left arm, 1.4 cm diameter, likely benign but of uncertain etiology. 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  Pre-operative Diagnosis: Dermal lesion of uncertain etiology left upper lateral arm.  Post-operative Diagnosis: same.  Final pathology pending  Surgeon: Campbell Lerner, M.D., FACS  Anesthesia: Local   Findings: Limited to dermis.  Estimated Blood Loss: 2 mL         Specimens: Skin lesion as described.          Complications: none              Procedure Details  The patient was evaluated, the benefits, complications, treatment options, and expected outcomes were discussed with the patient. The risks of bleeding, infection, recurrence of symptoms, failure to resolve symptoms, unanticipated injury, any of which could require further surgery were reviewed with the patient. The likelihood of improving the patient's symptoms with return to their baseline status is expected.  The patient and/or family concurred with the proposed plan, giving informed consent.  The patient was taken to our procedure room, identified and the procedure verified.    The patient was positioned in the supine position and the left lateral arm was prepped with Chloraprep and draped in the sterile fashion.  A Time Out was held and the above information confirmed.  Local infiltration with 1% lidocaine with epinephrine is completed to adequate anesthetic effect.  An elliptical incision is made along the skin lines, scalpel taken to subcutaneous tissues which appear normal.  Completely excised.  Hemostasis obtained.  Incision is closed with interrupted dermal sutures of 3-0 Vicryl.  Skin sealed with Dermabond.  Face-to-face time spent with the patient and accompanying care providers(if present) was 35 minutes, with more than 50% of the time spent counseling, educating, and coordinating care of the patient.    These notes generated with voice recognition software. I  apologize for typographical errors.  Campbell Lerner M.D., FACS 10/01/2022, 3:00 PM

## 2022-10-01 NOTE — Patient Instructions (Signed)
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 Please see information below regarding this type of tumor.  You are free to shower tomorrow.   You have glue on your skin and sutures under the skin. The glue will come off on it's own in 10-14 days. You may shower normally until this occurs but do not submerge.  Please use Tylenol or Ibuprofen for pain as needed. You may use ice to the area 3-4 times today and tomorrow for any achiness.   We will see you back in 7-10 days to ensure that this has healed and to review the final pathology. Please see your appointment below. You may continue your regular activities right away but if you are having pain while doing something, stop what you are doing and try this activity once again in 3 days. Please call our office with any questions or concerns prior to your appointment.   Lipoma Removal Lipoma removal is a surgical procedure to remove a noncancerous (benign) tumor that is made up of fat cells (lipoma). Most lipomas are small and painless and do not require treatment. They can form in many areas of the body but are most common under the skin of the back, shoulders, arms, and thighs. You may need lipoma removal if you have a lipoma that is large, growing, or causing discomfort. Lipoma removal may also be done for cosmetic reasons. Tell a health care provider about: Any allergies you have. All medicines you are taking, including vitamins, herbs, eye drops, creams, and over-the-counter medicines. Any problems you or family members have had with anesthetic medicines. Any blood disorders you have. Any surgeries you have had. Any medical conditions you have. Whether you are pregnant or may be pregnant. What are the risks? Generally, this is a safe procedure. However, problems may occur, including: Infection. Bleeding. Allergic reactions to medicines. Damage to nerves or blood vessels near the lipoma. Scarring.  Medicines Ask your health care  provider about: Changing or stopping your regular medicines. This is especially important if you are taking diabetes medicines or blood thinners. Taking medicines such as aspirin and ibuprofen. These medicines can thin your blood. Do not take these medicines before your procedure if your health care provider instructs you not to. You may be given antibiotic medicine to help prevent infection. General instructions Ask your health care provider how your surgical site will be marked or identified. You will have a physical exam. Your health care provider will check the size of the lipoma and whether it can be moved easily.  What happens during the procedure? To reduce your risk of infection: Your health care team will wash or sanitize their hands. Your skin will be washed with surgical soap. You will be given the following: A medicine to numb the area (local anesthetic). An incision will be made over the lipoma or very near the lipoma. The incision may be made in a natural skin line or crease. Tissues, nerves, and blood vessels near the lipoma will be moved out of the way. The lipoma and the capsule that surrounds it will be separated from the surrounding tissues. The lipoma will be removed. The incision may be closed with stitches and surgical glue

## 2022-10-21 DIAGNOSIS — Q6589 Other specified congenital deformities of hip: Secondary | ICD-10-CM | POA: Diagnosis not present

## 2022-10-26 ENCOUNTER — Other Ambulatory Visit: Payer: Self-pay | Admitting: Internal Medicine

## 2022-10-26 MED ORDER — ZOLPIDEM TARTRATE 10 MG PO TABS: 10.0000 mg | ORAL_TABLET | Freq: Every evening | ORAL | 0 refills | Status: DC | PRN

## 2022-10-26 MED ORDER — EPINEPHRINE 0.3 MG/0.3ML IJ SOAJ: 0.3000 mg | INTRAMUSCULAR | 0 refills | Status: AC | PRN

## 2022-10-27 ENCOUNTER — Encounter: Payer: Self-pay | Admitting: Surgery

## 2022-10-27 ENCOUNTER — Ambulatory Visit (INDEPENDENT_AMBULATORY_CARE_PROVIDER_SITE_OTHER): Payer: BC Managed Care – PPO | Admitting: Surgery

## 2022-10-27 VITALS — BP 140/85 | HR 57 | Temp 98.0°F | Ht 68.0 in | Wt 221.0 lb

## 2022-10-27 DIAGNOSIS — R2232 Localized swelling, mass and lump, left upper limb: Secondary | ICD-10-CM | POA: Diagnosis not present

## 2022-10-27 DIAGNOSIS — Z09 Encounter for follow-up examination after completed treatment for conditions other than malignant neoplasm: Secondary | ICD-10-CM | POA: Diagnosis not present

## 2022-10-27 NOTE — Progress Notes (Unsigned)
[facility] POST-OP OFFICE VISIT  10/27/2022  HPI: Carrie Thompson is a 32 y.o. female *** days s/p ***  Vital signs: BP (!) 140/85   Pulse (!) 57   Temp 98 F (36.7 C)   Ht 5\' 8"  (1.727 m)   Wt 221 lb (100.2 kg)   SpO2 97%   BMI 33.60 kg/m    Physical Exam: Constitutional: *** Abdomen: *** Skin: ***  Assessment/Plan: This is a 32 y.o. 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

## 2022-10-27 NOTE — Patient Instructions (Signed)
Let us know if the area does not heal over in the next month.   Follow-up with our office as needed.  Please call and ask to speak with a nurse if you develop questions or concerns.

## 2022-11-06 DIAGNOSIS — M25551 Pain in right hip: Secondary | ICD-10-CM | POA: Diagnosis not present

## 2022-11-06 DIAGNOSIS — Z9889 Other specified postprocedural states: Secondary | ICD-10-CM | POA: Diagnosis not present

## 2022-11-06 DIAGNOSIS — M160 Bilateral primary osteoarthritis of hip: Secondary | ICD-10-CM | POA: Diagnosis not present

## 2022-11-21 DIAGNOSIS — Q6589 Other specified congenital deformities of hip: Secondary | ICD-10-CM | POA: Diagnosis not present

## 2022-11-26 ENCOUNTER — Other Ambulatory Visit: Payer: Self-pay | Admitting: Internal Medicine

## 2022-11-27 ENCOUNTER — Telehealth: Payer: Self-pay | Admitting: Internal Medicine

## 2022-11-27 ENCOUNTER — Other Ambulatory Visit: Payer: Self-pay | Admitting: Internal Medicine

## 2022-11-27 DIAGNOSIS — F5101 Primary insomnia: Secondary | ICD-10-CM

## 2022-11-27 MED ORDER — ZOLPIDEM TARTRATE 10 MG PO TABS
10.0000 mg | ORAL_TABLET | Freq: Every evening | ORAL | 2 refills | Status: DC | PRN
Start: 2022-11-27 — End: 2023-03-03

## 2022-11-27 NOTE — Telephone Encounter (Signed)
Pt called in again about refill, of Ambien, I let her know of 48-72 hr turn around, she insist that she needs it before the weekend

## 2022-11-27 NOTE — Telephone Encounter (Signed)
Patient called to f/u on script for zolpidem (AMBIEN) 10 MG tablet as she needs to pick it up from pharmacy before the end of the day. Please f/u with patient

## 2022-11-30 DIAGNOSIS — Z9889 Other specified postprocedural states: Secondary | ICD-10-CM | POA: Diagnosis not present

## 2022-11-30 DIAGNOSIS — M25551 Pain in right hip: Secondary | ICD-10-CM | POA: Diagnosis not present

## 2022-11-30 DIAGNOSIS — S73191A Other sprain of right hip, initial encounter: Secondary | ICD-10-CM | POA: Diagnosis not present

## 2022-11-30 NOTE — Telephone Encounter (Signed)
RX sent in 09/06

## 2022-12-08 DIAGNOSIS — M25551 Pain in right hip: Secondary | ICD-10-CM | POA: Diagnosis not present

## 2022-12-08 DIAGNOSIS — Z9889 Other specified postprocedural states: Secondary | ICD-10-CM | POA: Diagnosis not present

## 2022-12-18 DIAGNOSIS — M76891 Other specified enthesopathies of right lower limb, excluding foot: Secondary | ICD-10-CM | POA: Diagnosis not present

## 2022-12-21 DIAGNOSIS — Q6589 Other specified congenital deformities of hip: Secondary | ICD-10-CM | POA: Diagnosis not present

## 2022-12-28 ENCOUNTER — Encounter: Payer: Self-pay | Admitting: Internal Medicine

## 2022-12-28 ENCOUNTER — Other Ambulatory Visit: Payer: Self-pay | Admitting: Internal Medicine

## 2022-12-28 DIAGNOSIS — F5101 Primary insomnia: Secondary | ICD-10-CM

## 2022-12-28 NOTE — Telephone Encounter (Signed)
Medication Refill - Medication: zolpidem (AMBIEN) 10 MG tablet   Pt will be out after today   Has the patient contacted their pharmacy? Yes.   (Agent: If no, request that the patient contact the pharmacy for the refill. If patient does not wish to contact the pharmacy document the reason why and proceed with request.) (Agent: If yes, when and what did the pharmacy advise?)  Preferred Pharmacy (with phone number or street name):  [phone] [place], [place] - [address]  [address] [place] [address]  Phone: [phone] Fax: [phone]   Has the patient been seen for an appointment in the last year OR does the patient have an upcoming appointment? Yes.    Agent: Please be advised that RX refills may take up to 3 business days. We ask that you follow-up with your pharmacy.

## 2022-12-29 ENCOUNTER — Telehealth: Payer: Self-pay | Admitting: Internal Medicine

## 2022-12-29 NOTE — Telephone Encounter (Signed)
[facility] called and spoke to Carrie Thompson, [facility] about the refill(s) zolpidem requested. Advised it was sent on 12/21/22 #30/2 refill(s). She says they have it and it was too early when she initially requested, but she can get it today.

## 2022-12-29 NOTE — Telephone Encounter (Signed)
Medication Refill - Medication:  zolpidem (AMBIEN) 10 MG tablet    *completely out, needs a refill up until appt date on Friday 01/01/23  Has the patient contacted their pharmacy? Yes, advised to contact PCP  Preferred Pharmacy (with phone number or street name):  [facility] [phone] [place], [place] - [address] Phone: [phone]  Fax: [phone]     Has the patient been seen for an appointment in the last year OR does the patient have an upcoming appointment? Yes. Follow up scheduled on 10.11.2024 with PCP

## 2022-12-29 NOTE — Telephone Encounter (Signed)
Requested medication (s) are due for refill today:no  Requested medication (s) are on the active medication list: yes  Last refill:  11/27/22 #30 2 RF  Future visit scheduled: no  Notes to clinic:  NT not delegated to refuse med (requested too soon)   Requested Prescriptions  Pending Prescriptions Disp Refills   zolpidem (AMBIEN) 10 MG tablet 30 tablet 2    Sig: Take 1 tablet (10 mg total) by mouth at bedtime as needed for sleep.     Not Delegated - Psychiatry:  Anxiolytics/Hypnotics Failed - 12/28/2022 10:04 AM      Failed - This refill cannot be delegated      Failed - Urine Drug Screen completed in last 360 days      Failed - Valid encounter within last 6 months    Recent Outpatient Visits           7 months ago Pseudotumor cerebri   [place] [facility] [place], Salvadore Oxford, NP   10 months ago Chest tightness   [place] [facility] [place], Salvadore Oxford, NP   1 year ago Encounter for general adult medical examination with abnormal findings   [facility] [place], Salvadore Oxford, NP   1 year ago Status post osteotomy   [facility] [place], Salvadore Oxford, NP   2 years ago Primary insomnia   [place] [facility] [place], Salvadore Oxford, [place]

## 2022-12-30 ENCOUNTER — Ambulatory Visit: Payer: BC Managed Care – PPO | Admitting: Internal Medicine

## 2022-12-31 ENCOUNTER — Other Ambulatory Visit: Payer: Self-pay | Admitting: Internal Medicine

## 2023-01-01 ENCOUNTER — Ambulatory Visit (INDEPENDENT_AMBULATORY_CARE_PROVIDER_SITE_OTHER): Payer: BC Managed Care – PPO | Admitting: Internal Medicine

## 2023-01-01 VITALS — BP 134/78 | HR 72 | Temp 95.0°F | Ht 68.0 in | Wt 218.0 lb

## 2023-01-01 DIAGNOSIS — Z0001 Encounter for general adult medical examination with abnormal findings: Secondary | ICD-10-CM | POA: Diagnosis not present

## 2023-01-01 DIAGNOSIS — Z6833 Body mass index (BMI) 33.0-33.9, adult: Secondary | ICD-10-CM

## 2023-01-01 DIAGNOSIS — R739 Hyperglycemia, unspecified: Secondary | ICD-10-CM

## 2023-01-01 DIAGNOSIS — Z136 Encounter for screening for cardiovascular disorders: Secondary | ICD-10-CM

## 2023-01-01 DIAGNOSIS — E66811 Obesity, class 1: Secondary | ICD-10-CM | POA: Diagnosis not present

## 2023-01-01 DIAGNOSIS — E6609 Other obesity due to excess calories: Secondary | ICD-10-CM

## 2023-01-01 NOTE — Patient Instructions (Signed)

## 2023-01-01 NOTE — Assessment & Plan Note (Signed)
Encouraged diet and exercise for weight loss ?

## 2023-01-01 NOTE — Progress Notes (Signed)
Subjective:    Patient ID: Carrie Thompson, female    DOB: 08-30-1990, 32 y.o.   MRN: [id number]  HPI  Patient presents to clinic today for her annual exam.  Flu: Never Tetanus: 04/2012 COVID: X 1 Pap smear: 2021, [facility] Dentist: biannually  Diet: She does eat meat. She consumes fruits and veggies. She tries to avoid fried foods. She drinks mostly water. 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 8 g 2   amphetamine-dextroamphetamine (ADDERALL) 30 MG tablet Take 1 tablet by mouth 2 (two) times daily. 60 tablet 0   desloratadine (CLARINEX) 5 MG tablet Take 1 tablet (5 mg total) by mouth daily. 30 tablet 11   EPINEPHrine 0.3 mg/0.3 mL IJ SOAJ injection Inject 0.3 mg into the muscle as needed for anaphylaxis. 1 each 0   fluticasone (FLONASE) 50 MCG/ACT nasal spray Place 1 spray into both nostrils daily. 18.2 mL 11   levonorgestrel (MIRENA, 52 MG,) 20 MCG/DAY IUD Mirena 20 mcg/24 hours (8 yrs) 52 mg intrauterine device  Take 1 device by intrauterine route.     zolpidem (AMBIEN) 10 MG tablet Take 1 tablet (10 mg total) by mouth at bedtime as needed for sleep. 30 tablet 2   No current facility-administered medications for this visit.    Allergies  Allergen Reactions   Shellfish Allergy Anaphylaxis   Iodine Other (See Comments)    Pt states that she avoids iodine because of her reaction to shellfish.     Povidone-Iodine Other (See Comments)    Reports she avoids due to concern for anaphylaxis    Family  History  Problem Relation Age of Onset   Other Mother        varicose veins   Varicose Veins Mother    Mental illness Mother    Diabetes Father    Mental illness Father    Rheum arthritis Maternal Grandmother    Hypertension Maternal Grandfather     Social History   Socioeconomic History   Marital status: Married    Spouse name: Not on file   Number of children: Not on file   Years of education: Not on file   Highest education level: Not on file  Occupational History   Not on file  Tobacco Use   Smoking status: Never    Passive exposure: Never   Smokeless tobacco: Never  Vaping Use   Vaping status: Never Used  Substance and Sexual Activity   Alcohol use: No    Alcohol/week: 0.0 standard drinks of alcohol   Drug use: No   Sexual activity: Yes    Birth control/protection: I.U.D.  Other Topics Concern   Not on file  Social History Narrative   Not on file   Social Determinants of Health   Financial Resource Strain: Low Risk  (03/03/2021)   Received from [facility], [facility]   Overall Financial Resource  Strain (CARDIA)    Difficulty of Paying Living Expenses: Not hard at all  Food Insecurity: No Food Insecurity (04/23/2022)   Hunger Vital Sign    Worried About Running Out of Food in the Last Year: Never true    Ran Out of Food in the Last Year: Never true  Transportation Needs: No Transportation Needs (03/03/2021)   Received from [facility], [facility]   [facility] - Transportation    In the past 12 months, has lack of transportation kept you from medical appointments or from getting medications?: No    Lack of Transportation (Non-Medical): No  Physical Activity: Not on file  Stress: Not on file  Social Connections: Not on file  Intimate Partner Violence: Not on file     Constitutional: Denies fever, malaise, fatigue, headache or abrupt weight changes.  HEENT: Denies eye pain, eye  redness, ear pain, ringing in the ears, wax buildup, runny nose, nasal congestion, bloody nose, or sore throat. Respiratory: Denies difficulty breathing, shortness of breath, cough or sputum production.   Cardiovascular: Denies chest pain, chest tightness, palpitations or swelling in the hands or feet.  Gastrointestinal: Denies abdominal pain, bloating, constipation, diarrhea or blood in the stool.  GU: Denies urgency, frequency, pain with urination, burning sensation, blood in urine, odor or discharge. Musculoskeletal: Patient reports chronic hip pain.  Denies decrease in range of motion, difficulty with gait, muscle pain or joint swelling.  Skin: Denies redness, rashes, lesions or ulcercations.  Neurological: Patient reports insomnia, inattention.  Denies dizziness, difficulty with memory, difficulty with speech or problems with balance and coordination.  Psych: Patient has a history of anxiety and depression.  Denies SI/HI.  No other specific complaints in a complete review of systems (except as listed in HPI above).  Objective:   Physical Exam   BP 134/78 (BP Location: Right Arm, Patient Position: Sitting, Cuff Size: Normal)   Pulse 72   Temp (!) 95 F (35 C) (Temporal)   Ht 5\' 8"  (1.727 m)   Wt 218 lb (98.9 kg)   BMI 33.15 kg/m   Wt Readings from Last 3 Encounters:  10/27/22 221 lb (100.2 kg)  10/01/22 223 lb (101.2 kg)  05/27/22 219 lb (99.3 kg)    General: Appears her stated age, obese, in NAD. Skin: Warm, dry and intact.  HEENT: Head: normal shape and size; Eyes: sclera white, no icterus, conjunctiva pink, PERRLA and EOMs intact;  Neck:  Neck supple, trachea midline. No masses, lumps or thyromegaly present.  Cardiovascular: Normal rate and rhythm. S1,S2 noted.  No murmur, rubs or gallops noted. No JVD or BLE edema.  Respiratory: Normal effort and positive vesicular breath sounds. No respiratory distress. No wheezes, rales or ronchi noted.  Abdomen: Soft and nontender.  Normal bowel sounds.  Musculoskeletal: Strength 5/5 BUE/BLE no difficulty with gait.  Neurological: Alert and oriented. Cranial nerves II-XII grossly intact. Coordination normal.  Psychiatric: Mood and affect normal. Behavior is normal. Judgment and thought content normal.     BMET    Component Value Date/Time   NA 140 08/16/2019 0420   K 4.0 08/16/2019 0420   CL 106 08/16/2019 0420   CO2 27 08/16/2019 0420   GLUCOSE 134 (H) 08/16/2019 0420   BUN 11 08/16/2019 0420   CREATININE 0.77 08/16/2019 0420   CREATININE 0.86 10/14/2018 1459   CALCIUM 8.3 (L) 08/16/2019 0420   GFRNONAA >60 08/16/2019 0420   GFRAA >60 08/16/2019 0420    Lipid Panel  Component Value Date/Time   CHOL 119 06/03/2016 1455   TRIG 58.0 06/03/2016 1455   HDL 30.30 (L) 06/03/2016 1455   CHOLHDL 4 06/03/2016 1455   VLDL 11.6 06/03/2016 1455   LDLCALC 77 06/03/2016 1455    CBC    Component Value Date/Time   WBC 9.2 04/06/2022 1525   WBC 13.5 (H) 08/16/2019 0420   RBC 4.38 04/06/2022 1525   RBC 4.32 08/16/2019 0420   HGB 13.8 04/06/2022 1525   HCT 41.3 04/06/2022 1525   PLT 240 04/06/2022 1525   MCV 94 04/06/2022 1525   MCH 31.5 04/06/2022 1525   MCH 30.3 08/16/2019 0420   MCHC 33.4 04/06/2022 1525   MCHC 32.9 08/16/2019 0420   RDW 12.2 04/06/2022 1525   LYMPHSABS 2.9 04/06/2022 1525   MONOABS 0.5 08/16/2019 0420   EOSABS 0.1 04/06/2022 1525   BASOSABS 0.1 04/06/2022 1525    Hgb A1C Lab Results  Component Value Date   HGBA1C 5.5 10/14/2018           Assessment & Plan:   Preventative health maintenance:  Flu shot declined Tetanus declined Encouraged her to get her COVID booster Pap smear UTD per her report, will request copy Encouraged her to consume a balanced diet and exercise regimen Advised her to see an eye doctor and dentist annually Will check CBC, c-Met, lipid, A1c  today  RTC in 6 months, follow-up chronic conditions Nicki Reaper, NP

## 2023-01-02 LAB — COMPLETE METABOLIC PANEL WITH GFR
AG Ratio: 1.9 (calc) (ref 1.0–2.5)
ALT: 14 U/L (ref 6–29)
AST: 14 U/L (ref 10–30)
Albumin: 4.2 g/dL (ref 3.6–5.1)
Alkaline phosphatase (APISO): 57 U/L (ref 31–125)
BUN: 14 mg/dL (ref 7–25)
CO2: 28 mmol/L (ref 20–32)
Calcium: 9 mg/dL (ref 8.6–10.2)
Chloride: 106 mmol/L (ref 98–110)
Creat: 0.75 mg/dL (ref 0.50–0.97)
Globulin: 2.2 g/dL (ref 1.9–3.7)
Glucose, Bld: 84 mg/dL (ref 65–139)
Potassium: 4.6 mmol/L (ref 3.5–5.3)
Sodium: 142 mmol/L (ref 135–146)
Total Bilirubin: 1.1 mg/dL (ref 0.2–1.2)
Total Protein: 6.4 g/dL (ref 6.1–8.1)
eGFR: 108 mL/min/{1.73_m2} (ref >=60)

## 2023-01-02 LAB — CBC
HCT: 42.7 % (ref 35.0–45.0)
Hemoglobin: 14.3 g/dL (ref 11.7–15.5)
MCH: 31.4 pg (ref 27.0–33.0)
MCHC: 33.5 g/dL (ref 32.0–36.0)
MCV: 93.6 fL (ref 80.0–100.0)
MPV: 9.8 fL (ref 7.5–12.5)
Platelets: 280 10*3/uL (ref 140–400)
RBC: 4.56 10*6/uL (ref 3.80–5.10)
RDW: 11.2 % (ref 11.0–15.0)
WBC: 5.7 10*3/uL (ref 3.8–10.8)

## 2023-01-02 LAB — HEMOGLOBIN A1C
Hgb A1c MFr Bld: 5.4 %{Hb} (ref <5.7)
Mean Plasma Glucose: 108 mg/dL
eAG (mmol/L): 6 mmol/L

## 2023-01-02 LAB — LIPID PANEL
Cholesterol: 156 mg/dL (ref <200)
HDL: 41 mg/dL — ABNORMAL LOW (ref >=50)
LDL Cholesterol (Calc): 100 mg/dL — ABNORMAL HIGH
Non-HDL Cholesterol (Calc): 115 mg/dL (ref <130)
Total CHOL/HDL Ratio: 3.8 (calc) (ref <5.0)
Triglycerides: 68 mg/dL (ref <150)

## 2023-01-11 DIAGNOSIS — Z903 Acquired absence of stomach [part of]: Secondary | ICD-10-CM | POA: Diagnosis not present

## 2023-01-11 DIAGNOSIS — J45909 Unspecified asthma, uncomplicated: Secondary | ICD-10-CM | POA: Diagnosis not present

## 2023-01-11 DIAGNOSIS — G4700 Insomnia, unspecified: Secondary | ICD-10-CM | POA: Diagnosis not present

## 2023-01-11 DIAGNOSIS — G932 Benign intracranial hypertension: Secondary | ICD-10-CM | POA: Diagnosis not present

## 2023-01-11 DIAGNOSIS — Z975 Presence of (intrauterine) contraceptive device: Secondary | ICD-10-CM | POA: Diagnosis not present

## 2023-01-11 DIAGNOSIS — M898X9 Other specified disorders of bone, unspecified site: Secondary | ICD-10-CM | POA: Diagnosis not present

## 2023-01-11 DIAGNOSIS — Z01818 Encounter for other preprocedural examination: Secondary | ICD-10-CM | POA: Diagnosis not present

## 2023-01-11 DIAGNOSIS — M76891 Other specified enthesopathies of right lower limb, excluding foot: Secondary | ICD-10-CM | POA: Diagnosis not present

## 2023-01-20 DIAGNOSIS — Q6589 Other specified congenital deformities of hip: Secondary | ICD-10-CM | POA: Diagnosis not present

## 2023-02-01 DIAGNOSIS — M898X8 Other specified disorders of bone, other site: Secondary | ICD-10-CM | POA: Diagnosis not present

## 2023-02-01 DIAGNOSIS — E66812 Obesity, class 2: Secondary | ICD-10-CM | POA: Diagnosis not present

## 2023-02-01 DIAGNOSIS — M898X9 Other specified disorders of bone, unspecified site: Secondary | ICD-10-CM | POA: Diagnosis not present

## 2023-02-01 DIAGNOSIS — M898X5 Other specified disorders of bone, thigh: Secondary | ICD-10-CM | POA: Diagnosis not present

## 2023-02-01 DIAGNOSIS — F32A Depression, unspecified: Secondary | ICD-10-CM | POA: Diagnosis not present

## 2023-02-01 DIAGNOSIS — Z91041 Radiographic dye allergy status: Secondary | ICD-10-CM | POA: Diagnosis not present

## 2023-02-01 DIAGNOSIS — T8484XA Pain due to internal orthopedic prosthetic devices, implants and grafts, initial encounter: Secondary | ICD-10-CM | POA: Diagnosis not present

## 2023-02-01 DIAGNOSIS — M66351 Spontaneous rupture of flexor tendons, right thigh: Secondary | ICD-10-CM | POA: Diagnosis not present

## 2023-02-01 DIAGNOSIS — Z87892 Personal history of anaphylaxis: Secondary | ICD-10-CM | POA: Diagnosis not present

## 2023-02-01 DIAGNOSIS — G4700 Insomnia, unspecified: Secondary | ICD-10-CM | POA: Diagnosis not present

## 2023-02-01 DIAGNOSIS — I9581 Postprocedural hypotension: Secondary | ICD-10-CM | POA: Diagnosis not present

## 2023-02-01 DIAGNOSIS — Z6833 Body mass index (BMI) 33.0-33.9, adult: Secondary | ICD-10-CM | POA: Diagnosis not present

## 2023-02-01 DIAGNOSIS — Z888 Allergy status to other drugs, medicaments and biological substances status: Secondary | ICD-10-CM | POA: Diagnosis not present

## 2023-02-01 DIAGNOSIS — Z903 Acquired absence of stomach [part of]: Secondary | ICD-10-CM | POA: Diagnosis not present

## 2023-02-01 DIAGNOSIS — S76111A Strain of right quadriceps muscle, fascia and tendon, initial encounter: Secondary | ICD-10-CM | POA: Diagnosis not present

## 2023-02-01 DIAGNOSIS — Z91013 Allergy to seafood: Secondary | ICD-10-CM | POA: Diagnosis not present

## 2023-02-01 DIAGNOSIS — F419 Anxiety disorder, unspecified: Secondary | ICD-10-CM | POA: Diagnosis not present

## 2023-02-01 DIAGNOSIS — J45909 Unspecified asthma, uncomplicated: Secondary | ICD-10-CM | POA: Diagnosis not present

## 2023-02-01 DIAGNOSIS — Q6589 Other specified congenital deformities of hip: Secondary | ICD-10-CM | POA: Diagnosis not present

## 2023-02-01 DIAGNOSIS — Z8616 Personal history of COVID-19: Secondary | ICD-10-CM | POA: Diagnosis not present

## 2023-02-01 DIAGNOSIS — Z9884 Bariatric surgery status: Secondary | ICD-10-CM | POA: Diagnosis not present

## 2023-02-01 DIAGNOSIS — M61551 Other ossification of muscle, right thigh: Secondary | ICD-10-CM | POA: Diagnosis not present

## 2023-02-03 ENCOUNTER — Telehealth: Payer: Self-pay

## 2023-02-03 NOTE — Transitions of Care (Post Inpatient/ED Visit) (Signed)
02/03/2023  Name: Carrie Thompson MRN: [id number] DOB: 1990-10-09  Today's TOC FU Call Status: Today's TOC FU Call Status:: Successful TOC FU Call Completed TOC FU Call Complete Date: 02/03/23 Patient's Name and Date of Birth confirmed.  Transition Care Management Follow-up Telephone Call Date of Discharge: 02/02/23 Discharge Facility: Other [facility]) Name of Other (Non-[facility]) Discharge Facility: [facility] Type of Discharge: Inpatient Admission Primary Inpatient Discharge Diagnosis:: Revision of left hip How have you been since you were released from the hospital?: Better Any questions or concerns?: No  Items Reviewed: Did you receive and understand the discharge instructions provided?: Yes Medications obtained,verified, and reconciled?: Yes (Medications Reviewed) Any new allergies since your discharge?: No Dietary orders reviewed?: NA Do you have support at home?: Yes People in Home: parent(s), spouse Name of Support/Comfort Primary Source: Ivin Booty  Medications Reviewed Today: Medications Reviewed Today     Reviewed by Redge Gainer, RN (Case Manager) on 02/03/23 at [phone]  Med List Status: <None>   Medication Order Taking? Sig Documenting Provider Last Dose Status Informant  albuterol (VENTOLIN HFA) 108 (90 Base) MCG/ACT inhaler [id number] No Inhale 2 puffs into the lungs every 6 (six) hours as needed for wheezing or shortness of breath. Raechel Chute, MD Taking Active   amphetamine-dextroamphetamine (ADDERALL) 30 MG tablet [id number] No Take 1 tablet by mouth 2 (two) times daily.  Patient not taking: Reported on 01/01/2023   Lorre Munroe, NP Not Taking Active   desloratadine (CLARINEX) 5 MG tablet [id number] No Take 1 tablet (5 mg total) by mouth daily. Raechel Chute, MD Taking Active   EPINEPHrine 0.3 mg/0.3 mL IJ SOAJ injection [id number] No Inject 0.3 mg into the muscle as needed for anaphylaxis. Lorre Munroe, NP Taking Active   fluticasone  [facility]) 50 MCG/ACT nasal spray [id number] No Place 1 spray into both nostrils daily. Raechel Chute, MD Taking Active   levonorgestrel (MIRENA, 52 MG,) 20 MCG/DAY IUD [id number] No Mirena 20 mcg/24 hours (8 yrs) 52 mg intrauterine device  Take 1 device by intrauterine route. [provider] Taking Active   zolpidem (AMBIEN) 10 MG tablet [id number] No Take 1 tablet (10 mg total) by mouth at bedtime as needed for sleep. Smitty Cords, DO Taking Active             Home Care and Equipment/Supplies: Were Home Health Services Ordered?: No Any new equipment or medical supplies ordered?: No  Functional Questionnaire: Do you need assistance with bathing/showering or dressing?: Yes Do you need assistance with meal preparation?: Yes Do you need assistance with eating?: No Do you have difficulty maintaining continence: No Do you need assistance with getting out of bed/getting out of a chair/moving?: No Do you have difficulty managing or taking your medications?: No  Follow up appointments reviewed: PCP Follow-up appointment confirmed?: NA Specialist Hospital Follow-up appointment confirmed?: No Reason Specialist Follow-Up Not Confirmed: Patient has Specialist Provider Number and will Call for Appointment Do you need transportation to your follow-up appointment?: No Do you understand care options if your condition(s) worsen?: Yes-patient verbalized understanding  SDOH Interventions Today    Flowsheet Row Most Recent Value  SDOH Interventions   Food Insecurity Interventions Intervention Not Indicated  Transportation Interventions Intervention Not Indicated      The patient is at home recovering from hip surgery. She states this is the second time she has required surgery on this hip. Her parents are in town to help her. Her Ortho provider is with [facility] and she  will follow up with them. She will start outpatient PT next week.    Deidre Ala, RN Marine scientist [facility] [phone]

## 2023-02-20 DIAGNOSIS — Q6589 Other specified congenital deformities of hip: Secondary | ICD-10-CM | POA: Diagnosis not present

## 2023-02-22 DIAGNOSIS — M6281 Muscle weakness (generalized): Secondary | ICD-10-CM | POA: Diagnosis not present

## 2023-02-22 DIAGNOSIS — M25651 Stiffness of right hip, not elsewhere classified: Secondary | ICD-10-CM | POA: Diagnosis not present

## 2023-02-22 DIAGNOSIS — M25551 Pain in right hip: Secondary | ICD-10-CM | POA: Diagnosis not present

## 2023-02-22 DIAGNOSIS — R262 Difficulty in walking, not elsewhere classified: Secondary | ICD-10-CM | POA: Diagnosis not present

## 2023-03-01 ENCOUNTER — Other Ambulatory Visit: Payer: Self-pay | Admitting: Internal Medicine

## 2023-03-01 DIAGNOSIS — F5101 Primary insomnia: Secondary | ICD-10-CM

## 2023-03-02 ENCOUNTER — Encounter: Payer: Self-pay | Admitting: Internal Medicine

## 2023-03-03 NOTE — Telephone Encounter (Signed)
Requested medication (s) are due for refill today: yes  Requested medication (s) are on the active medication list: yes  Last refill:  11/27/22 #30 2 refills  Future visit scheduled: yes in 4 months  Notes to clinic:  not delegated per protocol. Do you want to refill Rx?     Requested Prescriptions  Pending Prescriptions Disp Refills   zolpidem (AMBIEN) 10 MG tablet [Pharmacy Med Name: ZOLPIDEM TARTRATE 10 MG TABLET] 30 tablet 0    Sig: TAKE 1 TABLET BY MOUTH AT BEDTIME AS NEEDED FOR SLEEP.     Not Delegated - Psychiatry:  Anxiolytics/Hypnotics Failed - 03/01/2023  8:46 PM      Failed - This refill cannot be delegated      Failed - Urine Drug Screen completed in last 360 days      Passed - Valid encounter within last 6 months    Recent Outpatient Visits           2 months ago Encounter for general adult medical examination with abnormal findings   [facility] [place], Salvadore Oxford, NP   9 months ago Pseudotumor cerebri   [place] [facility] [place], Salvadore Oxford, NP   1 year ago Chest tightness   [facility] [place], Salvadore Oxford, NP   1 year ago Encounter for general adult medical examination with abnormal findings   [facility] [place], Salvadore Oxford, NP   1 year ago Status post osteotomy   [facility] [place], Salvadore Oxford, NP       Future Appointments             In 4 months Ba[place], Salvadore Oxford, NP [facility], [facility]

## 2023-03-10 DIAGNOSIS — R262 Difficulty in walking, not elsewhere classified: Secondary | ICD-10-CM | POA: Diagnosis not present

## 2023-03-10 DIAGNOSIS — M6281 Muscle weakness (generalized): Secondary | ICD-10-CM | POA: Diagnosis not present

## 2023-03-10 DIAGNOSIS — M25651 Stiffness of right hip, not elsewhere classified: Secondary | ICD-10-CM | POA: Diagnosis not present

## 2023-03-10 DIAGNOSIS — M25551 Pain in right hip: Secondary | ICD-10-CM | POA: Diagnosis not present

## 2023-03-11 ENCOUNTER — Encounter (HOSPITAL_COMMUNITY): Payer: Self-pay | Admitting: *Deleted

## 2023-03-15 DIAGNOSIS — M25551 Pain in right hip: Secondary | ICD-10-CM | POA: Diagnosis not present

## 2023-03-15 DIAGNOSIS — M6281 Muscle weakness (generalized): Secondary | ICD-10-CM | POA: Diagnosis not present

## 2023-03-15 DIAGNOSIS — M25651 Stiffness of right hip, not elsewhere classified: Secondary | ICD-10-CM | POA: Diagnosis not present

## 2023-03-15 DIAGNOSIS — R262 Difficulty in walking, not elsewhere classified: Secondary | ICD-10-CM | POA: Diagnosis not present

## 2023-03-18 DIAGNOSIS — M25651 Stiffness of right hip, not elsewhere classified: Secondary | ICD-10-CM | POA: Diagnosis not present

## 2023-03-18 DIAGNOSIS — R262 Difficulty in walking, not elsewhere classified: Secondary | ICD-10-CM | POA: Diagnosis not present

## 2023-03-18 DIAGNOSIS — M25551 Pain in right hip: Secondary | ICD-10-CM | POA: Diagnosis not present

## 2023-03-18 DIAGNOSIS — M6281 Muscle weakness (generalized): Secondary | ICD-10-CM | POA: Diagnosis not present

## 2023-03-22 DIAGNOSIS — Q6589 Other specified congenital deformities of hip: Secondary | ICD-10-CM | POA: Diagnosis not present

## 2023-03-23 DIAGNOSIS — R262 Difficulty in walking, not elsewhere classified: Secondary | ICD-10-CM | POA: Diagnosis not present

## 2023-03-23 DIAGNOSIS — M25651 Stiffness of right hip, not elsewhere classified: Secondary | ICD-10-CM | POA: Diagnosis not present

## 2023-03-23 DIAGNOSIS — M6281 Muscle weakness (generalized): Secondary | ICD-10-CM | POA: Diagnosis not present

## 2023-03-23 DIAGNOSIS — M25551 Pain in right hip: Secondary | ICD-10-CM | POA: Diagnosis not present

## 2023-03-31 ENCOUNTER — Other Ambulatory Visit: Payer: Self-pay | Admitting: Internal Medicine

## 2023-03-31 DIAGNOSIS — F5101 Primary insomnia: Secondary | ICD-10-CM

## 2023-04-01 MED ORDER — ZOLPIDEM TARTRATE 10 MG PO TABS: 10.0000 mg | ORAL_TABLET | Freq: Every evening | ORAL | 0 refills | Status: DC | PRN

## 2023-04-02 DIAGNOSIS — M6281 Muscle weakness (generalized): Secondary | ICD-10-CM | POA: Diagnosis not present

## 2023-04-02 DIAGNOSIS — M25651 Stiffness of right hip, not elsewhere classified: Secondary | ICD-10-CM | POA: Diagnosis not present

## 2023-04-02 DIAGNOSIS — M25551 Pain in right hip: Secondary | ICD-10-CM | POA: Diagnosis not present

## 2023-04-02 DIAGNOSIS — R262 Difficulty in walking, not elsewhere classified: Secondary | ICD-10-CM | POA: Diagnosis not present

## 2023-04-09 DIAGNOSIS — M25651 Stiffness of right hip, not elsewhere classified: Secondary | ICD-10-CM | POA: Diagnosis not present

## 2023-04-09 DIAGNOSIS — R262 Difficulty in walking, not elsewhere classified: Secondary | ICD-10-CM | POA: Diagnosis not present

## 2023-04-09 DIAGNOSIS — M25551 Pain in right hip: Secondary | ICD-10-CM | POA: Diagnosis not present

## 2023-04-09 DIAGNOSIS — M6281 Muscle weakness (generalized): Secondary | ICD-10-CM | POA: Diagnosis not present

## 2023-04-16 DIAGNOSIS — M25551 Pain in right hip: Secondary | ICD-10-CM | POA: Diagnosis not present

## 2023-04-16 DIAGNOSIS — M6281 Muscle weakness (generalized): Secondary | ICD-10-CM | POA: Diagnosis not present

## 2023-04-16 DIAGNOSIS — Z4789 Encounter for other orthopedic aftercare: Secondary | ICD-10-CM | POA: Diagnosis not present

## 2023-04-16 DIAGNOSIS — R262 Difficulty in walking, not elsewhere classified: Secondary | ICD-10-CM | POA: Diagnosis not present

## 2023-04-16 DIAGNOSIS — M25651 Stiffness of right hip, not elsewhere classified: Secondary | ICD-10-CM | POA: Diagnosis not present

## 2023-04-22 DIAGNOSIS — Q6589 Other specified congenital deformities of hip: Secondary | ICD-10-CM | POA: Diagnosis not present

## 2023-04-23 DIAGNOSIS — R262 Difficulty in walking, not elsewhere classified: Secondary | ICD-10-CM | POA: Diagnosis not present

## 2023-04-23 DIAGNOSIS — M25551 Pain in right hip: Secondary | ICD-10-CM | POA: Diagnosis not present

## 2023-04-23 DIAGNOSIS — M25651 Stiffness of right hip, not elsewhere classified: Secondary | ICD-10-CM | POA: Diagnosis not present

## 2023-04-23 DIAGNOSIS — M6281 Muscle weakness (generalized): Secondary | ICD-10-CM | POA: Diagnosis not present

## 2023-04-29 ENCOUNTER — Ambulatory Visit: Payer: BC Managed Care – PPO | Admitting: Internal Medicine

## 2023-04-29 NOTE — Progress Notes (Deleted)
 Subjective:    Patient ID: Carrie Thompson, female    DOB: [birth year], 33 y.o.   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 8 g 2   amphetamine -dextroamphetamine  (ADDERALL) 30 MG tablet Take 1 tablet by mouth 2 (two) times daily. (Patient not taking: Reported on 01/01/2023) 60 tablet 0   desloratadine  (CLARINEX ) 5 MG tablet Take 1 tablet (5 mg total) by mouth daily. 30 tablet 11   EPINEPHrine  0.3 mg/0.3 mL IJ SOAJ injection Inject 0.3 mg into the muscle as needed for anaphylaxis. 1 each 0   fluticasone  (FLONASE ) 50 MCG/ACT nasal spray Place 1 spray into both nostrils daily. 18.2 mL 11   levonorgestrel  (MIRENA , 52 MG,) 20 MCG/DAY IUD Mirena  20 mcg/24 hours (8 yrs) 52 mg intrauterine device  Take 1 device by intrauterine route.     zolpidem  (AMBIEN ) 10 MG tablet Take 1 tablet (10 mg total) by mouth at bedtime as needed. for sleep 30 tablet 0   No current facility-administered medications for this visit.    Allergies  Allergen Reactions   Shellfish Allergy Anaphylaxis   Iodine Other (See Comments)    Pt states that she avoids iodine because of her reaction to shellfish.     Povidone-Iodine Other (See Comments)    Reports she avoids due to concern for anaphylaxis    Family History  Problem Relation Age of Onset   Varicose Veins Mother    Mental illness Mother    Diabetes Father    Mental illness Father    Rheum arthritis Maternal Grandmother    Hypertension Maternal Grandfather    Colon cancer Neg Hx    Breast cancer Neg  Hx     Social History   Socioeconomic History   Marital status: Married    Spouse name: Not on file   Number of children: Not on file   Years of education: Not on file   Highest education level: Bachelor's degree (e.g., BA, AB, BS)  Occupational History   Not on file  Tobacco Use   Smoking status: Never    Passive exposure: Never   Smokeless tobacco: Never  Vaping Use   Vaping status: Never Used  Substance and Sexual Activity   Alcohol use: No    Alcohol/week: 0.0 standard drinks of alcohol   Drug use: No   Sexual activity: Yes    Birth control/protection: I.U.D.  Other Topics Concern   Not on file  Social History Narrative   Not on file   Social Drivers of Health   Financial Resource Strain: Low Risk  (02/03/2023)   Received from [facility]   Overall Financial Resource Strain (CARDIA)    Difficulty of Paying Living Expenses: Not hard at all  Food Insecurity: No Food Insecurity (02/03/2023)   Hunger Vital Sign    Worried About Running Out of Food in the Last Year: Never true    Ran  Out of Food in the Last Year: Never true  Transportation Needs: Unmet Transportation Needs (02/03/2023)   [facility]    In the past 12 months, has lack of transportation kept you from medical appointments or from getting medications?: Yes    Lack of Transportation (Non-Medical): Yes  Physical Activity: Insufficiently Active (01/05/2023)   Received from [facility]   Exercise Vital Sign    Days of Exercise per Week: 1 day    Minutes of Exercise per Session: 40 min  Stress: No Stress Concern Present (01/05/2023)   Received from [facility] - Occupational Stress Questionnaire    Feeling of Stress : Only a little  Recent Concern: Stress - Stress Concern Present (01/01/2023)   [employer] of Occupational [facility] - Occupational  Stress Questionnaire    Feeling of Stress : To some extent  Social Connections: Socially Integrated (01/05/2023)   Received from [facility]   Social Connection and Isolation Panel [NHANES]    Frequency of Communication with Friends and Family: More than three times a week    Frequency of Social Gatherings with Friends and Family: More than three times a week    Attends Religious Services: More than 4 times per year    Active Member of [employer] or Organizations: Yes    Attends Engineer, Structural: More than 4 times per year    Marital Status: Married  Catering Manager Violence: Not on file     Constitutional: Denies fever, malaise, fatigue, headache or abrupt weight changes.  HEENT: Denies eye pain, eye redness, ear pain, ringing in the ears, wax buildup, runny nose, nasal congestion, bloody nose, or sore throat. Respiratory: Denies difficulty breathing, shortness of breath, cough or sputum production.   Cardiovascular: Denies chest pain, chest tightness, palpitations or swelling in the hands or feet.  Gastrointestinal: Denies abdominal pain, bloating, constipation, diarrhea or blood in the stool.  GU: Denies urgency, frequency, pain with urination, burning sensation, blood in urine, odor or discharge. Musculoskeletal: Patient reports chronic hip pain.  Denies decrease in range of motion, difficulty with gait, muscle pain or joint swelling.  Skin: Denies redness, rashes, lesions or ulcercations.  Neurological: Patient reports insomnia, inattention.  Denies dizziness, difficulty with memory, difficulty with speech or problems with balance and coordination.  Psych: Patient has a history of anxiety and depression.  Denies SI/HI.  No other specific complaints in a complete review of systems (except as listed in HPI above).  Objective:   Physical Exam   There were no vitals taken for this visit.  Wt Readings from Last 3 Encounters:  01/01/23 218 lb (98.9 kg)   10/27/22 221 lb (100.2 kg)  10/01/22 223 lb (101.2 kg)    General: Appears her stated age, obese, in NAD. Skin: Warm, dry and intact.  HEENT: Head: normal shape and size; Eyes: sclera white, no icterus, conjunctiva pink, PERRLA and EOMs intact;  Neck:  Neck supple, trachea midline. No masses, lumps or thyromegaly present.  Cardiovascular: Normal rate and rhythm. S1,S2 noted.  No murmur, rubs or gallops noted. No JVD or BLE edema.  Respiratory: Normal effort and positive vesicular breath sounds. No respiratory distress. No wheezes, rales or ronchi noted.  Abdomen: Soft and nontender. Normal bowel sounds.  Musculoskeletal: Strength 5/5 BUE/BLE no difficulty with gait.  Neurological: Alert and oriented. Cranial nerves II-XII grossly intact. Coordination normal.  Psychiatric: Mood and affect normal. Behavior is normal. Judgment and thought content normal.     BMET    Component Value Date/Time   NA 142 01/01/2023 0841   K 4.6 01/01/2023 0841   CL 106 01/01/2023 0841   CO2 28 01/01/2023 0841   GLUCOSE 84 01/01/2023 0841   BUN 14 01/01/2023 0841   CREATININE 0.75 01/01/2023 0841   CALCIUM 9.0 01/01/2023 0841   GFRNONAA >60 08/16/2019 0420   GFRAA >60 08/16/2019 0420    Lipid Panel     Component Value Date/Time   CHOL 156 01/01/2023 0841   TRIG 68 01/01/2023 0841   HDL 41 (L) 01/01/2023 0841   CHOLHDL 3.8 01/01/2023 0841   VLDL 11.6 06/03/2016 1455   LDLCALC 100 (H) 01/01/2023 0841    CBC    Component Value Date/Time   WBC 5.7 01/01/2023 0841   RBC 4.56 01/01/2023 0841   HGB 14.3 01/01/2023 0841   HGB 13.8 04/06/2022 1525   HCT 42.7 01/01/2023 0841   HCT 41.3 04/06/2022 1525   PLT 280 01/01/2023 0841   PLT 240 04/06/2022 1525   MCV 93.6 01/01/2023 0841   MCV 94 04/06/2022 1525   MCH 31.4 01/01/2023 0841   MCHC 33.5 01/01/2023 0841   RDW 11.2 01/01/2023 0841   RDW 12.2 04/06/2022 1525   LYMPHSABS 2.9 04/06/2022 1525   MONOABS 0.5 08/16/2019 0420   EOSABS 0.1  04/06/2022 1525   BASOSABS 0.1 04/06/2022 1525    Hgb A1C Lab Results  Component Value Date   HGBA1C 5.4 01/01/2023           Assessment & Plan:    RTC in 2 months, follow-up chronic conditions Angeline Laura, NP

## 2023-04-30 DIAGNOSIS — M25651 Stiffness of right hip, not elsewhere classified: Secondary | ICD-10-CM | POA: Diagnosis not present

## 2023-04-30 DIAGNOSIS — M25551 Pain in right hip: Secondary | ICD-10-CM | POA: Diagnosis not present

## 2023-04-30 DIAGNOSIS — R262 Difficulty in walking, not elsewhere classified: Secondary | ICD-10-CM | POA: Diagnosis not present

## 2023-04-30 DIAGNOSIS — M6281 Muscle weakness (generalized): Secondary | ICD-10-CM | POA: Diagnosis not present

## 2023-05-03 ENCOUNTER — Other Ambulatory Visit: Payer: Self-pay | Admitting: Internal Medicine

## 2023-05-03 DIAGNOSIS — F5101 Primary insomnia: Secondary | ICD-10-CM

## 2023-05-04 MED ORDER — ZOLPIDEM TARTRATE 10 MG PO TABS
10.0000 mg | ORAL_TABLET | Freq: Every evening | ORAL | 0 refills | Status: DC | PRN
Start: 2023-05-04 — End: 2023-06-01

## 2023-05-07 DIAGNOSIS — M25651 Stiffness of right hip, not elsewhere classified: Secondary | ICD-10-CM | POA: Diagnosis not present

## 2023-05-07 DIAGNOSIS — R262 Difficulty in walking, not elsewhere classified: Secondary | ICD-10-CM | POA: Diagnosis not present

## 2023-05-07 DIAGNOSIS — M6281 Muscle weakness (generalized): Secondary | ICD-10-CM | POA: Diagnosis not present

## 2023-05-07 DIAGNOSIS — M25551 Pain in right hip: Secondary | ICD-10-CM | POA: Diagnosis not present

## 2023-05-21 DIAGNOSIS — Q6589 Other specified congenital deformities of hip: Secondary | ICD-10-CM | POA: Diagnosis not present

## 2023-05-25 DIAGNOSIS — Z9889 Other specified postprocedural states: Secondary | ICD-10-CM | POA: Diagnosis not present

## 2023-06-01 ENCOUNTER — Other Ambulatory Visit: Payer: Self-pay | Admitting: Internal Medicine

## 2023-06-01 DIAGNOSIS — F5101 Primary insomnia: Secondary | ICD-10-CM

## 2023-06-02 MED ORDER — ZOLPIDEM TARTRATE 10 MG PO TABS
10.0000 mg | ORAL_TABLET | Freq: Every evening | ORAL | 0 refills | Status: DC | PRN
Start: 2023-06-02 — End: 2023-06-18

## 2023-06-18 ENCOUNTER — Encounter: Payer: Self-pay | Admitting: Internal Medicine

## 2023-06-18 ENCOUNTER — Ambulatory Visit: Admitting: Internal Medicine

## 2023-06-18 VITALS — BP 130/72 | Ht 68.0 in | Wt 213.6 lb

## 2023-06-18 DIAGNOSIS — Z6832 Body mass index (BMI) 32.0-32.9, adult: Secondary | ICD-10-CM

## 2023-06-18 DIAGNOSIS — J4520 Mild intermittent asthma, uncomplicated: Secondary | ICD-10-CM

## 2023-06-18 DIAGNOSIS — F900 Attention-deficit hyperactivity disorder, predominantly inattentive type: Secondary | ICD-10-CM | POA: Diagnosis not present

## 2023-06-18 DIAGNOSIS — M25551 Pain in right hip: Secondary | ICD-10-CM

## 2023-06-18 DIAGNOSIS — G932 Benign intracranial hypertension: Secondary | ICD-10-CM

## 2023-06-18 DIAGNOSIS — E66811 Obesity, class 1: Secondary | ICD-10-CM

## 2023-06-18 DIAGNOSIS — F5101 Primary insomnia: Secondary | ICD-10-CM

## 2023-06-18 DIAGNOSIS — F419 Anxiety disorder, unspecified: Secondary | ICD-10-CM

## 2023-06-18 DIAGNOSIS — G8929 Other chronic pain: Secondary | ICD-10-CM

## 2023-06-18 DIAGNOSIS — M25552 Pain in left hip: Secondary | ICD-10-CM

## 2023-06-18 DIAGNOSIS — E6609 Other obesity due to excess calories: Secondary | ICD-10-CM

## 2023-06-18 DIAGNOSIS — F32A Depression, unspecified: Secondary | ICD-10-CM

## 2023-06-18 MED ORDER — AMPHETAMINE-DEXTROAMPHETAMINE 30 MG PO TABS: 1.0000 | ORAL_TABLET | Freq: Three times a day (TID) | ORAL | 0 refills | Status: DC

## 2023-06-18 MED ORDER — ZOLPIDEM TARTRATE 10 MG PO TABS: 10.0000 mg | ORAL_TABLET | Freq: Every evening | ORAL | 0 refills | Status: DC | PRN

## 2023-06-18 NOTE — Patient Instructions (Signed)
 Living With Attention Deficit Hyperactivity Disorder If you have been diagnosed with attention deficit hyperactivity disorder (ADHD), you may be relieved that you now know why you have felt or behaved a certain way. Still, you may feel overwhelmed about the treatment ahead. You may also wonder how to get the support you need and how to deal with the condition day-to-day. With treatment and support, you can live with ADHD and manage your symptoms. How to manage lifestyle changes Managing lifestyle changes can be challenging. Seeking support from your healthcare provider, therapist, family, and friends can be helpful. How to recognize changes in your condition The following signs may mean that your treatment is working well and your condition is improving: Consistently being on time for appointments. Being more organized at home and work. Other people noticing improvements in your behavior. Achieving goals that you set for yourself. Thinking more clearly. The following signs may mean that your treatment is not working very well: Feeling impatience or more confusion. Missing, forgetting, or being late for appointments. An increasing sense of disorganization and messiness. More difficulty in reaching goals that you set for yourself. Loved ones becoming angry or frustrated with you. Follow these instructions at home: Medicines Take over-the-counter and prescription medicines only as told by your health care provider. Check with your health care provider before taking any new medicines. General instructions Create structure and an organized atmosphere at home. For example: Make a list of tasks, then rank them from most important to least important. Work on one task at a time until your listed tasks are done. Make a daily schedule and follow it consistently every day. Use an appointment calendar, and check it 2-3 times a day to keep on track. Keep it with you when you leave the house. Create  spaces where you keep certain things, and always put things back in their places after you use them. Keep all follow-up visits. Your health care provider will need to monitor your condition and adjust your treatment over time. Where to find support Talking to others  Keep emotion out of important discussions and speak in a calm, logical way. Listen closely and patiently to your loved ones. Try to understand their point of view, and try to avoid getting defensive. Take responsibility for the consequences of your actions. Ask that others do not take your behaviors personally. Aim to solve problems as they come up, and express your feelings instead of bottling them up. Talk openly about what you need from your loved ones and how they can support you. Consider going to family therapy sessions or having your family meet with a specialist who deals with ADHD-related behavior problems. Finances Not all insurance plans cover mental health care, so it is important to check with your insurance carrier. If paying for co-pays or counseling services is a problem, search for a local or county mental health care center. Public mental health care services may be offered there at a low cost or no cost when you are not able to see a private health care provider. If you are taking medicine for ADHD, you may be able to get the generic form, which may be less expensive than brand-name medicine. Some makers of prescription medicines also offer help to patients who cannot afford the medicines that they need. Therapy and support groups Talking with a mental health care provider and participating in support groups can help to improve your quality of life, daily functioning, and overall symptoms. Questions to ask your health  care provider: What are the risks and benefits of taking medicines? Would I benefit from therapy? How often should I follow up with a health care provider? Where to find more information Learn more  about ADHD from: Children and Adults with Attention Deficit Hyperactivity Disorder: [employer] of Mental Health: BloggerCourse.com Centers for Disease Control and Prevention: TonerPromos.no Contact a health care provider if: You have side effects from your medicines, such as: Repeated muscle twitches, coughing, or speech outbursts. Sleep problems. Loss of appetite. Dizziness. Unusually fast heartbeat. Stomach pains. Headaches. You have new or worsening behavior problems. You are struggling with anxiety, depression, or substance abuse. Get help right away if: You have a severe reaction to a medicine. These symptoms may be an emergency. Get help right away. Call 911. Do not wait to see if the symptoms will go away. Do not drive yourself to the hospital. Take one of these steps if you feel like you may hurt yourself or others, or have thoughts about taking your own life: Go to your nearest emergency room. Call 911. Call the National Suicide Prevention Lifeline at (989) 115-0802 or 988. This is open 24 hours a day. Text the Crisis Text Line at 802-008-3988. Summary With treatment and support, you can live with ADHD and manage your symptoms. Consider taking part in family therapy or self-help groups with family members or friends. When you talk with friends and family about your ADHD, be patient and communicate openly. Keep all follow-up visits. Your health care provider will need to monitor your condition and adjust your treatment over time. This information is not intended to replace advice given to you by your health care provider. Make sure you discuss any questions you have with your health care provider. Document Revised: 06/27/2021 Document Reviewed: 06/27/2021 Elsevier Patient Education  2024 [employer].

## 2023-06-18 NOTE — Assessment & Plan Note (Signed)
 Encouraged diet and exercise for weight loss ?

## 2023-06-18 NOTE — Assessment & Plan Note (Signed)
Currently not an issue off meds °

## 2023-06-18 NOTE — Assessment & Plan Note (Signed)
Continue Ambien. 

## 2023-06-18 NOTE — Assessment & Plan Note (Signed)
 She has albuterol inhaler to use if needed

## 2023-06-18 NOTE — Assessment & Plan Note (Signed)
 Continue Adderall as needed.

## 2023-06-18 NOTE — Progress Notes (Signed)
 Subjective:    Patient ID: Carrie Thompson, female    DOB: Feb 01, 1991, 33 y.o.   MRN: [id number]  HPI  Patient presents to clinic today for follow-up of chronic conditions.  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  She denies cough or shortness of breath.  She is not using any inhalers at this time but has albuterol if needed.  There are no PFTs on file.  Pseudotumor cerebri: She denies headaches or dizziness.  She is not taking any medications for this.  She does not follow with neurology.  Anxiety and depression: In remission.  She is not currently taking any medications for this.  She is not seeing a therapist.  She denies SI/HI.  ADD: She reports mainly inattention.  She no longer takes adderall but she would like to get started on this.  She does not follow with psychiatry.  Insomnia: She has trouble falling and staying asleep.  She takes [place] as prescribed with good relief of symptoms.  There is no sleep study on file.  Chronic hip pain: Improved status post surgery. She is not currently taking any medication for this.  She follows with orthopedics.  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 8 g 2   amphetamine-dextroamphetamine (ADDERALL) 30 MG tablet Take 1 tablet by mouth 2 (two) times daily. (Patient not taking: Reported on 01/01/2023) 60 tablet 0   desloratadine (CLARINEX) 5 MG tablet Take 1 tablet (5 mg total) by mouth daily. 30 tablet 11   EPINEPHrine 0.3 mg/0.3 mL IJ SOAJ injection Inject 0.3 mg into the muscle as needed for anaphylaxis. 1  each 0   fluticasone (FLONASE) 50 MCG/ACT nasal spray Place 1 spray into both nostrils daily. 18.2 mL 11   levonorgestrel (MIRENA, 52 MG,) 20 MCG/DAY IUD Mirena 20 mcg/24 hours (8 yrs) 52 mg intrauterine device  Take 1 device by intrauterine route.     zolpidem (AMBIEN) 10 MG tablet Take 1 tablet (10 mg total) by mouth at bedtime as needed. for sleep 30 tablet 0   No current facility-administered medications for this visit.    Allergies  Allergen Reactions   Shellfish Allergy Anaphylaxis   Iodine Other (See Comments)    Pt states that she avoids iodine because of her reaction to shellfish.     Povidone-Iodine Other (See Comments)    Reports she avoids due to concern for anaphylaxis    Family History  Problem Relation Age of Onset   Varicose Veins Mother    Mental illness Mother    Diabetes Father    Mental illness Father    Rheum arthritis Maternal Grandmother    Hypertension Maternal Grandfather    Colon cancer Neg Hx    Breast cancer Neg Hx     Social History   Socioeconomic History   Marital status: Married    Spouse name: Not on file   Number of children: Not on file   Years of education:  Not on file   Highest education level: Bachelor's degree (e.g., BA, AB, BS)  Occupational History   Not on file  Tobacco Use   Smoking status: Never    Passive exposure: Never   Smokeless tobacco: Never  Vaping Use   Vaping status: Never Used  Substance and Sexual Activity   Alcohol use: No    Alcohol/week: 0.0 standard drinks of alcohol   Drug use: No   Sexual activity: Yes    Birth control/protection: I.U.D.  Other Topics Concern   Not on file  Social History Narrative   Not on file   Social Drivers of Health   Financial Resource Strain: Low Risk  (02/03/2023)   Received from [facility]   Overall Financial Resource Strain (CARDIA)    Difficulty of Paying Living Expenses: Not hard at all  Food Insecurity: No Food Insecurity (02/03/2023)   Hunger  Vital Sign    Worried About Running Out of Food in the Last Year: Never true    Ran Out of Food in the Last Year: Never true  Transportation Needs: Unmet Transportation Needs (02/03/2023)   Received from [facility] - Transportation    In the past 12 months, has lack of transportation kept you from medical appointments or from getting medications?: Yes    Lack of Transportation (Non-Medical): Yes  Physical Activity: Insufficiently Active (01/05/2023)   Received from [facility]   Exercise Vital Sign    Days of Exercise per Week: 1 day    Minutes of Exercise per Session: 40 min  Stress: No Stress Concern Present (01/05/2023)   Received from [facility] - Occupational Stress Questionnaire    Feeling of Stress : Only a little  Recent Concern: Stress - Stress Concern Present (01/01/2023)   [employer] of Occupational Health - Occupational Stress Questionnaire    Feeling of Stress : To some extent  Social Connections: Socially Integrated (01/05/2023)   Received from [facility]   Social Connection and Isolation Panel [NHANES]    Frequency of Communication with Friends and Family: More than three times a week    Frequency of Social Gatherings with Friends and Family: More than three times a week    Attends Religious Services: More than 4 times per year    Active Member of [employer] or Organizations: Yes    Attends Engineer, structural: More than 4 times per year    Marital Status: Married  Catering manager Violence: Not on file     Constitutional: Denies fever, malaise, fatigue, headache or abrupt weight changes.  HEENT: Denies eye pain, eye redness, ear pain, ringing in the ears, wax buildup, runny nose, nasal congestion, bloody nose, or sore throat. Respiratory: Denies difficulty breathing, shortness of breath, cough or sputum production.    Cardiovascular: Denies chest pain, chest tightness, palpitations or swelling in the hands or feet.  Gastrointestinal: Denies abdominal pain, bloating, constipation, diarrhea or blood in the stool.  GU: Denies urgency, frequency, pain with urination, burning sensation, blood in urine, odor or discharge. Musculoskeletal: Denies decrease in range of motion, difficulty with gait, muscle pain or joint pain and swelling.  Skin: Denies redness, rashes, lesions or ulcercations.  Neurological: Patient reports insomnia, inattention.  Denies dizziness, difficulty with memory, difficulty with speech or problems with balance and coordination.  Psych: Patient has a history of anxiety and depression.  Denies anxiety, depression,  SI/HI.  No other specific complaints in a complete review of systems (except as listed in HPI above).  Objective:   Physical Exam  BP 130/72 (BP Location: Left Arm, Patient Position: Sitting, Cuff Size: Normal)   Ht 5\' 8"  (1.727 m)   Wt 213 lb 9.6 oz (96.9 kg)   LMP  (LMP Unknown)   BMI 32.48 kg/m    Wt Readings from Last 3 Encounters:  01/01/23 218 lb (98.9 kg)  10/27/22 221 lb (100.2 kg)  10/01/22 223 lb (101.2 kg)    General: Appears her stated age, obese, in NAD. Skin: Warm, dry and intact.  HEENT: Head: normal shape and size; Eyes: sclera white, no icterus, conjunctiva pink, PERRLA and EOMs intact;  Cardiovascular: Normal rate and rhythm. S1,S2 noted.  No murmur, rubs or gallops noted.  Pulmonary/Chest: Normal effort and positive vesicular breath sounds. No respiratory distress. No wheezes, rales or ronchi noted.  Musculoskeletal: No difficulty with gait. Neurological: Alert and oriented. Coordination normal.  Psychiatric: Mood and affect normal. Behavior is normal. Judgment and thought content normal.     BMET    Component Value Date/Time   NA 142 01/01/2023 0841   K 4.6 01/01/2023 0841   CL 106 01/01/2023 0841   CO2 28 01/01/2023 0841   GLUCOSE 84  01/01/2023 0841   BUN 14 01/01/2023 0841   CREATININE 0.75 01/01/2023 0841   CALCIUM 9.0 01/01/2023 0841   GFRNONAA >60 08/16/2019 0420   GFRAA >60 08/16/2019 0420    Lipid Panel     Component Value Date/Time   CHOL 156 01/01/2023 0841   TRIG 68 01/01/2023 0841   HDL 41 (L) 01/01/2023 0841   CHOLHDL 3.8 01/01/2023 0841   VLDL 11.6 06/03/2016 1455   LDLCALC 100 (H) 01/01/2023 0841    CBC    Component Value Date/Time   WBC 5.7 01/01/2023 0841   RBC 4.56 01/01/2023 0841   HGB 14.3 01/01/2023 0841   HGB 13.8 04/06/2022 1525   HCT 42.7 01/01/2023 0841   HCT 41.3 04/06/2022 1525   PLT 280 01/01/2023 0841   PLT 240 04/06/2022 1525   MCV 93.6 01/01/2023 0841   MCV 94 04/06/2022 1525   MCH 31.4 01/01/2023 0841   MCHC 33.5 01/01/2023 0841   RDW 11.2 01/01/2023 0841   RDW 12.2 04/06/2022 1525   LYMPHSABS 2.9 04/06/2022 1525   MONOABS 0.5 08/16/2019 0420   EOSABS 0.1 04/06/2022 1525   BASOSABS 0.1 04/06/2022 1525    Hgb A1C Lab Results  Component Value Date   HGBA1C 5.4 01/01/2023            Assessment & Plan:     RTC in 6 months for your annual exam Nicki Reaper, NP

## 2023-06-18 NOTE — Assessment & Plan Note (Signed)
 Improved status post surgery

## 2023-07-02 ENCOUNTER — Ambulatory Visit: Payer: Self-pay | Admitting: Internal Medicine

## 2023-07-13 ENCOUNTER — Telehealth: Admitting: Family Medicine

## 2023-07-13 ENCOUNTER — Telehealth: Admitting: Physician Assistant

## 2023-07-13 DIAGNOSIS — H1031 Unspecified acute conjunctivitis, right eye: Secondary | ICD-10-CM | POA: Diagnosis not present

## 2023-07-13 DIAGNOSIS — Z91199 Patient's noncompliance with other medical treatment and regimen due to unspecified reason: Secondary | ICD-10-CM

## 2023-07-13 MED ORDER — POLYMYXIN B-TRIMETHOPRIM 10000-0.1 UNIT/ML-% OP SOLN: 1.0000 [drp] | OPHTHALMIC | 0 refills | Status: AC

## 2023-07-13 NOTE — Progress Notes (Signed)
Patient was seen via e-visit.  Closing encounter.

## 2023-07-13 NOTE — Progress Notes (Signed)

## 2023-09-30 ENCOUNTER — Encounter: Payer: Self-pay | Admitting: Internal Medicine

## 2023-09-30 ENCOUNTER — Other Ambulatory Visit: Payer: Self-pay | Admitting: Internal Medicine

## 2023-09-30 DIAGNOSIS — F5101 Primary insomnia: Secondary | ICD-10-CM

## 2023-10-01 MED ORDER — AMPHETAMINE-DEXTROAMPHETAMINE 30 MG PO TABS: 1.0000 | ORAL_TABLET | Freq: Three times a day (TID) | ORAL | 0 refills | Status: DC

## 2023-10-01 MED ORDER — ZOLPIDEM TARTRATE 10 MG PO TABS: 10.0000 mg | ORAL_TABLET | Freq: Every evening | ORAL | 0 refills | Status: DC | PRN

## 2023-12-22 ENCOUNTER — Encounter: Payer: Self-pay | Admitting: Internal Medicine

## 2023-12-22 ENCOUNTER — Ambulatory Visit (INDEPENDENT_AMBULATORY_CARE_PROVIDER_SITE_OTHER): Admitting: Internal Medicine

## 2023-12-22 VITALS — BP 130/74 | Ht 68.0 in | Wt 202.6 lb

## 2023-12-22 DIAGNOSIS — Z0001 Encounter for general adult medical examination with abnormal findings: Secondary | ICD-10-CM

## 2023-12-22 DIAGNOSIS — E66811 Obesity, class 1: Secondary | ICD-10-CM

## 2023-12-22 DIAGNOSIS — Z6830 Body mass index (BMI) 30.0-30.9, adult: Secondary | ICD-10-CM

## 2023-12-22 DIAGNOSIS — R739 Hyperglycemia, unspecified: Secondary | ICD-10-CM | POA: Diagnosis not present

## 2023-12-22 DIAGNOSIS — Z136 Encounter for screening for cardiovascular disorders: Secondary | ICD-10-CM | POA: Diagnosis not present

## 2023-12-22 DIAGNOSIS — E6609 Other obesity due to excess calories: Secondary | ICD-10-CM

## 2023-12-22 MED ORDER — ZOLPIDEM TARTRATE ER 12.5 MG PO TBCR: 12.5000 mg | EXTENDED_RELEASE_TABLET | Freq: Every evening | ORAL | 0 refills | Status: DC | PRN

## 2023-12-22 NOTE — Assessment & Plan Note (Signed)
 Encouraged diet and exercise for weight loss ?

## 2023-12-22 NOTE — Patient Instructions (Signed)

## 2023-12-22 NOTE — Progress Notes (Signed)
 Subjective:    Patient ID: Carrie Thompson, female    DOB: [birth year], 33 y.o.   MRN: [id number]  HPI  Patient presents to clinic today for her annual exam.  Flu: Never Tetanus: 04/2012 COVID: X 1 Pap smear: 2021, [facility] Dentist: biannually  Diet: She does eat meat. She consumes fruits and veggies. She tries to avoid fried foods. She drinks mostly water. Exercise: 1-2x per week  Review of Systems     Past Medical History:  Diagnosis Date  . Abnormal Pap smear   . Anxiety   . Chicken pox   . Depression   . Hx of varicella   . Seizures (HCC)    hx pseudotumor treated in 2011 with meds, never had seizure  . Vaginal Pap smear, abnormal     Current Outpatient Medications  Medication Sig Dispense Refill  . albuterol  (VENTOLIN  HFA) 108 (90 Base) MCG/ACT inhaler Inhale 2 puffs into the lungs every 6 (six) hours as needed for wheezing or shortness of breath. 8 g 2  . amphetamine -dextroamphetamine  (ADDERALL) 30 MG tablet Take 1 tablet by mouth 3 (three) times daily. 90 tablet 0  . desloratadine  (CLARINEX ) 5 MG tablet Take 1 tablet (5 mg total) by mouth daily. 30 tablet 11  . EPINEPHrine  0.3 mg/0.3 mL IJ SOAJ injection Inject 0.3 mg into the muscle as needed for anaphylaxis. 1 each 0  . fluticasone  (FLONASE ) 50 MCG/ACT nasal spray Place 1 spray into both nostrils daily. 18.2 mL 11  . levonorgestrel  (MIRENA , 52 MG,) 20 MCG/DAY IUD Mirena  20 mcg/24 hours (8 yrs) 52 mg intrauterine device  Take 1 device by intrauterine route.    . zolpidem  (AMBIEN ) 10 MG tablet Take 1 tablet (10 mg total) by mouth at bedtime as needed. for sleep 90 tablet 0   No current facility-administered medications for this visit.    Allergies  Allergen Reactions  . Shellfish Allergy Anaphylaxis  . Cyclobenzaprine  Hives and Itching  . Iodine Other (See Comments)    Pt states that she avoids iodine because of her reaction to shellfish.    . Povidone-Iodine Other (See Comments)    Reports  she avoids due to concern for anaphylaxis    Family History  Problem Relation Age of Onset  . Varicose Veins Mother   . Mental illness Mother   . Diabetes Father   . Mental illness Father   . Rheum arthritis Maternal Grandmother   . Hypertension Maternal Grandfather   . Colon cancer Neg Hx   . Breast cancer Neg Hx     Social History   Socioeconomic History  . Marital status: Married    Spouse name: Not on file  . Number of children: Not on file  . Years of education: Not on file  . Highest education level: Bachelor's degree (e.g., BA, AB, BS)  Occupational History  . Not on file  Tobacco Use  . Smoking status: Never    Passive exposure: Never  . Smokeless tobacco: Never  Vaping Use  . Vaping status: Never Used  Substance and Sexual Activity  . Alcohol use: No    Alcohol/week: 0.0 standard drinks of alcohol  . Drug use: No  . Sexual activity: Yes    Birth control/protection: I.U.D.  Other Topics Concern  . Not on file  Social History Narrative  . Not on file   Social Drivers of Health   Financial Resource Strain: Low Risk  (02/03/2023)   Received from [facility]   Overall Financial Resource Strain (CARDIA)   . Difficulty of Paying Living Expenses: Not hard at all  Food Insecurity: No Food Insecurity (02/03/2023)   Received from [facility]   Hunger Vital Sign   . Within the past 12 months, you worried that your food would run out before you got the money to buy more.: Never true   . Within the past 12 months, the food you bought just didn't last and you didn't have money to get more.: Never true  Transportation Needs: Unmet Transportation Needs (02/03/2023)   Received from [facility]   [facility] - Transportation   . In the past 12 months, has lack of transportation kept you from medical appointments or from getting medications?: Yes   . Lack of Transportation (Non-Medical): Yes  Physical Activity:  Insufficiently Active (01/05/2023)   Received from [facility]   Exercise Vital Sign   . On average, how many days per week do you engage in moderate to strenuous exercise (like a brisk walk)?: 1 day   . On average, how many minutes do you engage in exercise at this level?: 40 min  Stress: No Stress Concern Present (01/05/2023)   Received from [facility] - Occupational Stress Questionnaire   . Feeling of Stress : Only a little  Recent Concern: Stress - Stress Concern Present (01/01/2023)   [employer] of Occupational Health - Occupational Stress Questionnaire   . Feeling of Stress : To some extent  Social Connections: Socially Integrated (01/05/2023)   Received from [facility]   Social Connection and Isolation Panel   . In a typical week, how many times do you talk on the phone with family, friends, or neighbors?: More than three times a week   . How often do you get together with friends or relatives?: More than three times a week   . How often do you attend church or religious services?: More than 4 times per year   . Do you belong to any clubs or organizations such as church groups, unions, fraternal or athletic groups, or school groups?: Yes   . How often do you attend meetings of the clubs or organizations you belong to?: More than 4 times per year   . Are you married, widowed, divorced, separated, never married, or living with a partner?: Married  Intimate Partner Violence: Not on file     Constitutional: Denies fever, malaise, fatigue, headache or abrupt weight changes.  HEENT: Denies eye pain, eye redness, ear pain, ringing in the ears, wax buildup, runny nose, nasal congestion, bloody nose, or sore throat. Respiratory: Denies difficulty breathing, shortness of breath, cough or sputum production.   Cardiovascular: Denies chest pain, chest tightness, palpitations or swelling in  the hands or feet.  Gastrointestinal: Denies abdominal pain, bloating, constipation, diarrhea or blood in the stool.  GU: Denies urgency, frequency, pain with urination, burning sensation, blood in urine, odor or discharge. Musculoskeletal: Denies decrease in range of motion, difficulty with gait, muscle pain or joint swelling.  Skin: Denies redness, rashes, lesions or ulcercations.  Neurological: Patient reports insomnia, inattention.  Denies dizziness, difficulty with memory, difficulty with speech or problems with balance and coordination.  Psych: Patient has a history of anxiety and depression.  Denies SI/HI.  No other specific complaints in a complete review of systems (except as listed in HPI above).  Objective:   Physical Exam  BP 130/74 (BP Location: Left Arm, Patient Position: Sitting, Cuff Size: Normal)   Ht 5' 8 (1.727 m)   Wt 202 lb 9.6 oz (91.9 kg)   LMP  (LMP Unknown)   BMI 30.81 kg/m    Wt Readings from Last 3 Encounters:  06/18/23 213 lb 9.6 oz (96.9 kg)  01/01/23 218 lb (98.9 kg)  10/27/22 221 lb (100.2 kg)    General: Appears her stated age, obese, in NAD. Skin: Warm, dry and intact.  HEENT: Head: normal shape and size; Eyes: sclera white, no icterus, conjunctiva pink, PERRLA and EOMs intact;  Neck:  Neck supple, trachea midline. No masses, lumps or thyromegaly present.  Cardiovascular: Normal rate and rhythm. S1,S2 noted.  No murmur, rubs or gallops noted. No JVD or BLE edema.  Respiratory: Normal effort and positive vesicular breath sounds. No respiratory distress. No wheezes, rales or ronchi noted.  Abdomen: Soft and nontender. Normal bowel sounds.  Musculoskeletal: Strength 5/5 BUE/BLE no difficulty with gait.  Neurological: Alert and oriented. Cranial nerves II-XII grossly intact. Coordination normal.  Psychiatric: Mood and affect normal. Behavior is normal. Judgment and thought content normal.     BMET    Component Value Date/Time   NA 142 01/01/2023  0841   K 4.6 01/01/2023 0841   CL 106 01/01/2023 0841   CO2 28 01/01/2023 0841   GLUCOSE 84 01/01/2023 0841   BUN 14 01/01/2023 0841   CREATININE 0.75 01/01/2023 0841   CALCIUM 9.0 01/01/2023 0841   GFRNONAA >60 08/16/2019 0420   GFRAA >60 08/16/2019 0420    Lipid Panel     Component Value Date/Time   CHOL 156 01/01/2023 0841   TRIG 68 01/01/2023 0841   HDL 41 (L) 01/01/2023 0841   CHOLHDL 3.8 01/01/2023 0841   VLDL 11.6 06/03/2016 1455   LDLCALC 100 (H) 01/01/2023 0841    CBC    Component Value Date/Time   WBC 5.7 01/01/2023 0841   RBC 4.56 01/01/2023 0841   HGB 14.3 01/01/2023 0841   HGB 13.8 04/06/2022 1525   HCT 42.7 01/01/2023 0841   HCT 41.3 04/06/2022 1525   PLT 280 01/01/2023 0841   PLT 240 04/06/2022 1525   MCV 93.6 01/01/2023 0841   MCV 94 04/06/2022 1525   MCH 31.4 01/01/2023 0841   MCHC 33.5 01/01/2023 0841   RDW 11.2 01/01/2023 0841   RDW 12.2 04/06/2022 1525   LYMPHSABS 2.9 04/06/2022 1525   MONOABS 0.5 08/16/2019 0420   EOSABS 0.1 04/06/2022 1525   BASOSABS 0.1 04/06/2022 1525    Hgb A1C Lab Results  Component Value Date   HGBA1C 5.4 01/01/2023           Assessment & Plan:   Preventative health maintenance:  Flu shot declined Tetanus declined Encouraged her to get her COVID booster Pap smear UTD Encouraged her to consume a balanced diet and exercise regimen Advised her to see an eye doctor and dentist annually Will check CBC, c-Met, lipid, A1c  today  RTC in 6 months, follow-up chronic conditions Angeline Laura, NP

## 2023-12-31 ENCOUNTER — Encounter: Payer: Self-pay | Admitting: Internal Medicine

## 2023-12-31 MED ORDER — AMPHETAMINE-DEXTROAMPHETAMINE 30 MG PO TABS: 30.0000 mg | ORAL_TABLET | Freq: Every day | ORAL | 0 refills | Status: DC

## 2024-01-19 ENCOUNTER — Other Ambulatory Visit: Payer: Self-pay | Admitting: Internal Medicine

## 2024-01-21 NOTE — Telephone Encounter (Signed)
 Requested medication (s) are due for refill today: yes  Requested medication (s) are on the active medication list: yes  Last refill:  12/22/23  Future visit scheduled: {Yes  Notes to clinic:  Unable to refill per protocol, cannot delegate.      Requested Prescriptions  Pending Prescriptions Disp Refills   zolpidem  (AMBIEN  CR) 12.5 MG CR tablet [Pharmacy Med Name: ZOLPIDEM  TART ER 12.5 MG TAB] 30 tablet 0    Sig: TAKE 1 TABLET BY MOUTH AT BEDTIME AS NEEDED FOR SLEEP.     Not Delegated - Psychiatry:  Anxiolytics/Hypnotics Failed - 01/21/2024 12:40 PM      Failed - This refill cannot be delegated      Failed - Urine Drug Screen completed in last 360 days      Passed - Valid encounter within last 6 months    Recent Outpatient Visits           1 month ago Encounter for general adult medical examination with abnormal findings   [place] 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

## 2024-02-09 ENCOUNTER — Encounter: Payer: Self-pay | Admitting: Internal Medicine

## 2024-02-09 MED ORDER — AMPHETAMINE-DEXTROAMPHETAMINE 30 MG PO TABS: 30.0000 mg | ORAL_TABLET | Freq: Every day | ORAL | 0 refills | Status: AC

## 2024-02-20 ENCOUNTER — Other Ambulatory Visit: Payer: Self-pay | Admitting: Internal Medicine

## 2024-03-27 ENCOUNTER — Encounter (HOSPITAL_COMMUNITY): Payer: Self-pay | Admitting: *Deleted

## 2024-03-27 ENCOUNTER — Other Ambulatory Visit: Payer: Self-pay | Admitting: Internal Medicine

## 2024-03-28 ENCOUNTER — Encounter: Payer: Self-pay | Admitting: Internal Medicine

## 2024-03-28 MED ORDER — ZOLPIDEM TARTRATE ER 12.5 MG PO TBCR: 12.5000 mg | EXTENDED_RELEASE_TABLET | Freq: Every evening | ORAL | 0 refills | Status: DC | PRN

## 2024-03-28 NOTE — Telephone Encounter (Signed)
 Requested medications are due for refill today.  no  Requested medications are on the active medications list.  yes  Last refill. today  Future visit scheduled.   yes  Notes to clinic.  Refusal not delegated.    Requested Prescriptions  Pending Prescriptions Disp Refills   zolpidem  (AMBIEN  CR) 12.5 MG CR tablet [Pharmacy Med Name: ZOLPIDEM  TART ER 12.5 MG TAB] 30 tablet 0    Sig: TAKE 1 TABLET BY MOUTH AT BEDTIME AS NEEDED FOR SLEEP     Not Delegated - Psychiatry:  Anxiolytics/Hypnotics Failed - 03/28/2024  5:19 PM      Failed - This refill cannot be delegated      Failed - Urine Drug Screen completed in last 360 days      Passed - Valid encounter within last 6 months    Recent Outpatient Visits           3 months ago Encounter for general adult medical examination with abnormal findings   [place] [facility] [place], [place], NP   9 months ago 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

## 2024-04-25 ENCOUNTER — Other Ambulatory Visit: Payer: Self-pay | Admitting: Internal Medicine

## 2024-04-27 NOTE — Telephone Encounter (Signed)
 Requested medications are due for refill today.  yes  Requested medications are on the active medications list.  yes  Last refill. 03/29/2023 #30 1 rf  Future visit scheduled.   yes  Notes to clinic.  Refill not delegated.     Requested Prescriptions  Pending Prescriptions Disp Refills   zolpidem  (AMBIEN  CR) 12.5 MG CR tablet [Pharmacy Med Name: ZOLPIDEM  TART ER 12.5 MG TAB] 30 tablet 0    Sig: TAKE 1 TABLET BY MOUTH AT BEDTIME AS NEEDED FOR SLEEP.     Not Delegated - Psychiatry:  Anxiolytics/Hypnotics Failed - 04/27/2024  7:49 AM      Failed - This refill cannot be delegated      Failed - Urine Drug Screen completed in last 360 days      Passed - Valid encounter within last 6 months    Recent Outpatient Visits           4 months ago Encounter for general adult medical examination with abnormal findings   [place] [facility] [place], [place], NP   10 months ago Mild intermittent childhood asthma without complication   [place] [facility] [place], Angeline [place]N, [place]

## 2024-06-19 ENCOUNTER — Ambulatory Visit: Payer: Self-pay | Admitting: Internal Medicine
